# Patient Record
Sex: Female | Born: 1960 | Race: White | Hispanic: No | Smoking: Never smoker
Health system: Southern US, Community
[De-identification: ages and names within clinical notes are randomized; demographics above are authoritative.]

## PROBLEM LIST (undated history)

## (undated) DIAGNOSIS — F319 Bipolar disorder, unspecified: Secondary | ICD-10-CM

## (undated) DIAGNOSIS — K219 Gastro-esophageal reflux disease without esophagitis: Secondary | ICD-10-CM

## (undated) DIAGNOSIS — I10 Essential (primary) hypertension: Secondary | ICD-10-CM

## (undated) HISTORY — DX: Bipolar disorder, unspecified: F31.9

## (undated) HISTORY — DX: Essential (primary) hypertension: I10

## (undated) HISTORY — PX: NO PAST SURGERIES: SHX2092

## (undated) HISTORY — DX: Gastro-esophageal reflux disease without esophagitis: K21.9

---

## 1998-10-26 ENCOUNTER — Encounter: Payer: Self-pay | Admitting: Emergency Medicine

## 1998-10-26 ENCOUNTER — Emergency Department (HOSPITAL_COMMUNITY): Admission: EM | Admit: 1998-10-26 | Discharge: 1998-10-26 | Payer: Self-pay | Admitting: Emergency Medicine

## 1999-02-04 ENCOUNTER — Other Ambulatory Visit: Admission: RE | Admit: 1999-02-04 | Discharge: 1999-02-04 | Payer: Self-pay | Admitting: Obstetrics and Gynecology

## 2000-04-10 ENCOUNTER — Other Ambulatory Visit: Admission: RE | Admit: 2000-04-10 | Discharge: 2000-04-10 | Payer: Self-pay | Admitting: Obstetrics and Gynecology

## 2000-11-17 ENCOUNTER — Other Ambulatory Visit: Admission: RE | Admit: 2000-11-17 | Discharge: 2000-11-17 | Payer: Self-pay | Admitting: Obstetrics and Gynecology

## 2001-08-18 ENCOUNTER — Other Ambulatory Visit: Admission: RE | Admit: 2001-08-18 | Discharge: 2001-08-18 | Payer: Self-pay | Admitting: Obstetrics and Gynecology

## 2003-11-10 ENCOUNTER — Other Ambulatory Visit: Admission: RE | Admit: 2003-11-10 | Discharge: 2003-11-10 | Payer: Self-pay | Admitting: Obstetrics and Gynecology

## 2014-12-04 ENCOUNTER — Other Ambulatory Visit: Payer: Self-pay | Admitting: Family Medicine

## 2014-12-04 DIAGNOSIS — Z1231 Encounter for screening mammogram for malignant neoplasm of breast: Secondary | ICD-10-CM

## 2014-12-04 DIAGNOSIS — Z780 Asymptomatic menopausal state: Secondary | ICD-10-CM

## 2014-12-18 ENCOUNTER — Inpatient Hospital Stay
Admission: RE | Admit: 2014-12-18 | Discharge: 2014-12-18 | Disposition: A | Discharge status: Home / Self Care | Payer: Self-pay | Source: Ambulatory Visit | Attending: Family Medicine | Admitting: Family Medicine

## 2014-12-18 ENCOUNTER — Ambulatory Visit: Payer: Self-pay

## 2015-03-09 DIAGNOSIS — K5790 Diverticulosis of intestine, part unspecified, without perforation or abscess without bleeding: Secondary | ICD-10-CM | POA: Insufficient documentation

## 2016-07-21 DIAGNOSIS — I159 Secondary hypertension, unspecified: Secondary | ICD-10-CM | POA: Insufficient documentation

## 2016-07-21 DIAGNOSIS — J4530 Mild persistent asthma, uncomplicated: Secondary | ICD-10-CM | POA: Insufficient documentation

## 2016-07-21 DIAGNOSIS — I10 Essential (primary) hypertension: Secondary | ICD-10-CM | POA: Insufficient documentation

## 2017-02-05 DIAGNOSIS — I10 Essential (primary) hypertension: Secondary | ICD-10-CM | POA: Diagnosis not present

## 2017-02-05 DIAGNOSIS — M25561 Pain in right knee: Secondary | ICD-10-CM | POA: Diagnosis not present

## 2017-03-03 DIAGNOSIS — K219 Gastro-esophageal reflux disease without esophagitis: Secondary | ICD-10-CM | POA: Diagnosis not present

## 2017-03-03 DIAGNOSIS — I10 Essential (primary) hypertension: Secondary | ICD-10-CM | POA: Diagnosis not present

## 2017-03-03 DIAGNOSIS — D529 Folate deficiency anemia, unspecified: Secondary | ICD-10-CM | POA: Diagnosis not present

## 2017-03-03 DIAGNOSIS — Z6841 Body Mass Index (BMI) 40.0 and over, adult: Secondary | ICD-10-CM | POA: Diagnosis not present

## 2017-03-03 DIAGNOSIS — D649 Anemia, unspecified: Secondary | ICD-10-CM | POA: Diagnosis not present

## 2017-03-03 DIAGNOSIS — M25561 Pain in right knee: Secondary | ICD-10-CM | POA: Diagnosis not present

## 2017-03-03 DIAGNOSIS — D509 Iron deficiency anemia, unspecified: Secondary | ICD-10-CM | POA: Diagnosis not present

## 2017-03-03 DIAGNOSIS — R69 Illness, unspecified: Secondary | ICD-10-CM | POA: Diagnosis not present

## 2017-03-09 DIAGNOSIS — M25561 Pain in right knee: Secondary | ICD-10-CM | POA: Diagnosis not present

## 2017-03-12 DIAGNOSIS — M25561 Pain in right knee: Secondary | ICD-10-CM | POA: Diagnosis not present

## 2017-03-19 DIAGNOSIS — M25561 Pain in right knee: Secondary | ICD-10-CM | POA: Diagnosis not present

## 2017-03-25 DIAGNOSIS — M25561 Pain in right knee: Secondary | ICD-10-CM | POA: Diagnosis not present

## 2017-03-30 DIAGNOSIS — R69 Illness, unspecified: Secondary | ICD-10-CM | POA: Diagnosis not present

## 2017-03-30 DIAGNOSIS — K219 Gastro-esophageal reflux disease without esophagitis: Secondary | ICD-10-CM | POA: Diagnosis not present

## 2017-03-30 DIAGNOSIS — M25561 Pain in right knee: Secondary | ICD-10-CM | POA: Diagnosis not present

## 2017-04-01 DIAGNOSIS — Z1231 Encounter for screening mammogram for malignant neoplasm of breast: Secondary | ICD-10-CM | POA: Diagnosis not present

## 2017-04-01 DIAGNOSIS — Z124 Encounter for screening for malignant neoplasm of cervix: Secondary | ICD-10-CM | POA: Diagnosis not present

## 2017-04-01 DIAGNOSIS — Z0000 Encounter for general adult medical examination without abnormal findings: Secondary | ICD-10-CM | POA: Diagnosis not present

## 2017-04-01 DIAGNOSIS — R69 Illness, unspecified: Secondary | ICD-10-CM | POA: Diagnosis not present

## 2017-04-01 DIAGNOSIS — Z202 Contact with and (suspected) exposure to infections with a predominantly sexual mode of transmission: Secondary | ICD-10-CM | POA: Diagnosis not present

## 2017-04-02 DIAGNOSIS — M25561 Pain in right knee: Secondary | ICD-10-CM | POA: Diagnosis not present

## 2017-04-14 DIAGNOSIS — Z1231 Encounter for screening mammogram for malignant neoplasm of breast: Secondary | ICD-10-CM | POA: Diagnosis not present

## 2017-04-15 DIAGNOSIS — J0301 Acute recurrent streptococcal tonsillitis: Secondary | ICD-10-CM | POA: Diagnosis not present

## 2017-05-15 DIAGNOSIS — J00 Acute nasopharyngitis [common cold]: Secondary | ICD-10-CM | POA: Diagnosis not present

## 2017-05-18 DIAGNOSIS — G4709 Other insomnia: Secondary | ICD-10-CM | POA: Diagnosis not present

## 2017-05-18 DIAGNOSIS — J040 Acute laryngitis: Secondary | ICD-10-CM | POA: Diagnosis not present

## 2017-05-18 DIAGNOSIS — J4521 Mild intermittent asthma with (acute) exacerbation: Secondary | ICD-10-CM | POA: Diagnosis not present

## 2017-06-05 DIAGNOSIS — R69 Illness, unspecified: Secondary | ICD-10-CM | POA: Diagnosis not present

## 2017-06-05 DIAGNOSIS — G4709 Other insomnia: Secondary | ICD-10-CM | POA: Diagnosis not present

## 2017-06-08 DIAGNOSIS — R69 Illness, unspecified: Secondary | ICD-10-CM | POA: Diagnosis not present

## 2017-06-08 DIAGNOSIS — I10 Essential (primary) hypertension: Secondary | ICD-10-CM | POA: Diagnosis not present

## 2017-06-23 DIAGNOSIS — R69 Illness, unspecified: Secondary | ICD-10-CM | POA: Diagnosis not present

## 2017-07-09 DIAGNOSIS — R5383 Other fatigue: Secondary | ICD-10-CM | POA: Diagnosis not present

## 2017-07-09 DIAGNOSIS — S0003XA Contusion of scalp, initial encounter: Secondary | ICD-10-CM | POA: Diagnosis not present

## 2017-07-09 DIAGNOSIS — R0602 Shortness of breath: Secondary | ICD-10-CM | POA: Diagnosis not present

## 2017-07-09 DIAGNOSIS — R51 Headache: Secondary | ICD-10-CM | POA: Diagnosis not present

## 2017-07-09 DIAGNOSIS — S0990XA Unspecified injury of head, initial encounter: Secondary | ICD-10-CM | POA: Diagnosis not present

## 2017-07-09 DIAGNOSIS — G4751 Confusional arousals: Secondary | ICD-10-CM | POA: Diagnosis not present

## 2017-07-09 DIAGNOSIS — W01190A Fall on same level from slipping, tripping and stumbling with subsequent striking against furniture, initial encounter: Secondary | ICD-10-CM | POA: Diagnosis not present

## 2017-07-09 DIAGNOSIS — R69 Illness, unspecified: Secondary | ICD-10-CM | POA: Diagnosis not present

## 2017-07-09 DIAGNOSIS — S0101XA Laceration without foreign body of scalp, initial encounter: Secondary | ICD-10-CM | POA: Diagnosis not present

## 2017-07-09 DIAGNOSIS — R55 Syncope and collapse: Secondary | ICD-10-CM | POA: Diagnosis not present

## 2017-07-09 DIAGNOSIS — R531 Weakness: Secondary | ICD-10-CM | POA: Diagnosis not present

## 2017-07-09 DIAGNOSIS — W01198A Fall on same level from slipping, tripping and stumbling with subsequent striking against other object, initial encounter: Secondary | ICD-10-CM | POA: Diagnosis not present

## 2017-07-09 DIAGNOSIS — I10 Essential (primary) hypertension: Secondary | ICD-10-CM | POA: Diagnosis not present

## 2017-07-09 DIAGNOSIS — S0181XA Laceration without foreign body of other part of head, initial encounter: Secondary | ICD-10-CM | POA: Diagnosis not present

## 2017-07-10 DIAGNOSIS — D528 Other folate deficiency anemias: Secondary | ICD-10-CM | POA: Diagnosis not present

## 2017-07-10 DIAGNOSIS — R42 Dizziness and giddiness: Secondary | ICD-10-CM | POA: Diagnosis not present

## 2017-07-10 DIAGNOSIS — R69 Illness, unspecified: Secondary | ICD-10-CM | POA: Diagnosis not present

## 2017-07-10 DIAGNOSIS — G4739 Other sleep apnea: Secondary | ICD-10-CM | POA: Diagnosis not present

## 2017-07-10 DIAGNOSIS — S0003XD Contusion of scalp, subsequent encounter: Secondary | ICD-10-CM | POA: Diagnosis not present

## 2017-07-10 DIAGNOSIS — D508 Other iron deficiency anemias: Secondary | ICD-10-CM | POA: Diagnosis not present

## 2017-07-21 DIAGNOSIS — X58XXXD Exposure to other specified factors, subsequent encounter: Secondary | ICD-10-CM | POA: Diagnosis not present

## 2017-07-21 DIAGNOSIS — S0003XD Contusion of scalp, subsequent encounter: Secondary | ICD-10-CM | POA: Diagnosis not present

## 2017-07-21 DIAGNOSIS — R42 Dizziness and giddiness: Secondary | ICD-10-CM | POA: Diagnosis not present

## 2017-07-31 DIAGNOSIS — R69 Illness, unspecified: Secondary | ICD-10-CM | POA: Diagnosis not present

## 2017-08-06 DIAGNOSIS — G4733 Obstructive sleep apnea (adult) (pediatric): Secondary | ICD-10-CM | POA: Diagnosis not present

## 2017-08-06 DIAGNOSIS — G4739 Other sleep apnea: Secondary | ICD-10-CM | POA: Diagnosis not present

## 2017-08-31 DIAGNOSIS — R630 Anorexia: Secondary | ICD-10-CM | POA: Diagnosis not present

## 2017-08-31 DIAGNOSIS — Z23 Encounter for immunization: Secondary | ICD-10-CM | POA: Diagnosis not present

## 2017-08-31 DIAGNOSIS — R69 Illness, unspecified: Secondary | ICD-10-CM | POA: Diagnosis not present

## 2017-09-21 DIAGNOSIS — Z79899 Other long term (current) drug therapy: Secondary | ICD-10-CM | POA: Diagnosis not present

## 2017-09-21 DIAGNOSIS — N921 Excessive and frequent menstruation with irregular cycle: Secondary | ICD-10-CM | POA: Diagnosis not present

## 2017-09-21 DIAGNOSIS — R69 Illness, unspecified: Secondary | ICD-10-CM | POA: Diagnosis not present

## 2017-09-21 DIAGNOSIS — R5383 Other fatigue: Secondary | ICD-10-CM | POA: Diagnosis not present

## 2017-09-29 DIAGNOSIS — N920 Excessive and frequent menstruation with regular cycle: Secondary | ICD-10-CM | POA: Diagnosis not present

## 2017-09-29 DIAGNOSIS — N888 Other specified noninflammatory disorders of cervix uteri: Secondary | ICD-10-CM | POA: Diagnosis not present

## 2017-09-29 DIAGNOSIS — N921 Excessive and frequent menstruation with irregular cycle: Secondary | ICD-10-CM | POA: Diagnosis not present

## 2017-10-12 DIAGNOSIS — R69 Illness, unspecified: Secondary | ICD-10-CM | POA: Diagnosis not present

## 2017-10-19 DIAGNOSIS — G4733 Obstructive sleep apnea (adult) (pediatric): Secondary | ICD-10-CM | POA: Diagnosis not present

## 2017-11-11 DIAGNOSIS — I10 Essential (primary) hypertension: Secondary | ICD-10-CM | POA: Diagnosis not present

## 2017-11-11 DIAGNOSIS — R69 Illness, unspecified: Secondary | ICD-10-CM | POA: Diagnosis not present

## 2017-11-11 DIAGNOSIS — Z79899 Other long term (current) drug therapy: Secondary | ICD-10-CM | POA: Diagnosis not present

## 2017-11-18 DIAGNOSIS — G4733 Obstructive sleep apnea (adult) (pediatric): Secondary | ICD-10-CM | POA: Diagnosis not present

## 2017-11-23 DIAGNOSIS — G4733 Obstructive sleep apnea (adult) (pediatric): Secondary | ICD-10-CM | POA: Diagnosis not present

## 2017-12-17 DIAGNOSIS — R69 Illness, unspecified: Secondary | ICD-10-CM | POA: Diagnosis not present

## 2017-12-17 DIAGNOSIS — G4739 Other sleep apnea: Secondary | ICD-10-CM | POA: Diagnosis not present

## 2017-12-17 DIAGNOSIS — I10 Essential (primary) hypertension: Secondary | ICD-10-CM | POA: Diagnosis not present

## 2017-12-19 DIAGNOSIS — G4733 Obstructive sleep apnea (adult) (pediatric): Secondary | ICD-10-CM | POA: Diagnosis not present

## 2018-01-14 DIAGNOSIS — R69 Illness, unspecified: Secondary | ICD-10-CM | POA: Diagnosis not present

## 2018-01-19 DIAGNOSIS — G4733 Obstructive sleep apnea (adult) (pediatric): Secondary | ICD-10-CM | POA: Diagnosis not present

## 2018-01-27 DIAGNOSIS — R69 Illness, unspecified: Secondary | ICD-10-CM | POA: Diagnosis not present

## 2018-02-11 DIAGNOSIS — R69 Illness, unspecified: Secondary | ICD-10-CM | POA: Diagnosis not present

## 2018-02-16 DIAGNOSIS — G4733 Obstructive sleep apnea (adult) (pediatric): Secondary | ICD-10-CM | POA: Diagnosis not present

## 2018-02-19 DIAGNOSIS — Z602 Problems related to living alone: Secondary | ICD-10-CM | POA: Diagnosis not present

## 2018-02-19 DIAGNOSIS — R69 Illness, unspecified: Secondary | ICD-10-CM | POA: Diagnosis not present

## 2018-02-19 DIAGNOSIS — R7303 Prediabetes: Secondary | ICD-10-CM | POA: Diagnosis not present

## 2018-02-19 DIAGNOSIS — I10 Essential (primary) hypertension: Secondary | ICD-10-CM | POA: Diagnosis not present

## 2018-02-20 DIAGNOSIS — I10 Essential (primary) hypertension: Secondary | ICD-10-CM | POA: Diagnosis not present

## 2018-02-20 DIAGNOSIS — R7303 Prediabetes: Secondary | ICD-10-CM | POA: Diagnosis not present

## 2018-02-20 DIAGNOSIS — Z602 Problems related to living alone: Secondary | ICD-10-CM | POA: Diagnosis not present

## 2018-02-20 DIAGNOSIS — R69 Illness, unspecified: Secondary | ICD-10-CM | POA: Diagnosis not present

## 2018-02-22 DIAGNOSIS — R7303 Prediabetes: Secondary | ICD-10-CM | POA: Diagnosis not present

## 2018-02-22 DIAGNOSIS — I10 Essential (primary) hypertension: Secondary | ICD-10-CM | POA: Diagnosis not present

## 2018-02-22 DIAGNOSIS — Z602 Problems related to living alone: Secondary | ICD-10-CM | POA: Diagnosis not present

## 2018-02-22 DIAGNOSIS — R69 Illness, unspecified: Secondary | ICD-10-CM | POA: Diagnosis not present

## 2018-02-23 DIAGNOSIS — Z602 Problems related to living alone: Secondary | ICD-10-CM | POA: Diagnosis not present

## 2018-02-23 DIAGNOSIS — R69 Illness, unspecified: Secondary | ICD-10-CM | POA: Diagnosis not present

## 2018-02-23 DIAGNOSIS — I10 Essential (primary) hypertension: Secondary | ICD-10-CM | POA: Diagnosis not present

## 2018-02-23 DIAGNOSIS — R7303 Prediabetes: Secondary | ICD-10-CM | POA: Diagnosis not present

## 2018-02-24 DIAGNOSIS — R7303 Prediabetes: Secondary | ICD-10-CM | POA: Diagnosis not present

## 2018-02-24 DIAGNOSIS — I10 Essential (primary) hypertension: Secondary | ICD-10-CM | POA: Diagnosis not present

## 2018-02-24 DIAGNOSIS — R69 Illness, unspecified: Secondary | ICD-10-CM | POA: Diagnosis not present

## 2018-02-24 DIAGNOSIS — Z602 Problems related to living alone: Secondary | ICD-10-CM | POA: Diagnosis not present

## 2018-02-25 DIAGNOSIS — Z602 Problems related to living alone: Secondary | ICD-10-CM | POA: Diagnosis not present

## 2018-02-25 DIAGNOSIS — R69 Illness, unspecified: Secondary | ICD-10-CM | POA: Diagnosis not present

## 2018-02-25 DIAGNOSIS — R7303 Prediabetes: Secondary | ICD-10-CM | POA: Diagnosis not present

## 2018-02-25 DIAGNOSIS — I10 Essential (primary) hypertension: Secondary | ICD-10-CM | POA: Diagnosis not present

## 2018-02-26 DIAGNOSIS — I10 Essential (primary) hypertension: Secondary | ICD-10-CM | POA: Diagnosis not present

## 2018-02-26 DIAGNOSIS — R69 Illness, unspecified: Secondary | ICD-10-CM | POA: Diagnosis not present

## 2018-02-26 DIAGNOSIS — R7303 Prediabetes: Secondary | ICD-10-CM | POA: Diagnosis not present

## 2018-02-26 DIAGNOSIS — Z602 Problems related to living alone: Secondary | ICD-10-CM | POA: Diagnosis not present

## 2018-03-01 DIAGNOSIS — R69 Illness, unspecified: Secondary | ICD-10-CM | POA: Diagnosis not present

## 2018-03-01 DIAGNOSIS — I10 Essential (primary) hypertension: Secondary | ICD-10-CM | POA: Diagnosis not present

## 2018-03-01 DIAGNOSIS — R7303 Prediabetes: Secondary | ICD-10-CM | POA: Diagnosis not present

## 2018-03-01 DIAGNOSIS — Z602 Problems related to living alone: Secondary | ICD-10-CM | POA: Diagnosis not present

## 2018-03-02 DIAGNOSIS — Z602 Problems related to living alone: Secondary | ICD-10-CM | POA: Diagnosis not present

## 2018-03-02 DIAGNOSIS — I10 Essential (primary) hypertension: Secondary | ICD-10-CM | POA: Diagnosis not present

## 2018-03-02 DIAGNOSIS — R69 Illness, unspecified: Secondary | ICD-10-CM | POA: Diagnosis not present

## 2018-03-02 DIAGNOSIS — R7303 Prediabetes: Secondary | ICD-10-CM | POA: Diagnosis not present

## 2018-03-03 DIAGNOSIS — I10 Essential (primary) hypertension: Secondary | ICD-10-CM | POA: Diagnosis not present

## 2018-03-03 DIAGNOSIS — R69 Illness, unspecified: Secondary | ICD-10-CM | POA: Diagnosis not present

## 2018-03-03 DIAGNOSIS — R7303 Prediabetes: Secondary | ICD-10-CM | POA: Diagnosis not present

## 2018-03-03 DIAGNOSIS — Z602 Problems related to living alone: Secondary | ICD-10-CM | POA: Diagnosis not present

## 2018-03-10 DIAGNOSIS — R69 Illness, unspecified: Secondary | ICD-10-CM | POA: Diagnosis not present

## 2018-03-19 DIAGNOSIS — G4733 Obstructive sleep apnea (adult) (pediatric): Secondary | ICD-10-CM | POA: Diagnosis not present

## 2018-03-24 DIAGNOSIS — R69 Illness, unspecified: Secondary | ICD-10-CM | POA: Diagnosis not present

## 2018-03-25 DIAGNOSIS — R69 Illness, unspecified: Secondary | ICD-10-CM | POA: Diagnosis not present

## 2018-04-18 DIAGNOSIS — G4733 Obstructive sleep apnea (adult) (pediatric): Secondary | ICD-10-CM | POA: Diagnosis not present

## 2018-04-21 DIAGNOSIS — G4733 Obstructive sleep apnea (adult) (pediatric): Secondary | ICD-10-CM | POA: Diagnosis not present

## 2018-05-12 ENCOUNTER — Ambulatory Visit (INDEPENDENT_AMBULATORY_CARE_PROVIDER_SITE_OTHER): Payer: Medicare HMO | Admitting: Psychology

## 2018-05-12 DIAGNOSIS — F314 Bipolar disorder, current episode depressed, severe, without psychotic features: Secondary | ICD-10-CM | POA: Diagnosis not present

## 2018-05-12 DIAGNOSIS — R69 Illness, unspecified: Secondary | ICD-10-CM | POA: Diagnosis not present

## 2018-05-19 DIAGNOSIS — G4733 Obstructive sleep apnea (adult) (pediatric): Secondary | ICD-10-CM | POA: Diagnosis not present

## 2018-05-19 DIAGNOSIS — R69 Illness, unspecified: Secondary | ICD-10-CM | POA: Diagnosis not present

## 2018-05-20 ENCOUNTER — Ambulatory Visit: Payer: Medicare HMO | Admitting: Psychology

## 2018-05-26 DIAGNOSIS — G4733 Obstructive sleep apnea (adult) (pediatric): Secondary | ICD-10-CM | POA: Diagnosis not present

## 2018-05-27 ENCOUNTER — Ambulatory Visit (INDEPENDENT_AMBULATORY_CARE_PROVIDER_SITE_OTHER): Payer: Medicare HMO | Admitting: Psychology

## 2018-05-27 DIAGNOSIS — R69 Illness, unspecified: Secondary | ICD-10-CM | POA: Diagnosis not present

## 2018-05-27 DIAGNOSIS — F314 Bipolar disorder, current episode depressed, severe, without psychotic features: Secondary | ICD-10-CM | POA: Diagnosis not present

## 2018-06-03 ENCOUNTER — Ambulatory Visit (INDEPENDENT_AMBULATORY_CARE_PROVIDER_SITE_OTHER): Payer: Medicare HMO | Admitting: Psychology

## 2018-06-03 DIAGNOSIS — F314 Bipolar disorder, current episode depressed, severe, without psychotic features: Secondary | ICD-10-CM

## 2018-06-03 DIAGNOSIS — R69 Illness, unspecified: Secondary | ICD-10-CM | POA: Diagnosis not present

## 2018-06-11 ENCOUNTER — Ambulatory Visit: Payer: Medicare HMO | Admitting: Psychology

## 2018-06-18 ENCOUNTER — Ambulatory Visit: Payer: Self-pay | Admitting: Psychology

## 2018-06-18 DIAGNOSIS — G4733 Obstructive sleep apnea (adult) (pediatric): Secondary | ICD-10-CM | POA: Diagnosis not present

## 2018-06-24 DIAGNOSIS — R69 Illness, unspecified: Secondary | ICD-10-CM | POA: Diagnosis not present

## 2018-07-19 DIAGNOSIS — G4733 Obstructive sleep apnea (adult) (pediatric): Secondary | ICD-10-CM | POA: Diagnosis not present

## 2018-08-19 DIAGNOSIS — G4733 Obstructive sleep apnea (adult) (pediatric): Secondary | ICD-10-CM | POA: Diagnosis not present

## 2018-10-07 DIAGNOSIS — G4733 Obstructive sleep apnea (adult) (pediatric): Secondary | ICD-10-CM | POA: Diagnosis not present

## 2018-11-25 DIAGNOSIS — Z23 Encounter for immunization: Secondary | ICD-10-CM | POA: Diagnosis not present

## 2018-12-15 DIAGNOSIS — Z6841 Body Mass Index (BMI) 40.0 and over, adult: Secondary | ICD-10-CM | POA: Diagnosis not present

## 2018-12-15 DIAGNOSIS — I10 Essential (primary) hypertension: Secondary | ICD-10-CM | POA: Diagnosis not present

## 2018-12-15 DIAGNOSIS — R42 Dizziness and giddiness: Secondary | ICD-10-CM | POA: Diagnosis not present

## 2018-12-15 DIAGNOSIS — R69 Illness, unspecified: Secondary | ICD-10-CM | POA: Diagnosis not present

## 2018-12-30 DIAGNOSIS — Z1231 Encounter for screening mammogram for malignant neoplasm of breast: Secondary | ICD-10-CM | POA: Diagnosis not present

## 2018-12-30 DIAGNOSIS — Z0000 Encounter for general adult medical examination without abnormal findings: Secondary | ICD-10-CM | POA: Diagnosis not present

## 2018-12-30 DIAGNOSIS — Z6841 Body Mass Index (BMI) 40.0 and over, adult: Secondary | ICD-10-CM | POA: Diagnosis not present

## 2019-01-25 DIAGNOSIS — Z1231 Encounter for screening mammogram for malignant neoplasm of breast: Secondary | ICD-10-CM | POA: Diagnosis not present

## 2019-01-25 LAB — HM MAMMOGRAPHY: HM Mammogram: NORMAL (ref 0–4)

## 2019-03-01 DIAGNOSIS — L247 Irritant contact dermatitis due to plants, except food: Secondary | ICD-10-CM | POA: Diagnosis not present

## 2019-03-01 DIAGNOSIS — R601 Generalized edema: Secondary | ICD-10-CM | POA: Diagnosis not present

## 2019-04-28 DIAGNOSIS — G4733 Obstructive sleep apnea (adult) (pediatric): Secondary | ICD-10-CM | POA: Diagnosis not present

## 2019-07-03 ENCOUNTER — Emergency Department (HOSPITAL_COMMUNITY): Payer: Medicare HMO

## 2019-07-03 ENCOUNTER — Emergency Department (HOSPITAL_COMMUNITY)
Admission: EM | Admit: 2019-07-03 | Discharge: 2019-07-05 | Disposition: A | Discharge status: Other Acute Inpatient Hospital | Payer: Medicare HMO | Attending: Emergency Medicine | Admitting: Emergency Medicine

## 2019-07-03 DIAGNOSIS — H9190 Unspecified hearing loss, unspecified ear: Secondary | ICD-10-CM | POA: Insufficient documentation

## 2019-07-03 DIAGNOSIS — R4182 Altered mental status, unspecified: Secondary | ICD-10-CM | POA: Diagnosis not present

## 2019-07-03 DIAGNOSIS — R404 Transient alteration of awareness: Secondary | ICD-10-CM | POA: Diagnosis not present

## 2019-07-03 DIAGNOSIS — I639 Cerebral infarction, unspecified: Secondary | ICD-10-CM | POA: Diagnosis not present

## 2019-07-03 DIAGNOSIS — F309 Manic episode, unspecified: Secondary | ICD-10-CM | POA: Insufficient documentation

## 2019-07-03 DIAGNOSIS — R479 Unspecified speech disturbances: Secondary | ICD-10-CM

## 2019-07-03 DIAGNOSIS — G9340 Encephalopathy, unspecified: Secondary | ICD-10-CM | POA: Diagnosis not present

## 2019-07-03 DIAGNOSIS — J45909 Unspecified asthma, uncomplicated: Secondary | ICD-10-CM | POA: Insufficient documentation

## 2019-07-03 DIAGNOSIS — I10 Essential (primary) hypertension: Secondary | ICD-10-CM | POA: Insufficient documentation

## 2019-07-03 DIAGNOSIS — R4701 Aphasia: Secondary | ICD-10-CM | POA: Diagnosis not present

## 2019-07-03 DIAGNOSIS — R000 Tachycardia, unspecified: Secondary | ICD-10-CM | POA: Diagnosis not present

## 2019-07-03 DIAGNOSIS — R69 Illness, unspecified: Secondary | ICD-10-CM | POA: Diagnosis not present

## 2019-07-03 DIAGNOSIS — R4781 Slurred speech: Secondary | ICD-10-CM | POA: Diagnosis not present

## 2019-07-03 LAB — DIFFERENTIAL
Abs Immature Granulocytes: 0.03 10*3/uL (ref 0.00–0.07)
Basophils Absolute: 0.1 10*3/uL (ref 0.0–0.1)
Basophils Relative: 1 %
Eosinophils Absolute: 0 10*3/uL (ref 0.0–0.5)
Eosinophils Relative: 0 %
Immature Granulocytes: 0 %
Lymphocytes Relative: 19 %
Lymphs Abs: 1.7 10*3/uL (ref 0.7–4.0)
Monocytes Absolute: 0.6 10*3/uL (ref 0.1–1.0)
Monocytes Relative: 7 %
Neutro Abs: 6.3 10*3/uL (ref 1.7–7.7)
Neutrophils Relative %: 73 %

## 2019-07-03 LAB — CBC
HCT: 40.8 % (ref 36.0–46.0)
Hemoglobin: 13.4 g/dL (ref 12.0–15.0)
MCH: 29 pg (ref 26.0–34.0)
MCHC: 32.8 g/dL (ref 30.0–36.0)
MCV: 88.3 fL (ref 80.0–100.0)
Platelets: 381 10*3/uL (ref 150–400)
RBC: 4.62 MIL/uL (ref 3.87–5.11)
RDW: 13.5 % (ref 11.5–15.5)
WBC: 8.7 10*3/uL (ref 4.0–10.5)
nRBC: 0 % (ref 0.0–0.2)

## 2019-07-03 LAB — COMPREHENSIVE METABOLIC PANEL
ALT: 22 U/L (ref 0–44)
AST: 26 U/L (ref 15–41)
Albumin: 3.9 g/dL (ref 3.5–5.0)
Alkaline Phosphatase: 59 U/L (ref 38–126)
Anion gap: 14 (ref 5–15)
BUN: 21 mg/dL — ABNORMAL HIGH (ref 6–20)
CO2: 24 mmol/L (ref 22–32)
Calcium: 9.8 mg/dL (ref 8.9–10.3)
Chloride: 105 mmol/L (ref 98–111)
Creatinine, Ser: 1.19 mg/dL — ABNORMAL HIGH (ref 0.44–1.00)
GFR calc Af Amer: 60 mL/min — ABNORMAL LOW (ref >60)
GFR calc non Af Amer: 51 mL/min — ABNORMAL LOW (ref >60)
Glucose, Bld: 132 mg/dL — ABNORMAL HIGH (ref 70–99)
Potassium: 3 mmol/L — ABNORMAL LOW (ref 3.5–5.1)
Sodium: 143 mmol/L (ref 135–145)
Total Bilirubin: 1.3 mg/dL — ABNORMAL HIGH (ref 0.3–1.2)
Total Protein: 7.3 g/dL (ref 6.5–8.1)

## 2019-07-03 LAB — URINALYSIS, ROUTINE W REFLEX MICROSCOPIC
Bacteria, UA: NONE SEEN
Bilirubin Urine: NEGATIVE
Glucose, UA: NEGATIVE mg/dL
Hgb urine dipstick: NEGATIVE
Ketones, ur: 20 mg/dL — AB
Leukocytes,Ua: NEGATIVE
Nitrite: NEGATIVE
Protein, ur: 30 mg/dL — AB
Specific Gravity, Urine: 1.028 (ref 1.005–1.030)
pH: 5 (ref 5.0–8.0)

## 2019-07-03 LAB — ETHANOL: Alcohol, Ethyl (B): <10 mg/dL (ref <10)

## 2019-07-03 LAB — PROTIME-INR
INR: 1 (ref 0.8–1.2)
Prothrombin Time: 13.1 seconds (ref 11.4–15.2)

## 2019-07-03 LAB — RAPID URINE DRUG SCREEN, HOSP PERFORMED
Amphetamines: NOT DETECTED
Barbiturates: NOT DETECTED
Benzodiazepines: NOT DETECTED
Cocaine: NOT DETECTED
Opiates: NOT DETECTED
Tetrahydrocannabinol: NOT DETECTED

## 2019-07-03 LAB — AMMONIA: Ammonia: 30 umol/L (ref 9–35)

## 2019-07-03 LAB — I-STAT CHEM 8, ED
BUN: 22 mg/dL — ABNORMAL HIGH (ref 6–20)
Calcium, Ion: 1.1 mmol/L — ABNORMAL LOW (ref 1.15–1.40)
Chloride: 104 mmol/L (ref 98–111)
Creatinine, Ser: 1.1 mg/dL — ABNORMAL HIGH (ref 0.44–1.00)
Glucose, Bld: 130 mg/dL — ABNORMAL HIGH (ref 70–99)
HCT: 40 % (ref 36.0–46.0)
Hemoglobin: 13.6 g/dL (ref 12.0–15.0)
Potassium: 3 mmol/L — ABNORMAL LOW (ref 3.5–5.1)
Sodium: 141 mmol/L (ref 135–145)
TCO2: 26 mmol/L (ref 22–32)

## 2019-07-03 LAB — CBG MONITORING, ED: Glucose-Capillary: 129 mg/dL — ABNORMAL HIGH (ref 70–99)

## 2019-07-03 LAB — I-STAT BETA HCG BLOOD, ED (MC, WL, AP ONLY): I-stat hCG, quantitative: <5 m[IU]/mL (ref <5)

## 2019-07-03 LAB — MAGNESIUM: Magnesium: 2.1 mg/dL (ref 1.7–2.4)

## 2019-07-03 LAB — TSH: TSH: 1.043 u[IU]/mL (ref 0.350–4.500)

## 2019-07-03 LAB — APTT: aPTT: 28 seconds (ref 24–36)

## 2019-07-03 MED ORDER — POTASSIUM CHLORIDE CRYS ER 20 MEQ PO TBCR
40.0000 meq | EXTENDED_RELEASE_TABLET | Freq: Once | ORAL | Status: AC
  Administered 2019-07-03: 40 meq via ORAL
  Filled 2019-07-03: qty 2

## 2019-07-03 MED ORDER — POTASSIUM CHLORIDE CRYS ER 20 MEQ PO TBCR
20.0000 meq | EXTENDED_RELEASE_TABLET | Freq: Every day | ORAL | Status: AC
  Administered 2019-07-04 – 2019-07-05 (×2): 20 meq via ORAL
  Filled 2019-07-03 (×3): qty 1

## 2019-07-03 MED ORDER — LORAZEPAM 2 MG/ML IJ SOLN
2.0000 mg | Freq: Once | INTRAMUSCULAR | Status: AC
  Administered 2019-07-03: 2 mg via INTRAVENOUS
  Filled 2019-07-03: qty 1

## 2019-07-03 MED ORDER — HYDROMORPHONE HCL 1 MG/ML IJ SOLN
1.0000 mg | Freq: Once | INTRAMUSCULAR | Status: DC
  Filled 2019-07-03: qty 1

## 2019-07-03 MED ORDER — SODIUM CHLORIDE 0.9 % IV BOLUS
1000.0000 mL | Freq: Once | INTRAVENOUS | Status: AC
  Administered 2019-07-03: 1000 mL via INTRAVENOUS

## 2019-07-03 MED ORDER — SODIUM CHLORIDE 0.9% FLUSH
3.0000 mL | Freq: Once | INTRAVENOUS | Status: DC
Start: 2019-07-03 — End: 2019-07-05

## 2019-07-03 MED ORDER — HYDROMORPHONE HCL 1 MG/ML IJ SOLN: 0.5000 mg | Freq: Once | INTRAMUSCULAR | Status: DC

## 2019-07-03 MED ORDER — POTASSIUM CHLORIDE 10 MEQ/100ML IV SOLN
10.0000 meq | INTRAVENOUS | Status: AC
  Administered 2019-07-03: 10 meq via INTRAVENOUS
  Filled 2019-07-03: qty 100

## 2019-07-03 MED ORDER — LACTATED RINGERS IV BOLUS
1000.0000 mL | Freq: Once | INTRAVENOUS | Status: AC
  Administered 2019-07-03: 1000 mL via INTRAVENOUS

## 2019-07-03 MED ORDER — POTASSIUM CHLORIDE 10 MEQ/100ML IV SOLN
10.0000 meq | Freq: Once | INTRAVENOUS | Status: AC
  Administered 2019-07-04: 10 meq via INTRAVENOUS
  Filled 2019-07-03: qty 100

## 2019-07-03 NOTE — ED Notes (Signed)
[phone] pts daughter Rip Harbour, had a missed call wanting a pt update

## 2019-07-03 NOTE — Progress Notes (Signed)
Brought patient to MRI department for scan, patient at this time says she cannot do MRI. She is having bad cramps in right leg and kept saying she doesn't want to do this right. Informed RN, patient sent back to room

## 2019-07-03 NOTE — ED Notes (Signed)
Pt is not in room at this moment. Will return for blood draw.

## 2019-07-03 NOTE — ED Notes (Signed)
Called main lab to add RPR, and Mag
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
## 2019-07-03 NOTE — ED Notes (Signed)
Patient continuously removing self from monitor.

## 2019-07-04 ENCOUNTER — Other Ambulatory Visit: Payer: Self-pay

## 2019-07-04 LAB — RPR: RPR Ser Ql: NONREACTIVE

## 2019-07-04 NOTE — ED Notes (Signed)
Daughter updated on phone. States pts PCP Metta Clines [phone]

## 2019-07-04 NOTE — ED Notes (Signed)
Pt daughter Bradly Chris  [phone]

## 2019-07-04 NOTE — ED Notes (Signed)
Called staffing about a Air cabin crew and they said that they wouldn't have one for the whole day.

## 2019-07-04 NOTE — ED Notes (Signed)
Pt daughter Monica Martinez [phone]

## 2019-07-04 NOTE — BH Assessment (Signed)
Cynthia [name] Assessment Progress Note This Probation officer spoke with patient this date to evlaute current mental health status. Patient presents with a disorganized affect and is not oriented to time or place. Patient is speaking incoherently and is difficult to redirect. Case was staffed with Marcello Moores NP recommended a continued inpatient admission.
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
## 2019-07-04 NOTE — Progress Notes (Addendum)
MRI brain completed: 1. No acute intracranial abnormality identified. 2. Empty sella. While this finding is often incidental finding of no clinical significance, this can also be seen in the setting of elevated intracranial pressures (pseudotumor cerebri). 3. Otherwise unremarkable and normal brain MRI.  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 MRI brain is negative for stroke 2. Recommend psychiatry/psychology evaluations.  3. Neurology will sign off. Please call if there are additional questions.  Electronically signed: Dr. Kerney Elbe

## 2019-07-04 NOTE — Progress Notes (Signed)
Pt meets inpatient criteria per Mordecai Maes, NP. Referral information has been sent to the following hospitals for review:  [facility]  Disposition will continue to follow for inpatient placement needs.   Audree Camel, LCSW, [place] Disposition [facility]/TTS [phone]

## 2019-07-04 NOTE — ED Notes (Signed)
Pt is restless, moving in and out of bed. Redirected back to bed.

## 2019-07-04 NOTE — ED Notes (Signed)
Dinner tray ordered.

## 2019-07-04 NOTE — ED Notes (Signed)
Breakfast ordered 

## 2019-07-05 DIAGNOSIS — Z9114 Patient's other noncompliance with medication regimen: Secondary | ICD-10-CM | POA: Diagnosis not present

## 2019-07-05 DIAGNOSIS — I10 Essential (primary) hypertension: Secondary | ICD-10-CM | POA: Diagnosis not present

## 2019-07-05 DIAGNOSIS — F309 Manic episode, unspecified: Secondary | ICD-10-CM | POA: Diagnosis not present

## 2019-07-05 DIAGNOSIS — R69 Illness, unspecified: Secondary | ICD-10-CM | POA: Diagnosis not present

## 2019-07-05 MED ORDER — ZIPRASIDONE MESYLATE 20 MG IM SOLR
20.0000 mg | Freq: Once | INTRAMUSCULAR | Status: AC
  Administered 2019-07-05: 20 mg via INTRAMUSCULAR
  Filled 2019-07-05: qty 20

## 2019-07-05 NOTE — ED Notes (Signed)
Pt wandering in hall; stating she is looking for the exit. Pt provided with a recliner chair and reminded that she has to stay to speak with dr in a.m. Pt redirected. Will continue to monitor.

## 2019-07-05 NOTE — ED Provider Notes (Addendum)
IVC has been placed.  Patient recommended for inpatient psychiatric treatment.  Patient with new manic behavior.  Likely has underlying bipolar.  To be admitted at [facility].  Patient stable throughout my care.  Excepted to [facility] by Dr. Versie Starks. Transferred in good condition.   Lennice Sites, DO 07/05/19 Jane Lew, [place], DO 07/05/19 1343

## 2019-07-05 NOTE — ED Notes (Signed)
Patient has attempted to walk to ambulance bay exit a couple of times, wanders in the hall, etc. Pt advised to return to seat, again.

## 2019-07-05 NOTE — ED Notes (Signed)
Called [facility] transport to be [facility]

## 2019-07-05 NOTE — ED Notes (Signed)
Pt. Will continue to repeat whatever she hears and sees. Have provided crayons and 3 pieces of paper to stay occupied.

## 2019-07-05 NOTE — Progress Notes (Signed)
[facility] Romie Jumper, MD is the accepting/attending provider   Call report to [phone] [facility] ED notified.   Pt is to be IVC'd prior to transport as she has been attempting to elope. PLEASE FAX IVC TO [facility] @[phone]  Pt may be transported by [employer] Pt scheduled  to arrive at [facility] as soon as IVC is served and transport arranged.  Areatha Keas. Judi Cong, MSW, [place] Disposition Clinical Social Work [phone] (cell) [phone] (office)

## 2019-07-05 NOTE — ED Notes (Signed)
Patient provided with paper and crayons to keep busy/distracted from wandering with no success.

## 2019-07-05 NOTE — ED Notes (Signed)
Pt resting with eyes closed; placed on ED stretcher for comfort.

## 2019-07-05 NOTE — ED Notes (Signed)
Pts breakfast has arrived. Will continue to monitor pt 

## 2019-07-05 NOTE — ED Notes (Signed)
Patient awake; wandering up the hall. Redirected back to bed

## 2019-07-05 NOTE — ED Notes (Signed)
Pt. Began crying. When asked what was wrong, she raised her hands to cover her ears and said "amber alert". Will continue to monitor pt.

## 2019-07-14 DIAGNOSIS — R69 Illness, unspecified: Secondary | ICD-10-CM | POA: Diagnosis not present

## 2019-07-20 DIAGNOSIS — R69 Illness, unspecified: Secondary | ICD-10-CM | POA: Diagnosis not present

## 2019-07-27 DIAGNOSIS — R600 Localized edema: Secondary | ICD-10-CM | POA: Diagnosis not present

## 2019-08-22 DIAGNOSIS — R69 Illness, unspecified: Secondary | ICD-10-CM | POA: Diagnosis not present

## 2019-08-22 DIAGNOSIS — R600 Localized edema: Secondary | ICD-10-CM | POA: Diagnosis not present

## 2019-08-22 DIAGNOSIS — Z23 Encounter for immunization: Secondary | ICD-10-CM | POA: Diagnosis not present

## 2019-08-25 DIAGNOSIS — R7301 Impaired fasting glucose: Secondary | ICD-10-CM | POA: Diagnosis not present

## 2019-09-15 DIAGNOSIS — R69 Illness, unspecified: Secondary | ICD-10-CM | POA: Diagnosis not present

## 2019-10-05 DIAGNOSIS — R69 Illness, unspecified: Secondary | ICD-10-CM | POA: Diagnosis not present

## 2019-10-25 DIAGNOSIS — R69 Illness, unspecified: Secondary | ICD-10-CM | POA: Diagnosis not present

## 2019-12-13 DIAGNOSIS — R69 Illness, unspecified: Secondary | ICD-10-CM | POA: Diagnosis not present

## 2020-01-03 DIAGNOSIS — Z6841 Body Mass Index (BMI) 40.0 and over, adult: Secondary | ICD-10-CM | POA: Diagnosis not present

## 2020-01-03 DIAGNOSIS — Z0000 Encounter for general adult medical examination without abnormal findings: Secondary | ICD-10-CM | POA: Diagnosis not present

## 2020-01-04 IMAGING — CT CT HEAD CODE STROKE W/O CM
3 of 4 series · 13 of 47 positions shown, 15 images · non-contrast
Comparison: CT head without contrast 07/09/2017. MRI brain
07/21/2017

CLINICAL DATA: Code stroke. Aphasia. Last seen normal is un known.

EXAM:
CT HEAD WITHOUT CONTRAST
TECHNIQUE: Contiguous axial images were obtained from the base of the skull
through the vertex without intravenous contrast.

[Series 3: head wo · axial · 0.52mm/px · z∈[-130,-10]mm · 7 of 32 slices shown, 9 images]
[im 4/32  brain]
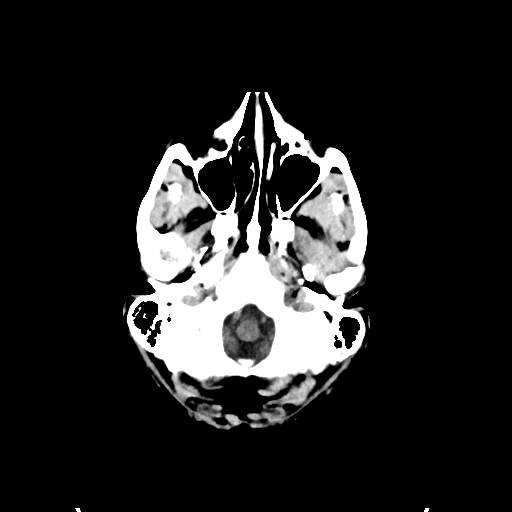
[im 4/32  bone]
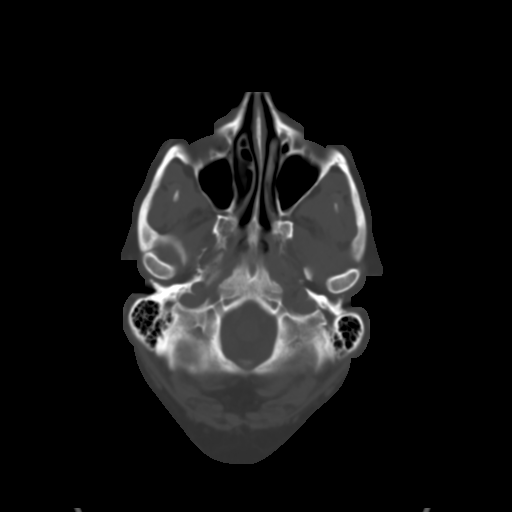
[im 8/32  brain]
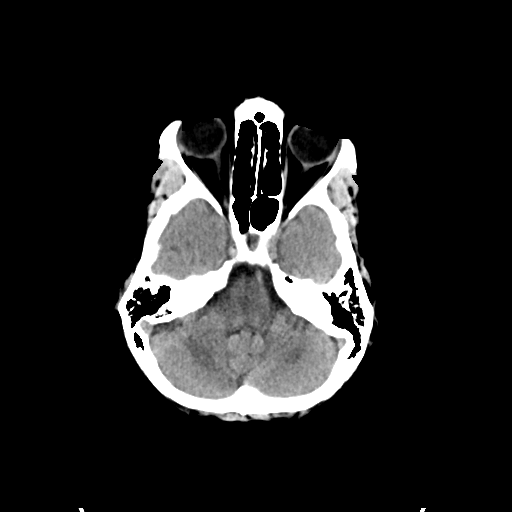
[im 12/32  brain]
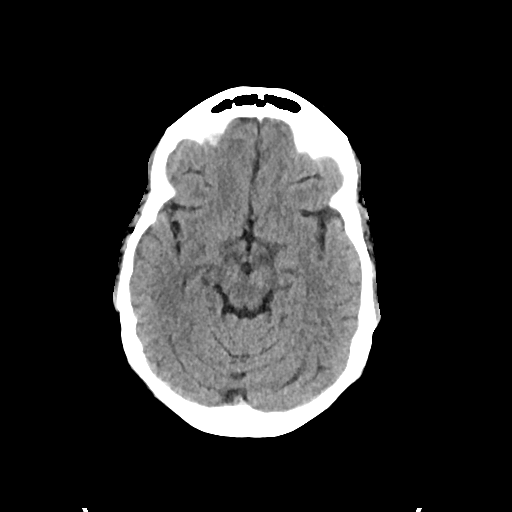
[im 16/32  brain]
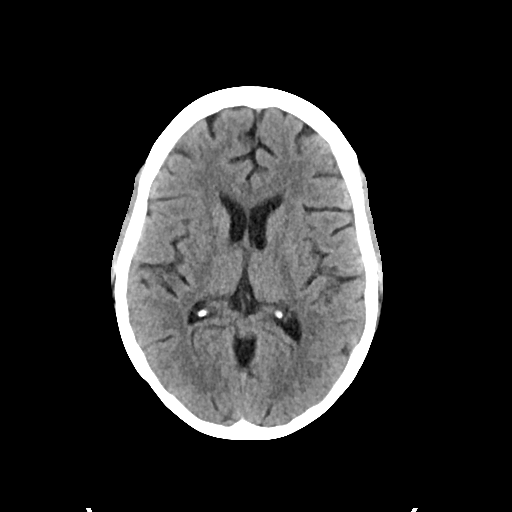
[im 20/32  brain]
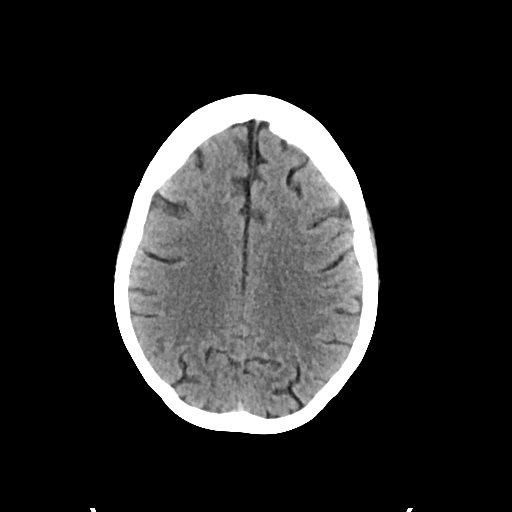
[im 20/32  bone]
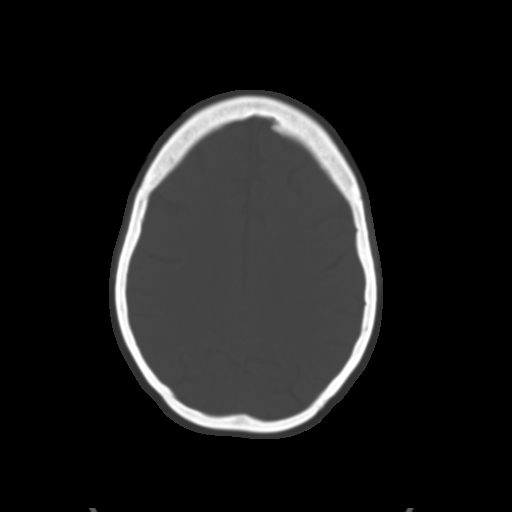
[im 24/32  brain]
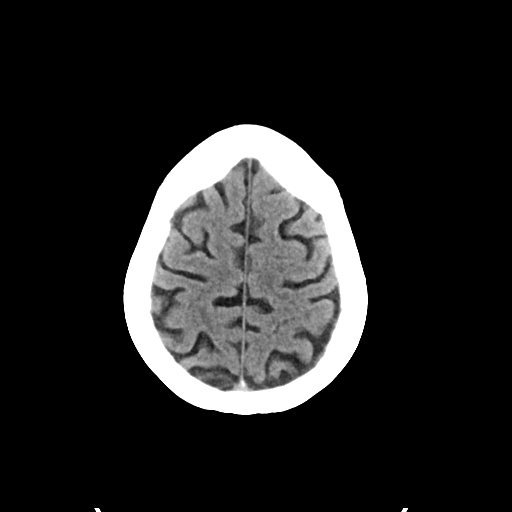
[im 28/32  brain]
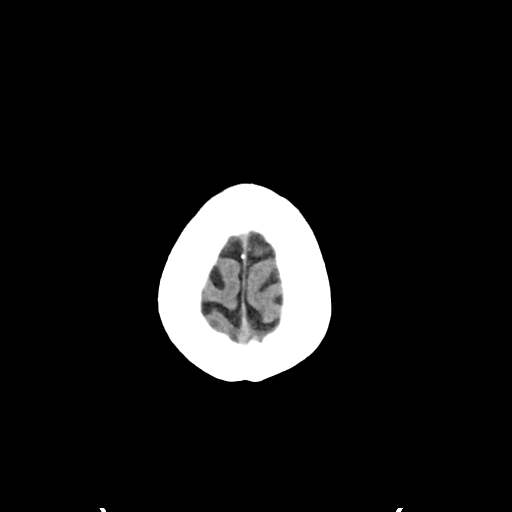

[Series 5: cor soft · coronal · 0.31mm/px · 3 of 67 slices shown]
[im 23/67  brain]
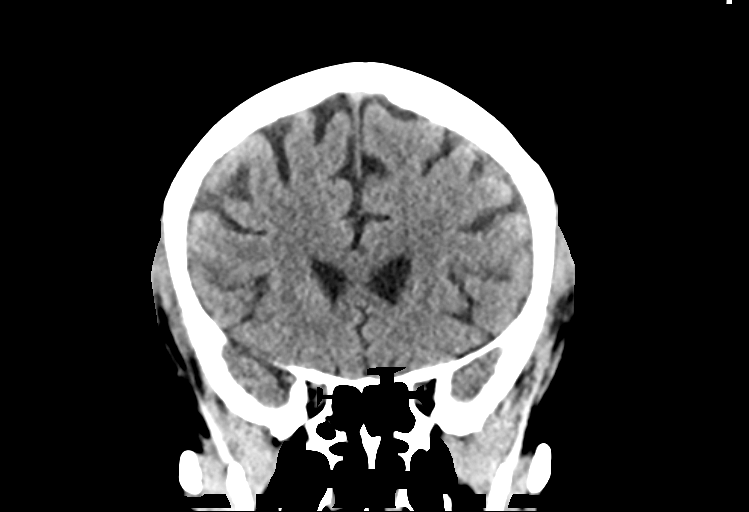
[im 30/67  brain]
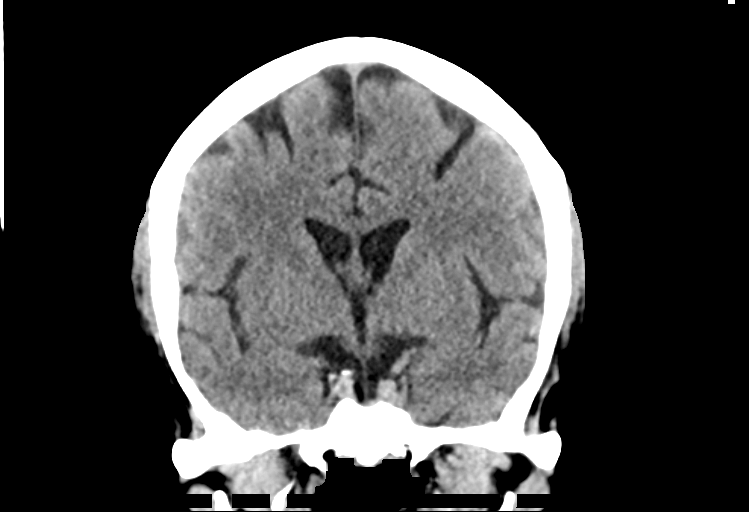
[im 37/67  brain]
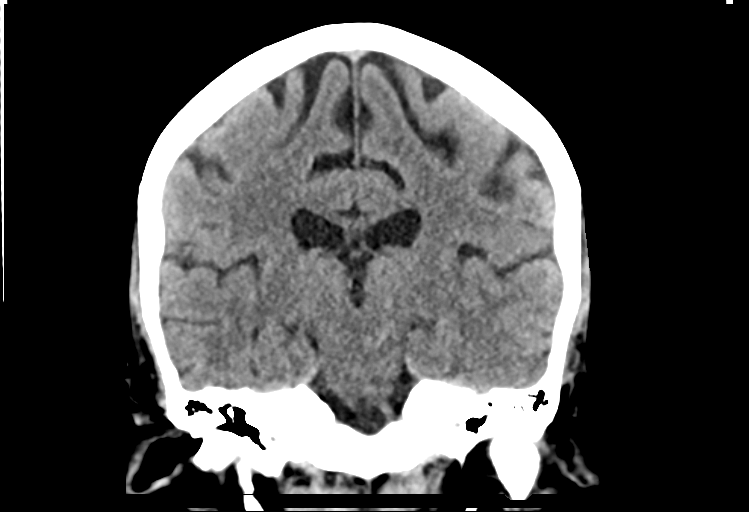

[Series 6: sag soft · sagittal · 0.31mm/px · 3 of 66 slices shown]
[im 22/66  brain]
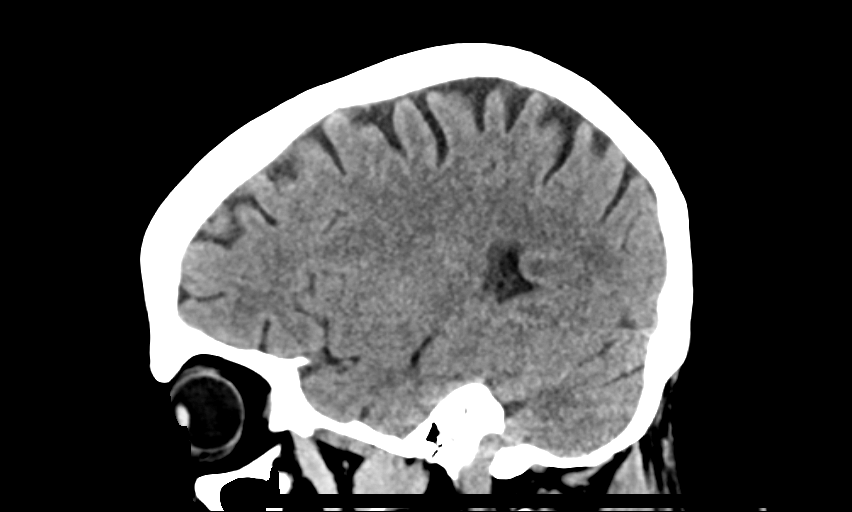
[im 33/66  brain]
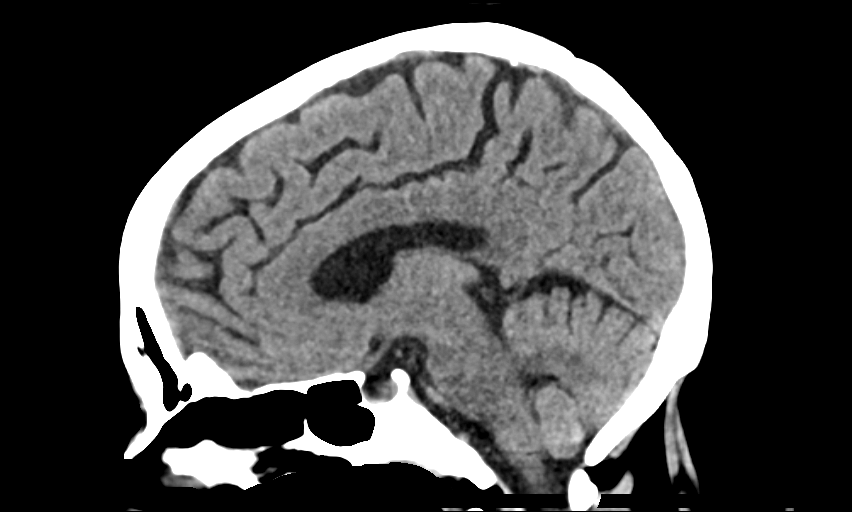
[im 44/66  brain]
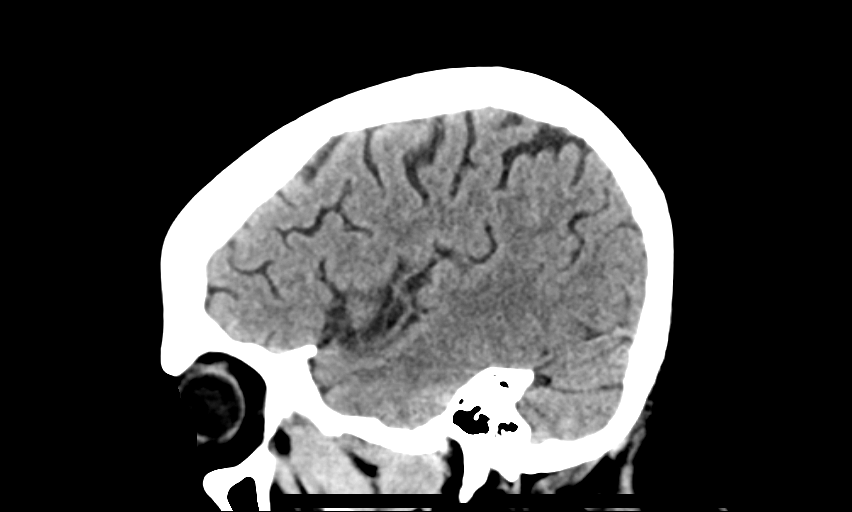

[13 of 47 positions shown; findings below may reference images not displayed]

FINDINGS: Brain: No acute infarct, hemorrhage, or mass lesion is present. The
ventricles are of normal size. No significant white matter lesions
are present. No significant extraaxial fluid collection is present.
The brainstem and cerebellum are within normal limits.

Vascular: No hyperdense vessel or unexpected calcification.

Skull: Calvarium is intact. No focal lytic or blastic lesions are
present.

Sinuses/Orbits: The paranasal sinuses and mastoid air cells are
clear. The globes and orbits are within normal limits.

ASPECTS (Alberta Stroke Program Early CT Score)

- Ganglionic level infarction (caudate, lentiform nuclei, internal
capsule, insula, M1-M3 cortex): [DATE]

- Supraganglionic infarction (M4-M6 cortex): [DATE]

Total score (0-10 with 10 being normal): [DATE]
IMPRESSION: 1. Normal CT appearance of the brain. No acute intracranial
abnormality
2. ASPECTS is [DATE]

The above was relayed via text pager to Dr. DIKA on 07/03/2019 at

## 2020-02-17 ENCOUNTER — Encounter: Payer: Self-pay | Admitting: Family Medicine

## 2020-02-17 NOTE — Progress Notes (Signed)
Established Patient Office Visit  Subjective:  Patient ID: Cynthia [name], female    DOB: [birth year]  Age: 59 y.o. MRN: [id number]  CC:  Chief Complaint  Patient presents with  . Hypertension  . Gastroesophageal Reflux    HPI Cynthia [name] presents for follow up hypertension and swelling in legs, but has resolved with hctz and furosemide.   Compression socks, nor had low salt diet alone helped.   Lab work was last done in September 2020.  At that time she did have elevated cholesterol mildly and a slightly elevated sugar.  Her A1c at that time was 5.6.  She was mildly anemic and was supposed to follow-up for recheck on her hemoglobin but never did. She suffers from morbid (severe) obesity due to excess calories.  The course has been stable and nonprogressive.  It is of severe intensity.  Poor diet and very little exercise.  With regard to the bipolar disorder, current episode depressed, mild, this is follow-up of bipolar disorder.  The diagnosis of bipolar disorder was made 10 plus years ago.  Presently, she feels a mild degree of depression.  She receives her care from [facility].  Current medications include depakote, abilify, and risperdone. Psychiatric history is significant for prior depressive episodes (multiple times previously), prior suicide attempt, prior psychiatric hospitalizations (twice), bipolar disorder, and manic attacks.   Patient has managed her GERD with diet.   Past Medical History:  Diagnosis Date  . Bipolar 1 disorder ([place])   . Essential hypertension   . GERD (gastroesophageal reflux disease)     Family History  Problem Relation Age of Onset  . Cancer Mother        lung  . Cancer Father        prostate  . Diabetes Paternal Grandfather     Social History   Socioeconomic History  . Marital status: Divorced    Spouse name: Not on file  . Number of children: 3  . Years of education: Not on file  . Highest education level: Not on file    Occupational History  . Not on file  Tobacco Use  . Smoking status: Never Smoker  . Smokeless tobacco: Never Used  Substance and Sexual Activity  . Alcohol use: Yes    Comment: rarely  . Drug use: Never  . Sexual activity: Not on file  Other Topics Concern  . Not on file  Social History Narrative  . Not on file   Social Determinants of Health   Financial Resource Strain:   . Difficulty of Paying Living Expenses:   Food Insecurity:   . Worried About Charity fundraiser in the Last Year:   . Arboriculturist in the Last Year:   Transportation Needs:   . Film/video editor (Medical):   Marland Kitchen Lack of Transportation (Non-Medical):   Physical Activity:   . Days of Exercise per Week:   . Minutes of Exercise per Session:   Stress:   . Feeling of Stress :   Social Connections:   . Frequency of Communication with Friends and Family:   . Frequency of Social Gatherings with Friends and Family:   . Attends Religious Services:   . Active Member of Clubs or Organizations:   . Attends Archivist Meetings:   Marland Kitchen Marital Status:   Intimate Partner Violence:   . Fear of Current or Ex-Partner:   . Emotionally Abused:   Marland Kitchen Physically Abused:   . Sexually  Abused:     Outpatient Medications Prior to Visit  Medication Sig Dispense Refill  . albuterol (VENTOLIN HFA) 108 (90 Base) MCG/ACT inhaler Inhale 1-2 puffs into the lungs every 4 (four) hours as needed for wheezing or shortness of breath.    . ARIPiprazole (ABILIFY) 5 MG tablet Take 5 mg by mouth every morning.    . divalproex (DEPAKOTE) 500 MG DR tablet Take 500 mg by mouth 2 (two) times daily.    . furosemide (LASIX) 20 MG tablet Take 20 mg by mouth daily.    . hydrochlorothiazide (HYDRODIURIL) 25 MG tablet Take 25 mg by mouth daily.    Marland Kitchen imipramine (TOFRANIL) 50 MG tablet Take 150 mg by mouth at bedtime.    . risperiDONE (RISPERDAL) 3 MG tablet Take 6 mg by mouth at bedtime.    . meclizine (ANTIVERT) 25 MG tablet Take 25  mg by mouth.     No facility-administered medications prior to visit.    No Known Allergies  ROS Review of Systems  Constitutional: Negative for chills, fatigue and fever.  HENT: Negative for congestion, ear pain, rhinorrhea and sore throat.   Respiratory: Negative for cough and shortness of breath.   Cardiovascular: Negative for chest pain.  Gastrointestinal: Negative for abdominal pain, constipation, diarrhea, nausea and vomiting.  Genitourinary: Negative for dysuria and urgency.  Musculoskeletal: Negative for back pain and myalgias.  Neurological: Negative for dizziness, weakness, light-headedness and headaches.  Psychiatric/Behavioral: Negative for dysphoric mood. The patient is not nervous/anxious.       Objective:    Physical Exam  Constitutional: Vital signs are normal. She appears well-developed and well-nourished.  Obese.   Cardiovascular: Normal rate, regular rhythm and normal heart sounds.  Pulmonary/Chest: Effort normal and breath sounds normal.  Musculoskeletal:        General: No edema.  Neurological: She is alert.  Psychiatric: She has a normal mood and affect. Her behavior is normal.    BP 124/72   Pulse 100   Temp (!) 96.2 F (35.7 C)   Resp 18   Ht 5\' 3"  (1.6 m)   Wt 243 lb (110.2 kg)   BMI 43.05 kg/m  Wt Readings from Last 3 Encounters:  02/20/20 243 lb (110.2 kg)  07/03/19 237 lb 7 oz (107.7 kg)     Health Maintenance Due  Topic Date Due  . Hepatitis C Screening  Never done  . HIV Screening  Never done  . TETANUS/TDAP  Never done  . PAP SMEAR-Modifier  Never done    There are no preventive care reminders to display for this patient.  Lab Results  Component Value Date   TSH 1.043 07/03/2019   Lab Results  Component Value Date   WBC 8.7 07/03/2019   HGB 13.6 07/03/2019   HCT 40.0 07/03/2019   MCV 88.3 07/03/2019   PLT 381 07/03/2019   Lab Results  Component Value Date   NA 141 07/03/2019   K 3.0 (L) 07/03/2019   CO2 24  07/03/2019   GLUCOSE 130 (H) 07/03/2019   BUN 22 (H) 07/03/2019   CREATININE 1.10 (H) 07/03/2019   BILITOT 1.3 (H) 07/03/2019   ALKPHOS 59 07/03/2019   AST 26 07/03/2019   ALT 22 07/03/2019   PROT 7.3 07/03/2019   ALBUMIN 3.9 07/03/2019   CALCIUM 9.8 07/03/2019   ANIONGAP 14 07/03/2019   No results found for: CHOL No results found for: HDL No results found for: LDLCALC No results found for: TRIG No results found  for: CHOLHDL No results found for: YPPJ0D    Assessment & Plan:   Essential hypertension, benign Well controlled.  No changes to medicines.  Continue to work on eating a healthy diet and exercise.  Labs drawn today.   Gastroesophageal reflux disease without esophagitis Well-controlled on diet.  Mixed hyperlipidemia Check labs today.  Recommend low-fat diet and exercise.  Consider treatment with statin medication if LDL is still elevated.  Morbid obesity with BMI of 40.0-44.9, adult (HCC) Strongly recommend low-fat, low sugar, low salt diet.  Recommend exercise.  Recommend weight loss of 30 pounds by next 68-month visit.  Bipolar 1 disorder, depressed, moderate (HCC) Management per psychiatry.  Pedal edema Continue current medications.  Improved.   Orders Placed This Encounter  Procedures  . CBC with Differential/Platelet  . Comprehensive metabolic panel  . Lipid panel  . TSH    Follow-up: Return in about 6 months (around 08/22/2020).    Blane Ohara, MD

## 2020-02-20 ENCOUNTER — Encounter: Payer: Self-pay | Admitting: Family Medicine

## 2020-02-20 ENCOUNTER — Other Ambulatory Visit: Payer: Self-pay

## 2020-02-20 ENCOUNTER — Ambulatory Visit (INDEPENDENT_AMBULATORY_CARE_PROVIDER_SITE_OTHER): Payer: Medicare HMO | Admitting: Family Medicine

## 2020-02-20 VITALS — BP 124/72 | HR 100 | Temp 96.2°F | Resp 18 | Ht 63.0 in | Wt 243.0 lb

## 2020-02-20 DIAGNOSIS — E782 Mixed hyperlipidemia: Secondary | ICD-10-CM | POA: Diagnosis not present

## 2020-02-20 DIAGNOSIS — R600 Localized edema: Secondary | ICD-10-CM | POA: Insufficient documentation

## 2020-02-20 DIAGNOSIS — K219 Gastro-esophageal reflux disease without esophagitis: Secondary | ICD-10-CM | POA: Insufficient documentation

## 2020-02-20 DIAGNOSIS — Z6841 Body Mass Index (BMI) 40.0 and over, adult: Secondary | ICD-10-CM | POA: Diagnosis not present

## 2020-02-20 DIAGNOSIS — I10 Essential (primary) hypertension: Secondary | ICD-10-CM

## 2020-02-20 DIAGNOSIS — F3132 Bipolar disorder, current episode depressed, moderate: Secondary | ICD-10-CM

## 2020-02-20 DIAGNOSIS — R69 Illness, unspecified: Secondary | ICD-10-CM | POA: Diagnosis not present

## 2020-02-20 NOTE — Assessment & Plan Note (Signed)
Well controlled.  ?No changes to medicines.  ?Continue to work on eating a healthy diet and exercise.  ?Labs drawn today.  ?

## 2020-02-20 NOTE — Assessment & Plan Note (Signed)
Management per psychiatry 

## 2020-02-20 NOTE — Patient Instructions (Signed)
Essential hypertension, benign Well controlled.  No changes to medicines.  Continue to work on eating a healthy diet and exercise.  Labs drawn today.   Gastroesophageal reflux disease without esophagitis Well-controlled on diet.  Mixed hyperlipidemia Check labs today.  Recommend low-fat diet and exercise.  Consider treatment with statin medication if LDL is still elevated.  Morbid obesity with BMI of 40.0-44.9, adult ([place]) Strongly recommend low-fat, low sugar, low salt diet.  Recommend exercise.  Recommend weight loss of 30 pounds by next 84-month visit.  Bipolar 1 disorder, depressed, moderate ([place]) Management per psychiatry.  Pedal edema Continue current medications.  Improved.   DASH Eating Plan DASH stands for "Dietary Approaches to Stop Hypertension." The DASH eating plan is a healthy eating plan that has been shown to reduce high blood pressure (hypertension). It may also reduce your risk for type 2 diabetes, heart disease, and stroke. The DASH eating plan may also help with weight loss. What are tips for following this plan?  General guidelines  Avoid eating more than 2,300 mg (milligrams) of salt (sodium) a day. If you have hypertension, you may need to reduce your sodium intake to 1,500 mg a day.  Limit alcohol intake to no more than 1 drink a day for nonpregnant women and 2 drinks a day for men. One drink equals 12 oz of beer, 5 oz of wine, or 1 oz of hard liquor.  Work with your health care provider to maintain a healthy body weight or to lose weight. Ask what an ideal weight is for you.  Get at least 30 minutes of exercise that causes your heart to beat faster (aerobic exercise) most days of the week. Activities may include walking, swimming, or biking.  Work with your health care provider or diet and nutrition specialist (dietitian) to adjust your eating plan to your individual calorie needs. Reading food labels   Check food labels for the amount of sodium per  serving. Choose foods with less than 5 percent of the Daily Value of sodium. Generally, foods with less than 300 mg of sodium per serving fit into this eating plan.  To find whole grains, look for the word "whole" as the first word in the ingredient list. Shopping  Buy products labeled as "low-sodium" or "no salt added."  Buy fresh foods. Avoid canned foods and premade or frozen meals. Cooking  Avoid adding salt when cooking. Use salt-free seasonings or herbs instead of table salt or sea salt. Check with your health care provider or pharmacist before using salt substitutes.  Do not fry foods. Cook foods using healthy methods such as baking, boiling, grilling, and broiling instead.  Cook with heart-healthy oils, such as olive, canola, soybean, or sunflower oil. Meal planning  Eat a balanced diet that includes: ? 5 or more servings of fruits and vegetables each day. At each meal, try to fill half of your plate with fruits and vegetables. ? Up to 6-8 servings of whole grains each day. ? Less than 6 oz of lean meat, poultry, or fish each day. A 3-oz serving of meat is about the same size as a deck of cards. One egg equals 1 oz. ? 2 servings of low-fat dairy each day. ? A serving of nuts, seeds, or beans 5 times each week. ? Heart-healthy fats. Healthy fats called Omega-3 fatty acids are found in foods such as flaxseeds and coldwater fish, like sardines, salmon, and mackerel.  Limit how much you eat of the following: ? Canned or  prepackaged foods. ? Food that is high in trans fat, such as fried foods. ? Food that is high in saturated fat, such as fatty meat. ? Sweets, desserts, sugary drinks, and other foods with added sugar. ? Full-fat dairy products.  Do not salt foods before eating.  Try to eat at least 2 vegetarian meals each week.  Eat more home-cooked food and less restaurant, buffet, and fast food.  When eating at a restaurant, ask that your food be prepared with less salt or  no salt, if possible. What foods are recommended? The items listed may not be a complete list. Talk with your dietitian about what dietary choices are best for you. Grains Whole-grain or whole-wheat bread. Whole-grain or whole-wheat pasta. Brown rice. Orpah Cobb. Bulgur. Whole-grain and low-sodium cereals. Pita bread. Low-fat, low-sodium crackers. Whole-wheat flour tortillas. Vegetables Fresh or frozen vegetables (raw, steamed, roasted, or grilled). Low-sodium or reduced-sodium tomato and vegetable juice. Low-sodium or reduced-sodium tomato sauce and tomato paste. Low-sodium or reduced-sodium canned vegetables. Fruits All fresh, dried, or frozen fruit. Canned fruit in natural juice (without added sugar). Meat and other protein foods Skinless chicken or [place]. Ground chicken or [place]. Pork with fat trimmed off. Fish and seafood. Egg whites. Dried beans, peas, or lentils. Unsalted nuts, nut butters, and seeds. Unsalted canned beans. Lean cuts of beef with fat trimmed off. Low-sodium, lean deli meat. Dairy Low-fat (1%) or fat-free (skim) milk. Fat-free, low-fat, or reduced-fat cheeses. Nonfat, low-sodium ricotta or cottage cheese. Low-fat or nonfat yogurt. Low-fat, low-sodium cheese. Fats and oils Soft margarine without trans fats. Vegetable oil. Low-fat, reduced-fat, or light mayonnaise and salad dressings (reduced-sodium). Canola, safflower, olive, soybean, and sunflower oils. Avocado. Seasoning and other foods Herbs. Spices. Seasoning mixes without salt. Unsalted popcorn and pretzels. Fat-free sweets. What foods are not recommended? The items listed may not be a complete list. Talk with your dietitian about what dietary choices are best for you. Grains Baked goods made with fat, such as croissants, muffins, or some breads. Dry pasta or rice meal packs. Vegetables Creamed or fried vegetables. Vegetables in a cheese sauce. Regular canned vegetables (not low-sodium or reduced-sodium).  Regular canned tomato sauce and paste (not low-sodium or reduced-sodium). Regular tomato and vegetable juice (not low-sodium or reduced-sodium). Rosita Fire. Olives. Fruits Canned fruit in a light or heavy syrup. Fried fruit. Fruit in cream or butter sauce. Meat and other protein foods Fatty cuts of meat. Ribs. Fried meat. Tomasa Blase. Sausage. Bologna and other processed lunch meats. Salami. Fatback. Hotdogs. Bratwurst. Salted nuts and seeds. Canned beans with added salt. Canned or smoked fish. Whole eggs or egg yolks. Chicken or [place] with skin. Dairy Whole or 2% milk, cream, and half-and-half. Whole or full-fat cream cheese. Whole-fat or sweetened yogurt. Full-fat cheese. Nondairy creamers. Whipped toppings. Processed cheese and cheese spreads. Fats and oils Butter. Stick margarine. Lard. Shortening. Ghee. Bacon fat. Tropical oils, such as coconut, palm kernel, or palm oil. Seasoning and other foods Salted popcorn and pretzels. Onion salt, garlic salt, seasoned salt, table salt, and sea salt. Worcestershire sauce. Tartar sauce. Barbecue sauce. Teriyaki sauce. Soy sauce, including reduced-sodium. Steak sauce. Canned and packaged gravies. Fish sauce. Oyster sauce. Cocktail sauce. Horseradish that you find on the shelf. Ketchup. Mustard. Meat flavorings and tenderizers. Bouillon cubes. Hot sauce and Tabasco sauce. Premade or packaged marinades. Premade or packaged taco seasonings. Relishes. Regular salad dressings. Where to find more information:  National Heart, Lung, and Blood Institute: PopSteam.is  American Heart Association: www.heart.org Summary  The DASH  eating plan is a healthy eating plan that has been shown to reduce high blood pressure (hypertension). It may also reduce your risk for type 2 diabetes, heart disease, and stroke.  With the DASH eating plan, you should limit salt (sodium) intake to 2,300 mg a day. If you have hypertension, you may need to reduce your sodium intake to 1,500 mg  a day.  When on the DASH eating plan, aim to eat more fresh fruits and vegetables, whole grains, lean proteins, low-fat dairy, and heart-healthy fats.  Work with your health care provider or diet and nutrition specialist (dietitian) to adjust your eating plan to your individual calorie needs. This information is not intended to replace advice given to you by your health care provider. Make sure you discuss any questions you have with your health care provider. Document Revised: 11/06/2017 Document Reviewed: 11/17/2016 Elsevier Patient Education  2020 [employer].

## 2020-02-20 NOTE — Assessment & Plan Note (Signed)
Continue current medications.  Improved.

## 2020-02-20 NOTE — Assessment & Plan Note (Signed)
Well controlled on diet 

## 2020-02-20 NOTE — Assessment & Plan Note (Signed)
Check labs today.  Recommend low-fat diet and exercise.  Consider treatment with statin medication if LDL is still elevated.

## 2020-02-20 NOTE — Assessment & Plan Note (Signed)
Strongly recommend low-fat, low sugar, low salt diet.  Recommend exercise.  Recommend weight loss of 30 pounds by next 53-month visit.

## 2020-02-21 LAB — COMPREHENSIVE METABOLIC PANEL
ALT: 8 IU/L (ref 0–32)
AST: 12 IU/L (ref 0–40)
Albumin/Globulin Ratio: 1.4 (ref 1.2–2.2)
Albumin: 4.3 g/dL (ref 3.8–4.9)
Alkaline Phosphatase: 73 IU/L (ref 39–117)
BUN/Creatinine Ratio: 16 (ref 9–23)
BUN: 15 mg/dL (ref 6–24)
Bilirubin Total: 0.4 mg/dL (ref 0.0–1.2)
CO2: 28 mmol/L (ref 20–29)
Calcium: 10 mg/dL (ref 8.7–10.2)
Chloride: 93 mmol/L — ABNORMAL LOW (ref 96–106)
Creatinine, Ser: 0.96 mg/dL (ref 0.57–1.00)
GFR calc Af Amer: 76 mL/min/{1.73_m2} (ref >59)
GFR calc non Af Amer: 66 mL/min/{1.73_m2} (ref >59)
Globulin, Total: 3 g/dL (ref 1.5–4.5)
Glucose: 107 mg/dL — ABNORMAL HIGH (ref 65–99)
Potassium: 4.4 mmol/L (ref 3.5–5.2)
Sodium: 143 mmol/L (ref 134–144)
Total Protein: 7.3 g/dL (ref 6.0–8.5)

## 2020-02-21 LAB — CBC WITH DIFFERENTIAL/PLATELET
Basophils Absolute: 0.1 10*3/uL (ref 0.0–0.2)
Basos: 1 %
EOS (ABSOLUTE): 0.1 10*3/uL (ref 0.0–0.4)
Eos: 1 %
Hematocrit: 38.7 % (ref 34.0–46.6)
Hemoglobin: 12.5 g/dL (ref 11.1–15.9)
Immature Grans (Abs): 0 10*3/uL (ref 0.0–0.1)
Immature Granulocytes: 1 %
Lymphocytes Absolute: 1.7 10*3/uL (ref 0.7–3.1)
Lymphs: 25 %
MCH: 28 pg (ref 26.6–33.0)
MCHC: 32.3 g/dL (ref 31.5–35.7)
MCV: 87 fL (ref 79–97)
Monocytes Absolute: 0.5 10*3/uL (ref 0.1–0.9)
Monocytes: 8 %
Neutrophils Absolute: 4.3 10*3/uL (ref 1.4–7.0)
Neutrophils: 64 %
Platelets: 373 10*3/uL (ref 150–450)
RBC: 4.47 x10E6/uL (ref 3.77–5.28)
RDW: 13.2 % (ref 11.7–15.4)
WBC: 6.6 10*3/uL (ref 3.4–10.8)

## 2020-02-21 LAB — LIPID PANEL
Chol/HDL Ratio: 4.4 ratio (ref 0.0–4.4)
Cholesterol, Total: 260 mg/dL — ABNORMAL HIGH (ref 100–199)
HDL: 59 mg/dL (ref >39)
LDL Chol Calc (NIH): 158 mg/dL — ABNORMAL HIGH (ref 0–99)
Triglycerides: 236 mg/dL — ABNORMAL HIGH (ref 0–149)
VLDL Cholesterol Cal: 43 mg/dL — ABNORMAL HIGH (ref 5–40)

## 2020-02-21 LAB — CARDIOVASCULAR RISK ASSESSMENT

## 2020-02-21 LAB — TSH: TSH: 1.1 u[IU]/mL (ref 0.450–4.500)

## 2020-03-21 ENCOUNTER — Other Ambulatory Visit: Payer: Self-pay | Admitting: Family Medicine

## 2020-04-08 ENCOUNTER — Other Ambulatory Visit: Payer: Self-pay | Admitting: Family Medicine

## 2020-05-09 DIAGNOSIS — R69 Illness, unspecified: Secondary | ICD-10-CM | POA: Diagnosis not present

## 2020-05-10 DIAGNOSIS — R69 Illness, unspecified: Secondary | ICD-10-CM | POA: Diagnosis not present

## 2020-05-10 DIAGNOSIS — F419 Anxiety disorder, unspecified: Secondary | ICD-10-CM | POA: Diagnosis not present

## 2020-05-10 DIAGNOSIS — E876 Hypokalemia: Secondary | ICD-10-CM | POA: Diagnosis not present

## 2020-05-10 DIAGNOSIS — I10 Essential (primary) hypertension: Secondary | ICD-10-CM | POA: Diagnosis not present

## 2020-05-10 DIAGNOSIS — R609 Edema, unspecified: Secondary | ICD-10-CM | POA: Diagnosis not present

## 2020-05-10 DIAGNOSIS — G4700 Insomnia, unspecified: Secondary | ICD-10-CM | POA: Diagnosis not present

## 2020-05-17 DIAGNOSIS — R69 Illness, unspecified: Secondary | ICD-10-CM | POA: Diagnosis not present

## 2020-05-22 DIAGNOSIS — R69 Illness, unspecified: Secondary | ICD-10-CM | POA: Diagnosis not present

## 2020-05-31 DIAGNOSIS — R69 Illness, unspecified: Secondary | ICD-10-CM | POA: Diagnosis not present

## 2020-07-03 DIAGNOSIS — R69 Illness, unspecified: Secondary | ICD-10-CM | POA: Diagnosis not present

## 2020-07-24 DIAGNOSIS — R69 Illness, unspecified: Secondary | ICD-10-CM | POA: Diagnosis not present

## 2020-08-02 DIAGNOSIS — R69 Illness, unspecified: Secondary | ICD-10-CM | POA: Diagnosis not present

## 2020-08-09 DIAGNOSIS — H269 Unspecified cataract: Secondary | ICD-10-CM | POA: Diagnosis not present

## 2020-08-09 DIAGNOSIS — R69 Illness, unspecified: Secondary | ICD-10-CM | POA: Diagnosis not present

## 2020-08-09 DIAGNOSIS — F419 Anxiety disorder, unspecified: Secondary | ICD-10-CM | POA: Diagnosis not present

## 2020-08-09 DIAGNOSIS — R32 Unspecified urinary incontinence: Secondary | ICD-10-CM | POA: Diagnosis not present

## 2020-08-09 DIAGNOSIS — I951 Orthostatic hypotension: Secondary | ICD-10-CM | POA: Diagnosis not present

## 2020-08-09 DIAGNOSIS — Z008 Encounter for other general examination: Secondary | ICD-10-CM | POA: Diagnosis not present

## 2020-08-09 DIAGNOSIS — Z604 Social exclusion and rejection: Secondary | ICD-10-CM | POA: Diagnosis not present

## 2020-08-09 DIAGNOSIS — R609 Edema, unspecified: Secondary | ICD-10-CM | POA: Diagnosis not present

## 2020-08-09 DIAGNOSIS — Z6839 Body mass index (BMI) 39.0-39.9, adult: Secondary | ICD-10-CM | POA: Diagnosis not present

## 2020-08-09 DIAGNOSIS — I10 Essential (primary) hypertension: Secondary | ICD-10-CM | POA: Diagnosis not present

## 2020-08-16 DIAGNOSIS — R69 Illness, unspecified: Secondary | ICD-10-CM | POA: Diagnosis not present

## 2020-08-22 ENCOUNTER — Encounter: Payer: Self-pay | Admitting: Family Medicine

## 2020-08-22 ENCOUNTER — Ambulatory Visit (INDEPENDENT_AMBULATORY_CARE_PROVIDER_SITE_OTHER): Payer: Medicare HMO | Admitting: Family Medicine

## 2020-08-22 ENCOUNTER — Other Ambulatory Visit: Payer: Self-pay

## 2020-08-22 VITALS — BP 114/78 | HR 100 | Temp 95.8°F | Resp 18 | Ht 63.0 in | Wt 209.0 lb

## 2020-08-22 DIAGNOSIS — I10 Essential (primary) hypertension: Secondary | ICD-10-CM

## 2020-08-22 DIAGNOSIS — E782 Mixed hyperlipidemia: Secondary | ICD-10-CM | POA: Diagnosis not present

## 2020-08-22 DIAGNOSIS — Z23 Encounter for immunization: Secondary | ICD-10-CM | POA: Diagnosis not present

## 2020-08-22 DIAGNOSIS — F314 Bipolar disorder, current episode depressed, severe, without psychotic features: Secondary | ICD-10-CM

## 2020-08-22 DIAGNOSIS — R69 Illness, unspecified: Secondary | ICD-10-CM | POA: Diagnosis not present

## 2020-08-22 DIAGNOSIS — R7301 Impaired fasting glucose: Secondary | ICD-10-CM

## 2020-08-22 DIAGNOSIS — Z6841 Body Mass Index (BMI) 40.0 and over, adult: Secondary | ICD-10-CM | POA: Diagnosis not present

## 2020-08-22 NOTE — Progress Notes (Signed)
Established Patient Office Visit  Subjective:  Patient ID: Cynthia [name], female    DOB: [birth year]  Age: 59 y.o. MRN: [id number]  CC:  Chief Complaint  Patient presents with  . Hypertension  . Hyperlipidemia  . Gastroesophageal Reflux    HPI [name] presents for follow up hypertension prediabetes and hyperlipidemia.  In March/2021 her LDL was 151, triglycerides 245, A1c was not done at that time previously in the fall 2020 it was 5.6.  She has had swelling in her legs intermittently which has responded well to diuretics.  She does not eat healthy nor does she exercise.  She suffers from morbid (severe) obesity due to excess calories.  Again she eats a poor diet and does not exercise.  With regard to the bipolar disorder, current episode depressed, severe, this is follow-up of bipolar disorder.  The diagnosis of bipolar disorder was made 10 plus years ago.  Presently, she feels a severe degree of depression.  She receives her care from [facility].  Current medications include Trileptal 300 mg twice a day which was started 1 week ago.  She is failed on depakote, abilify, and risperdone. Psychiatric history is significant for prior depressive episodes (multiple times previously), prior suicide attempt, prior psychiatric hospitalizations (at least 3 times), bipolar disorder, and manic attacks.    Past Medical History:  Diagnosis Date  . Bipolar 1 disorder (HCC)   . Essential hypertension   . GERD (gastroesophageal reflux disease)     Family History  Problem Relation Age of Onset  . Cancer Mother        lung  . Cancer Father        prostate  . Diabetes Paternal Grandfather     Social History   Socioeconomic History  . Marital status: Divorced    Spouse name: Not on file  . Number of children: 3  . Years of education: Not on file  . Highest education level: Not on file  Occupational History  . Not on file  Tobacco Use  . Smoking status: Never Smoker    . Smokeless tobacco: Never Used  Substance and Sexual Activity  . Alcohol use: Not Currently    Comment: rarely  . Drug use: Never  . Sexual activity: Not on file  Other Topics Concern  . Not on file  Social History Narrative  . Not on file   Social Determinants of Health   Financial Resource Strain:   . Difficulty of Paying Living Expenses: Not on file  Food Insecurity:   . Worried About Programme researcher, broadcasting/film/video in the Last Year: Not on file  . Ran Out of Food in the Last Year: Not on file  Transportation Needs:   . Lack of Transportation (Medical): Not on file  . Lack of Transportation (Non-Medical): Not on file  Physical Activity:   . Days of Exercise per Week: Not on file  . Minutes of Exercise per Session: Not on file  Stress:   . Feeling of Stress : Not on file  Social Connections:   . Frequency of Communication with Friends and Family: Not on file  . Frequency of Social Gatherings with Friends and Family: Not on file  . Attends Religious Services: Not on file  . Active Member of Clubs or Organizations: Not on file  . Attends Banker Meetings: Not on file  . Marital Status: Not on file  Intimate Partner Violence:   . Fear of Current or Ex-Partner:  Not on file  . Emotionally Abused: Not on file  . Physically Abused: Not on file  . Sexually Abused: Not on file    Outpatient Medications Prior to Visit  Medication Sig Dispense Refill  . albuterol (VENTOLIN HFA) 108 (90 Base) MCG/ACT inhaler Inhale 1-2 puffs into the lungs every 4 (four) hours as needed for wheezing or shortness of breath.    . furosemide (LASIX) 20 MG tablet Take 1 tablet by mouth once daily 90 tablet 0  . hydrochlorothiazide (HYDRODIURIL) 25 MG tablet Take 1 tablet by mouth once daily 90 tablet 0  . Oxcarbazepine (TRILEPTAL) 300 MG tablet Take 300 mg by mouth 2 (two) times daily.    . ARIPiprazole (ABILIFY) 5 MG tablet Take 5 mg by mouth every morning.    . divalproex (DEPAKOTE) 500 MG DR  tablet Take 500 mg by mouth 2 (two) times daily.    Marland Kitchen imipramine (TOFRANIL) 50 MG tablet Take 150 mg by mouth at bedtime.    . meclizine (ANTIVERT) 25 MG tablet Take 25 mg by mouth.    . risperiDONE (RISPERDAL) 3 MG tablet Take 6 mg by mouth at bedtime.     No facility-administered medications prior to visit.    No Known Allergies  ROS Review of Systems  Constitutional: Negative for chills, fatigue and fever.  HENT: Negative for congestion, ear pain, rhinorrhea and sore throat.   Respiratory: Negative for cough and shortness of breath.   Cardiovascular: Negative for chest pain.  Gastrointestinal: Negative for abdominal pain, constipation, diarrhea, nausea and vomiting.  Endocrine: Positive for polydipsia. Negative for polyphagia.  Genitourinary: Negative for dysuria and urgency.  Musculoskeletal: Negative for back pain and myalgias.  Neurological: Negative for dizziness, weakness, light-headedness and headaches.  Psychiatric/Behavioral: Positive for agitation, decreased concentration, dysphoric mood and sleep disturbance. Negative for suicidal ideas. The patient is nervous/anxious and is hyperactive.        Denies hallucinations.  Denies paranoia.      Objective:    Physical Exam Constitutional:      General: She is in acute distress.     Appearance: She is well-developed. She is obese.     Comments: Obese.   Cardiovascular:     Rate and Rhythm: Normal rate and regular rhythm.     Heart sounds: Normal heart sounds.  Pulmonary:     Effort: Pulmonary effort is normal.     Breath sounds: Normal breath sounds.  Abdominal:     General: Bowel sounds are normal.     Tenderness: There is no abdominal tenderness.  Neurological:     Mental Status: She is alert.  Psychiatric:        Attention and Perception: She is inattentive.        Mood and Affect: Mood is anxious. Affect is labile and tearful.        Speech: Speech is rapid and pressured.        Behavior: Behavior is agitated  and hyperactive.        Thought Content: Thought content is not paranoid or delusional. Thought content does not include suicidal ideation.     Comments: Patient has been constantly moving throughout her visit including in the lobby. She is either "pacing" although not in a straight line. Just constantly moving around. Hands shaky. Tearing throughout the visit.      BP 114/78   Pulse 100   Temp (!) 95.8 F (35.4 C)   Resp 18   Ht 5\' 3"  (1.6 m)  Wt 209 lb (94.8 kg)   BMI 37.02 kg/m  Wt Readings from Last 3 Encounters:  08/22/20 209 lb (94.8 kg)  02/20/20 243 lb (110.2 kg)  07/03/19 237 lb 7 oz (107.7 kg)     Health Maintenance Due  Topic Date Due  . Hepatitis C Screening  Never done  . COVID-19 Vaccine (1) Never done  . HIV Screening  Never done  . TETANUS/TDAP  Never done  . PAP SMEAR-Modifier  Never done    There are no preventive care reminders to display for this patient.  Lab Results  Component Value Date   TSH 0.565 08/22/2020   Lab Results  Component Value Date   WBC 11.9 (H) 08/22/2020   HGB 15.4 08/22/2020   HCT 46.0 08/22/2020   MCV 88 08/22/2020   PLT 523 (H) 08/22/2020   Lab Results  Component Value Date   NA 136 08/22/2020   K 3.1 (L) 08/22/2020   CO2 28 08/22/2020   GLUCOSE 138 (H) 08/22/2020   BUN 12 08/22/2020   CREATININE 0.98 08/22/2020   BILITOT 0.9 08/22/2020   ALKPHOS 105 08/22/2020   AST 18 08/22/2020   ALT 14 08/22/2020   PROT 7.9 08/22/2020   ALBUMIN 4.5 08/22/2020   CALCIUM 10.7 (H) 08/22/2020   ANIONGAP 14 07/03/2019   Lab Results  Component Value Date   CHOL 258 (H) 08/22/2020   Lab Results  Component Value Date   HDL 63 08/22/2020   Lab Results  Component Value Date   LDLCALC 151 (H) 08/22/2020   Lab Results  Component Value Date   TRIG 245 (H) 08/22/2020   Lab Results  Component Value Date   CHOLHDL 4.1 08/22/2020   No results found for: HGBA1C    Assessment & Plan:  1. Essential hypertension,  benign Well controlled.  No changes to medicines.  Continue to work on eating a healthy diet and exercise.  Labs drawn today.  - CBC with Differential/Platelet - Comprehensive metabolic panel - TSH  2. Hyperlipidemia Assess after labs come back. Continue to work on eating a healthy diet and exercise.  Labs drawn today.  - Lipid panel  3. Morbid obesity with BMI of 40.0-44.9, adult (HCC) Recommend continue to work on eating healthy diet and exercise.  4. Bipolar 1 disorder, depression, severe (HCC) I have put a call into psychiatry. Continue current medications. Contracted for safety. Spoke with her daughter, Suzette Battiest, who says she has been very sick the last 6 months with cycling. The agitation she is having in the office per her daughter seems to be more than usual.  5. Encounter for immunization - Flu Vaccine MDCK QUAD PF  7. Impaired fasting glucose Add on a1c.  Orders Placed This Encounter  Procedures  . Flu Vaccine MDCK QUAD PF  . CBC with Differential/Platelet  . Comprehensive metabolic panel  . Lipid panel  . TSH  . Cardiovascular Risk Assessment    Follow-up: No follow-ups on file.    Blane Ohara, MD   Addendum: Blood count is elevated 11.9.  Please asked the patient if she is feeling sick. Platelets are also elevated.  I would recommend a repeat CBC in 1 month at follow up.. Sugar is elevated at 138.  Please add on an A1c. Potassium is low at 3.1.  Add on potassium chloride 20 mEq daily. Cholesterol is very high.  LDL 131, triglycerides 245.  I would recommend Crestor 20 mg once daily at night. Thyroid normal.

## 2020-08-23 LAB — LIPID PANEL
Chol/HDL Ratio: 4.1 ratio (ref 0.0–4.4)
Cholesterol, Total: 258 mg/dL — ABNORMAL HIGH (ref 100–199)
HDL: 63 mg/dL (ref >39)
LDL Chol Calc (NIH): 151 mg/dL — ABNORMAL HIGH (ref 0–99)
Triglycerides: 245 mg/dL — ABNORMAL HIGH (ref 0–149)
VLDL Cholesterol Cal: 44 mg/dL — ABNORMAL HIGH (ref 5–40)

## 2020-08-23 LAB — COMPREHENSIVE METABOLIC PANEL
ALT: 14 IU/L (ref 0–32)
AST: 18 IU/L (ref 0–40)
Albumin/Globulin Ratio: 1.3 (ref 1.2–2.2)
Albumin: 4.5 g/dL (ref 3.8–4.9)
Alkaline Phosphatase: 105 IU/L (ref 44–121)
BUN/Creatinine Ratio: 12 (ref 9–23)
BUN: 12 mg/dL (ref 6–24)
Bilirubin Total: 0.9 mg/dL (ref 0.0–1.2)
CO2: 28 mmol/L (ref 20–29)
Calcium: 10.7 mg/dL — ABNORMAL HIGH (ref 8.7–10.2)
Chloride: 90 mmol/L — ABNORMAL LOW (ref 96–106)
Creatinine, Ser: 0.98 mg/dL (ref 0.57–1.00)
GFR calc Af Amer: 75 mL/min/{1.73_m2} (ref >59)
GFR calc non Af Amer: 65 mL/min/{1.73_m2} (ref >59)
Globulin, Total: 3.4 g/dL (ref 1.5–4.5)
Glucose: 138 mg/dL — ABNORMAL HIGH (ref 65–99)
Potassium: 3.1 mmol/L — ABNORMAL LOW (ref 3.5–5.2)
Sodium: 136 mmol/L (ref 134–144)
Total Protein: 7.9 g/dL (ref 6.0–8.5)

## 2020-08-23 LAB — CBC WITH DIFFERENTIAL/PLATELET
Basophils Absolute: 0.1 10*3/uL (ref 0.0–0.2)
Basos: 1 %
EOS (ABSOLUTE): 0.1 10*3/uL (ref 0.0–0.4)
Eos: 1 %
Hematocrit: 46 % (ref 34.0–46.6)
Hemoglobin: 15.4 g/dL (ref 11.1–15.9)
Immature Grans (Abs): 0 10*3/uL (ref 0.0–0.1)
Immature Granulocytes: 0 %
Lymphocytes Absolute: 2.7 10*3/uL (ref 0.7–3.1)
Lymphs: 22 %
MCH: 29.3 pg (ref 26.6–33.0)
MCHC: 33.5 g/dL (ref 31.5–35.7)
MCV: 88 fL (ref 79–97)
Monocytes Absolute: 0.8 10*3/uL (ref 0.1–0.9)
Monocytes: 7 %
Neutrophils Absolute: 8.3 10*3/uL — ABNORMAL HIGH (ref 1.4–7.0)
Neutrophils: 69 %
Platelets: 523 10*3/uL — ABNORMAL HIGH (ref 150–450)
RBC: 5.26 x10E6/uL (ref 3.77–5.28)
RDW: 14 % (ref 11.7–15.4)
WBC: 11.9 10*3/uL — ABNORMAL HIGH (ref 3.4–10.8)

## 2020-08-23 LAB — TSH: TSH: 0.565 u[IU]/mL (ref 0.450–4.500)

## 2020-08-23 LAB — CARDIOVASCULAR RISK ASSESSMENT

## 2020-08-25 ENCOUNTER — Encounter: Payer: Self-pay | Admitting: Family Medicine

## 2020-08-26 ENCOUNTER — Other Ambulatory Visit: Payer: Self-pay

## 2020-08-26 ENCOUNTER — Ambulatory Visit (INDEPENDENT_AMBULATORY_CARE_PROVIDER_SITE_OTHER)
Admission: EM | Admit: 2020-08-26 | Discharge: 2020-08-26 | Disposition: A | Discharge status: Other Acute Inpatient Hospital | Payer: Medicare HMO | Source: Home / Self Care

## 2020-08-26 ENCOUNTER — Emergency Department (HOSPITAL_COMMUNITY)
Admission: EM | Admit: 2020-08-26 | Discharge: 2020-08-28 | Disposition: A | Discharge status: Other Acute Inpatient Hospital | Payer: Medicare HMO | Attending: Emergency Medicine | Admitting: Emergency Medicine

## 2020-08-26 ENCOUNTER — Encounter (HOSPITAL_COMMUNITY): Payer: Self-pay | Admitting: Emergency Medicine

## 2020-08-26 DIAGNOSIS — I491 Atrial premature depolarization: Secondary | ICD-10-CM | POA: Diagnosis not present

## 2020-08-26 DIAGNOSIS — E876 Hypokalemia: Secondary | ICD-10-CM | POA: Diagnosis not present

## 2020-08-26 DIAGNOSIS — Z20822 Contact with and (suspected) exposure to covid-19: Secondary | ICD-10-CM | POA: Diagnosis not present

## 2020-08-26 DIAGNOSIS — F329 Major depressive disorder, single episode, unspecified: Secondary | ICD-10-CM | POA: Insufficient documentation

## 2020-08-26 DIAGNOSIS — Z801 Family history of malignant neoplasm of trachea, bronchus and lung: Secondary | ICD-10-CM | POA: Diagnosis not present

## 2020-08-26 DIAGNOSIS — K219 Gastro-esophageal reflux disease without esophagitis: Secondary | ICD-10-CM | POA: Diagnosis not present

## 2020-08-26 DIAGNOSIS — F061 Catatonic disorder due to known physiological condition: Secondary | ICD-10-CM

## 2020-08-26 DIAGNOSIS — R531 Weakness: Secondary | ICD-10-CM | POA: Diagnosis not present

## 2020-08-26 DIAGNOSIS — Z915 Personal history of self-harm: Secondary | ICD-10-CM | POA: Insufficient documentation

## 2020-08-26 DIAGNOSIS — Z79899 Other long term (current) drug therapy: Secondary | ICD-10-CM | POA: Insufficient documentation

## 2020-08-26 DIAGNOSIS — F3130 Bipolar disorder, current episode depressed, mild or moderate severity, unspecified: Secondary | ICD-10-CM | POA: Diagnosis present

## 2020-08-26 DIAGNOSIS — Z0279 Encounter for issue of other medical certificate: Secondary | ICD-10-CM | POA: Insufficient documentation

## 2020-08-26 DIAGNOSIS — E039 Hypothyroidism, unspecified: Secondary | ICD-10-CM | POA: Diagnosis not present

## 2020-08-26 DIAGNOSIS — R69 Illness, unspecified: Secondary | ICD-10-CM | POA: Diagnosis not present

## 2020-08-26 DIAGNOSIS — J449 Chronic obstructive pulmonary disease, unspecified: Secondary | ICD-10-CM | POA: Diagnosis not present

## 2020-08-26 DIAGNOSIS — F339 Major depressive disorder, recurrent, unspecified: Secondary | ICD-10-CM

## 2020-08-26 DIAGNOSIS — R0902 Hypoxemia: Secondary | ICD-10-CM | POA: Diagnosis not present

## 2020-08-26 DIAGNOSIS — I10 Essential (primary) hypertension: Secondary | ICD-10-CM | POA: Insufficient documentation

## 2020-08-26 DIAGNOSIS — G4700 Insomnia, unspecified: Secondary | ICD-10-CM | POA: Diagnosis not present

## 2020-08-26 DIAGNOSIS — Z9151 Personal history of suicidal behavior: Secondary | ICD-10-CM | POA: Diagnosis not present

## 2020-08-26 DIAGNOSIS — E785 Hyperlipidemia, unspecified: Secondary | ICD-10-CM | POA: Diagnosis not present

## 2020-08-26 DIAGNOSIS — R000 Tachycardia, unspecified: Secondary | ICD-10-CM | POA: Diagnosis not present

## 2020-08-26 DIAGNOSIS — F32A Depression, unspecified: Secondary | ICD-10-CM

## 2020-08-26 LAB — COMPREHENSIVE METABOLIC PANEL
ALT: 17 U/L (ref 0–44)
AST: 19 U/L (ref 15–41)
Albumin: 4.2 g/dL (ref 3.5–5.0)
Alkaline Phosphatase: 84 U/L (ref 38–126)
Anion gap: 14 (ref 5–15)
BUN: 23 mg/dL — ABNORMAL HIGH (ref 6–20)
CO2: 31 mmol/L (ref 22–32)
Calcium: 9.3 mg/dL (ref 8.9–10.3)
Chloride: 90 mmol/L — ABNORMAL LOW (ref 98–111)
Creatinine, Ser: 1.15 mg/dL — ABNORMAL HIGH (ref 0.44–1.00)
GFR calc Af Amer: >60 mL/min (ref >60)
GFR calc non Af Amer: 53 mL/min — ABNORMAL LOW (ref >60)
Glucose, Bld: 127 mg/dL — ABNORMAL HIGH (ref 70–99)
Potassium: 2.2 mmol/L — CL (ref 3.5–5.1)
Sodium: 135 mmol/L (ref 135–145)
Total Bilirubin: 0.8 mg/dL (ref 0.3–1.2)
Total Protein: 7.8 g/dL (ref 6.5–8.1)

## 2020-08-26 LAB — CBC WITH DIFFERENTIAL/PLATELET
Abs Immature Granulocytes: 0.04 10*3/uL (ref 0.00–0.07)
Basophils Absolute: 0.1 10*3/uL (ref 0.0–0.1)
Basophils Relative: 1 %
Eosinophils Absolute: 0.1 10*3/uL (ref 0.0–0.5)
Eosinophils Relative: 1 %
HCT: 42 % (ref 36.0–46.0)
Hemoglobin: 14.5 g/dL (ref 12.0–15.0)
Immature Granulocytes: 0 %
Lymphocytes Relative: 31 %
Lymphs Abs: 3 10*3/uL (ref 0.7–4.0)
MCH: 29.9 pg (ref 26.0–34.0)
MCHC: 34.5 g/dL (ref 30.0–36.0)
MCV: 86.6 fL (ref 80.0–100.0)
Monocytes Absolute: 0.7 10*3/uL (ref 0.1–1.0)
Monocytes Relative: 7 %
Neutro Abs: 6 10*3/uL (ref 1.7–7.7)
Neutrophils Relative %: 60 %
Platelets: 436 10*3/uL — ABNORMAL HIGH (ref 150–400)
RBC: 4.85 MIL/uL (ref 3.87–5.11)
RDW: 12.8 % (ref 11.5–15.5)
WBC: 9.8 10*3/uL (ref 4.0–10.5)
nRBC: 0 % (ref 0.0–0.2)

## 2020-08-26 LAB — ACETAMINOPHEN LEVEL: Acetaminophen (Tylenol), Serum: <10 ug/mL — ABNORMAL LOW (ref 10–30)

## 2020-08-26 LAB — SALICYLATE LEVEL: Salicylate Lvl: <7 mg/dL — ABNORMAL LOW (ref 7.0–30.0)

## 2020-08-26 LAB — SARS CORONAVIRUS 2 BY RT PCR (HOSPITAL ORDER, PERFORMED IN ~~LOC~~ HOSPITAL LAB): SARS Coronavirus 2: NEGATIVE

## 2020-08-26 LAB — ETHANOL: Alcohol, Ethyl (B): <10 mg/dL (ref <10)

## 2020-08-26 MED ORDER — POTASSIUM CHLORIDE CRYS ER 20 MEQ PO TBCR
20.0000 meq | EXTENDED_RELEASE_TABLET | Freq: Once | ORAL | Status: AC
  Administered 2020-08-26: 20 meq via ORAL
  Filled 2020-08-26: qty 1

## 2020-08-26 MED ORDER — POTASSIUM CHLORIDE CRYS ER 20 MEQ PO TBCR
40.0000 meq | EXTENDED_RELEASE_TABLET | Freq: Once | ORAL | Status: AC
  Administered 2020-08-26: 40 meq via ORAL
  Filled 2020-08-26: qty 2

## 2020-08-26 MED ORDER — POTASSIUM CHLORIDE 10 MEQ/100ML IV SOLN
10.0000 meq | INTRAVENOUS | Status: AC
  Administered 2020-08-26 – 2020-08-27 (×4): 10 meq via INTRAVENOUS
  Filled 2020-08-26 (×4): qty 100

## 2020-08-26 NOTE — ED Notes (Signed)
Belongings in locker 28 

## 2020-08-26 NOTE — BH Assessment (Signed)
Comprehensive Clinical Assessment (CCA) Note  08/26/2020 Cynthia [name] [id number]   Pt is a 59 y.o. female presenting to [facility] accompanied by her daughter for an assessment. Pt was oriented x3 and provided minimal information during assessment. Pt stated that today was Friday 16th, and she does not know the current president. Pt had a difficult time sitting down, but upon standing pt was unsteady on her feet. Pt breathing is irregular, and her eye contact is avoidant. Pt reports reason for today's visit is "all I want to do is sleep". Pt reports a history of bipolar disorder and being on disability for mental illness. Pt is followed by Venia Minks, NP and her PCP is Dr. Sedalia Muta with [facility]. Pt could not answer many of clinician questions but denied having SI/HI/AVH and SIB. Pt provided consent to obtain collateral information from her daughter Suzette Battiest.  Suzette Battiest reports pt has a history of bipolar and having to be hospitalized three months ago for similar presentations as today. Suzette Battiest states that pt lives alone and is not attending to her ADL's. Suzette Battiest reports having to assist pt today with bathing and changing her clothes before coming for assessment today. Suzette Battiest states that pt is not sleeping well and has been having mixed depressive/manic like symptoms for the past six months. Suzette Battiest states "she is not able to function; she is not washing, eating and not taking care of herself". Suzette Battiest reports that pt goes to her son house and presents with severe anxiety and asking for help. Suzette Battiest reports that pt provider has been notified of pt current issues. Suzette Battiest states that pt has been taken Depakote for a long time but was taken off it about a month ago and was being considered for injectable medications due to a history of medical non-compliance. Suzette Battiest states that she feels pt is unsafe and has a history of suicidal attempts. Suzette Battiest  reports history of suicidal attempts of pt taking OD of medications and was [place] railroad. Suzette Battiest is available for collateral information [phone]).   Disposition: Per Hillery Jacks, NP, patient appears to have an emergency medical condition provider recommends patient be transferred to the emergency department for further evaluation.    Visit Diagnosis:      ICD-10-CM   1. MDD (major depressive disorder), recurrent, with catatonic features (HCC)  F33.9    F06.1       CCA Screening, Triage and Referral (STR)  Patient Reported Information How did you hear about [place]? Family/Friend  Referral name: Morene Crocker  Referral phone number: No data recorded  Whom do you see for routine medical problems? Primary Care  Practice/Facility Name: [facility]  Practice/Facility Phone Number: No data recorded Name of Contact: No data recorded Contact Number: [phone]  Contact Fax Number: Unknown  Prescriber Name: Dr. Sedalia Muta  Prescriber Address (if known): Unknown   What Is the Reason for Your Visit/Call Today? Mental Health Treatment  How Long Has This Been Causing You Problems? > than 6 months  What Do You Feel Would Help You the Most Today? Assessment Only   Have You Recently Been in Any Inpatient Treatment (Hospital/Detox/Crisis Center/28-Day Program)? Yes  Name/Location of Program/Hospital:[facility]  How Long Were You There? 4 days  When Were You Discharged? No data recorded  Have You Ever Received Services From [facility] Before? Yes  Who Do You See at [facility]? Dr. Sedalia Muta   Have You Recently Had Any Thoughts About Hurting Yourself? No  Are You Planning to Commit Suicide/Harm Yourself At This time? No   Have you Recently Had Thoughts About Hurting Someone Karolee Ohs? No  Explanation: No data recorded  Have You Used Any Alcohol or Drugs in the Past 24 Hours? No  How Long Ago Did You Use Drugs or Alcohol? No data recorded What Did You Use  and How Much? No data recorded  Do You Currently Have a Therapist/Psychiatrist? Yes  Name of Therapist/Psychiatrist: Scarlette Shorts, NP   Have You Been Recently Discharged From Any Office Practice or Programs? No  Explanation of Discharge From Practice/Program: No data recorded    CCA Screening Triage Referral Assessment Type of Contact: Face-to-Face  Is this Initial or Reassessment? No data recorded Date Telepsych consult ordered in [facility]:  No data recorded Time Telepsych consult ordered in [facility]:  No data recorded  Patient Reported Information Reviewed? Yes  Patient Left Without Being Seen? No data recorded Reason for Not Completing Assessment: No data recorded  Collateral Involvement: Morene Crocker (daughter)   Does Patient Have a Automotive engineer Guardian? No data recorded Name and Contact of Legal Guardian: No data recorded If Minor and Not Living with Parent(s), Who has Custody? No data recorded Is CPS involved or ever been involved? No data recorded Is APS involved or ever been involved? No data recorded  Patient Determined To Be At Risk for Harm To Self or Others Based on Review of Patient Reported Information or Presenting Complaint? Yes, for Self-Harm  Method: No data recorded Availability of Means: No data recorded Intent: No data recorded Notification Required: No data recorded Additional Information for Danger to Others Potential: No data recorded Additional Comments for Danger to Others Potential: No data recorded Are There Guns or Other Weapons in Your Home? No data recorded Types of Guns/Weapons: No data recorded Are These Weapons Safely Secured?                            No data recorded Who Could Verify You Are Able To Have These Secured: No data recorded Do You Have any Outstanding Charges, Pending Court Dates, Parole/Probation? No data recorded Contacted To Inform of Risk of Harm To Self or Others: Other: Comment (Pt transfered to ED per  Hillery Jacks, NP recommendations)   Location of Assessment: [facility]   Does Patient Present under Involuntary Commitment? No  IVC Papers Initial File Date: No data recorded  [place] of Residence: [place]   Patient Currently Receiving the Following Services: Medication Management   Determination of Need: Emergent (2 hours)   Options For Referral: Medication Management;Outpatient Therapy     CCA Biopsychosocial  Intake/Chief Complaint:  CCA Intake With Chief Complaint CCA Part Two Date: 08/26/20 Chief Complaint/Presenting Problem: "All I want to do is sleep" Patient's Currently Reported Symptoms/Problems: Reports not being able to sleep Individual's Strengths: UTA Individual's Preferences: UTA Individual's Abilities: UTA Type of Services Patient Feels Are Needed: UTA  Mental Health Symptoms Depression:  Depression: Sleep (too much or little), Duration of symptoms greater than two weeks, Change in energy/activity, Difficulty Concentrating, Fatigue  Mania:  Mania: None  Anxiety:   Anxiety: Fatigue, Restlessness, Difficulty concentrating  Psychosis:  Psychosis: Affective flattening/alogia/avolition, Duration of symptoms greater than six months  Trauma:  Trauma: None  Obsessions:  Obsessions: None  Compulsions:  Compulsions: None  Inattention:  Inattention: None  Hyperactivity/Impulsivity:  Hyperactivity/Impulsivity: N/A  Oppositional/Defiant Behaviors:  Oppositional/Defiant Behaviors: N/A  Emotional Irregularity:  Emotional Irregularity: None  Other Mood/Personality Symptoms:      Mental Status Exam Appearance and self-care  Stature:  Stature: Average  Weight:  Weight: Overweight  Clothing:  Clothing: Age-appropriate, Dirty (Daughter reported that she had to help pt get dressed and changed today.)  Grooming:  Grooming: Neglected (per pt daughter report)  Cosmetic use:  Cosmetic Use: None  Posture/gait:  Posture/Gait: Tense  Motor activity:  Motor  Activity: Repetitive (rythmic rocking, unstable on feet)  Sensorium  Attention:  Attention: Confused, Unaware  Concentration:  Concentration: Scattered  Orientation:  Orientation: Place  Recall/memory:  Recall/Memory: Defective in Immediate, Defective in Short-term  Affect and Mood  Affect:  Affect: Anxious, Flat, Blunted  Mood:  Mood: Depressed, Anxious  Relating  Eye contact:  Eye Contact: Avoided  Facial expression:  Facial Expression: Constricted  Attitude toward examiner:  Attitude Toward Examiner: Cooperative  Thought and Language  Speech flow: Speech Flow: Slow, Paucity, Blocked  Thought content:     Preoccupation:  Preoccupations: None  Hallucinations:  Hallucinations: None  Organization:     Company secretaryxecutive Functions  Fund of Knowledge:  Fund of Knowledge: Impoverished by (Comment)  Intelligence:  Intelligence: Needs investigation  Abstraction:     Judgement:  Judgement: Impaired  Reality Testing:     Insight:  Insight: Unaware  Decision Making:  Decision Making: Confused  Social Functioning  Social Maturity:     Social Judgement:     Stress  Stressors:     Coping Ability:     Skill Deficits:  Skill Deficits: Activities of daily living, Communication, Decision making, Self-care  Supports:  Supports: Family     Religion: Religion/Spirituality Are You A Religious Person?:  (UTA) How Might This Affect Treatment?: UTA  Leisure/Recreation: Leisure / Recreation Do You Have Hobbies?:  (UTA)  Exercise/Diet: Exercise/Diet Do You Exercise?:  (UTA) Have You Gained or Lost A Significant Amount of Weight in the Past Six Months?:  (UTA) Do You Follow a Special Diet?:  (UTA) Do You Have Any Trouble Sleeping?: Yes Explanation of Sleeping Difficulties: Per pt daughter, pt is having a diffiuclt time sleeping. 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)  Education: Education Is Patient Currently Attending School?: No Last Grade Completed:  Industrial/product designer(UTA) Name of High School: UTA Did Garment/textile technologistYou Graduate From [employer] School?:  (UTA) Did You Attend College?:  (UTA) Did You Attend Graduate School?:  (UTA) Did You Have Any Special Interests In School?: UTA   CCA Family/Childhood History  Family and Relationship History: Family history Are you sexually active?:  (UTA) What is your sexual orientation?: UTA Has your sexual activity been affected by drugs, alcohol, medication, or emotional stress?: UTA Does patient have children?: Yes How many children?:  (UTA) How is patient's relationship with their children?: UTA  Childhood History:  Childhood History Additional childhood history information: UTA Description of patient's relationship with caregiver when they were a child: UTA Patient's description of current relationship with people who raised him/her: UTA How were you disciplined when you got in trouble as a child/adolescent?: UTA Does patient have siblings?:  (UTA)  Did patient suffer any verbal/emotional/physical/sexual abuse as a child?:  (UTA) Did patient suffer from severe childhood neglect?:  (UTA) Has patient ever been sexually abused/assaulted/raped as an adolescent or adult?:  (UTA) Was the patient ever a victim of a crime or a disaster?:  (UTA) Witnessed domestic violence?:  (UTA) Has patient been affected by domestic violence as an adult?:  Industrial/product designer)  Child/Adolescent Assessment:     CCA Substance Use  Alcohol/Drug Use: Alcohol / Drug Use Pain Medications: See MAR Prescriptions: See MAR Over the Counter: See MAR History of alcohol / drug use?: No  history of alcohol / drug abuse                         Recommendations for Services/Supports/Treatments:    DSM5 Diagnoses: Patient Active Problem List   Diagnosis Date Noted  . Mixed hyperlipidemia 02/20/2020  . Morbid obesity with BMI of 40.0-44.9, adult (HCC) 02/20/2020  . Bipolar 1 disorder, depressed, moderate (HCC) 02/20/2020  . Gastroesophageal reflux disease without esophagitis 02/20/2020  . Pedal edema 02/20/2020  . Hearing loss 07/03/2019  . Mild persistent asthma without complication 07/21/2016  . Essential hypertension, benign 07/21/2016  . Diverticulosis 03/09/2015    Disposition: Per Hillery Jacks, NP, patient appears to have an emergency medical condition provider recommends patient be transferred to the emergency department for further evaluation.    Quinlynn Cuthbert L Janee Morn

## 2020-08-26 NOTE — ED Provider Notes (Addendum)
[facility]-EMERGENCY DEPT Provider Note   CSN: [id number] Arrival date & time: 08/26/20  1707     History Chief Complaint  Patient presents with  . Medical Clearance    Cynthia [name] is a 59 y.o. female.  Patient is a 59 year old female with a history of bipolar disease, hypertension and GERD who was evaluated at behavioral health urgent care today and because of her behavior, lethargy they were concerned for a possible overdose and that she needed medical clearance.  Patient here is awake and alert but is sluggish.  She reports that she is just sleepy all the time.  She has felt depressed and states it has been going on for weeks.  She denies overdosing on any medications and reports she is taken her home medications this morning.  She does report that she has been eating but daughter reported that she is not taking care of her ADLs.  Patient denies feeling suicidal at this time.  She denies any chest pain, cough, fever, abdominal pain, nausea or vomiting.  She denies taking any over-the-counter medications.  She denies alcohol and drug use.  The history is provided by the patient, medical records and a relative.       Past Medical History:  Diagnosis Date  . Bipolar 1 disorder (HCC)   . Essential hypertension   . GERD (gastroesophageal reflux disease)     Patient Active Problem List   Diagnosis Date Noted  . Mixed hyperlipidemia 02/20/2020  . Morbid obesity with BMI of 40.0-44.9, adult (HCC) 02/20/2020  . Bipolar 1 disorder, depressed, moderate (HCC) 02/20/2020  . Gastroesophageal reflux disease without esophagitis 02/20/2020  . Pedal edema 02/20/2020  . Hearing loss 07/03/2019  . Mild persistent asthma without complication 07/21/2016  . Essential hypertension, benign 07/21/2016  . Diverticulosis 03/09/2015    History reviewed. No pertinent surgical history.   OB History   No obstetric history on file.     Family History  Problem Relation  Age of Onset  . Cancer Mother        lung  . Cancer Father        prostate  . Diabetes Paternal Grandfather     Social History   Tobacco Use  . Smoking status: Never Smoker  . Smokeless tobacco: Never Used  Substance Use Topics  . Alcohol use: Not Currently    Comment: rarely  . Drug use: Never    Home Medications Prior to Admission medications   Medication Sig Start Date End Date Taking? Authorizing Provider  albuterol (VENTOLIN HFA) 108 (90 Base) MCG/ACT inhaler Inhale 1-2 puffs into the lungs every 4 (four) hours as needed for wheezing or shortness of breath.    [provider]  furosemide (LASIX) 20 MG tablet Take 1 tablet by mouth once daily 03/21/20   Marianne Sofiaavis, Sara, PA-C  hydrochlorothiazide (HYDRODIURIL) 25 MG tablet Take 1 tablet by mouth once daily 04/09/20   Marianne Sofiaavis, Sara, PA-C  Oxcarbazepine (TRILEPTAL) 300 MG tablet Take 300 mg by mouth 2 (two) times daily. 08/16/20   [provider]    Allergies    Patient has no known allergies.  Review of Systems   Review of Systems  All other systems reviewed and are negative.   Physical Exam Updated Vital Signs BP (!) 130/109   Pulse (!) 101   Temp 98.2 F (36.8 C) (Oral)   Resp 18   Ht 5\' 3"  (1.6 m)   Wt 99.8 kg   SpO2 100%  BMI 38.97 kg/m   Physical Exam Vitals and nursing note reviewed.  Constitutional:      General: She is not in acute distress.    Appearance: She is well-developed.     Comments: Disheveled, poorly kempt  HENT:     Head: Normocephalic and atraumatic.     Right Ear: Tympanic membrane normal.     Left Ear: Tympanic membrane normal.  Eyes:     General:        Right eye: Discharge present.        Left eye: Discharge present.    Conjunctiva/sclera: Conjunctivae normal.     Pupils: Pupils are equal, round, and reactive to light.  Cardiovascular:     Rate and Rhythm: Normal rate and regular rhythm.     Pulses: Normal pulses.     Heart sounds: Normal heart sounds. No murmur  heard.  No friction rub.  Pulmonary:     Effort: Pulmonary effort is normal.     Breath sounds: Normal breath sounds. No wheezing or rales.  Abdominal:     General: Bowel sounds are normal. There is no distension.     Palpations: Abdomen is soft.     Tenderness: There is no abdominal tenderness. There is no guarding or rebound.  Musculoskeletal:        General: No tenderness. Normal range of motion.     Right lower leg: No edema.     Left lower leg: No edema.     Comments: No edema  Skin:    General: Skin is warm and dry.     Findings: No rash.  Neurological:     Mental Status: She is alert.     Cranial Nerves: No cranial nerve deficit.     Sensory: No sensory deficit.     Motor: No weakness.     Comments: Oriented to person and place  Psychiatric:        Mood and Affect: Mood is depressed. Affect is blunt and flat.        Speech: Speech is delayed.        Behavior: Behavior is slowed.        Thought Content: Thought content does not include homicidal or suicidal ideation.     ED Results / Procedures / Treatments   Labs (all labs ordered are listed, but only abnormal results are displayed) Labs Reviewed  CBC WITH DIFFERENTIAL/PLATELET - Abnormal; Notable for the following components:      Result Value   Platelets 436 (*)    All other components within normal limits  COMPREHENSIVE METABOLIC PANEL - Abnormal; Notable for the following components:   Potassium 2.2 (*)    Chloride 90 (*)    Glucose, Bld 127 (*)    BUN 23 (*)    Creatinine, Ser 1.15 (*)    GFR calc non Af Amer 53 (*)    All other components within normal limits  ACETAMINOPHEN LEVEL - Abnormal; Notable for the following components:   Acetaminophen (Tylenol), Serum <10 (*)    All other components within normal limits  SALICYLATE LEVEL - Abnormal; Notable for the following components:   Salicylate Lvl <7.0 (*)    All other components within normal limits  SARS CORONAVIRUS 2 BY RT PCR (HOSPITAL ORDER,  PERFORMED IN [facility])  ETHANOL  RAPID URINE DRUG SCREEN, HOSP PERFORMED    EKG EKG Interpretation  Date/Time:  Sunday August 26 2020 18:08:53 EDT Ventricular Rate:  98 PR Interval:  QRS Duration: 96 QT Interval:  367 QTC Calculation: 469 R Axis:   31 Text Interpretation: Sinus rhythm Multiform ventricular premature complexes Borderline prolonged PR interval Anteroseptal infarct, old Nonspecific repol abnormality, diffuse leads No significant change since last tracing Confirmed by Gwyneth Sprout ([id number]) on 08/26/2020 6:22:31 PM   Radiology No results found.  Procedures Procedures (including critical care time)  Medications Ordered in ED Medications - No data to display  ED Course  I have reviewed the triage vital signs and the nursing notes.  Pertinent labs & imaging results that were available during my care of the patient were reviewed by me and considered in my medical decision making (see chart for details).    MDM Rules/Calculators/A&P                          Patient presenting for medical clearance from behavioral health urgent care.  They were concerned for possible overdose however patient denies.  She is awake but has a very flat affect and is very depressed.  This seems to be more the cause of her symptoms.  Vital signs showed normal temperature and oxygen saturation.  Pulse and blood pressure on recheck are normal.  Patient is disheveled and unkempt.  She has no localized chest or abdominal pain.  Will check medical screening labs including an EKG and a Covid test however feel that she needs psych evaluation.  7:10 PM Labs with normal CBC, acetaminophen, salicylates and alcohol.  CMP with new hypokalemia of 2.2.  EKg without signficant changes.  Will check mag and will replace with oral and IV.  Will discuss with hospitalist for admission.  Most likely from lasix and hctz meds.  Pt does not take potassium replacement.  7:31 PM Discussed  with hospitalist but he recommended that we do the potassium replacement and recheck in a few hours and replace mag if needed and may be able to avoid admission.  Since pt is not having dizziness, nausea or vomiting and no alcohol hx this seems reasonable.  Will replace and recheck.  10:55 PM Will recheck potassium at midnight.  If normal pt is clear and consult psych.  She will need to discontinue lasix and only be on hctz and daily potassium.  MDM Number of Diagnoses or Management Options   Amount and/or Complexity of Data Reviewed Clinical lab tests: ordered and reviewed Tests in the medicine section of CPT: ordered and reviewed Decide to obtain previous medical records or to obtain history from someone other than the patient: yes Review and summarize past medical records: yes Discuss the patient with other providers: yes Independent visualization of images, tracings, or specimens: yes  Risk of Complications, Morbidity, and/or Mortality Presenting problems: moderate Diagnostic procedures: low Management options: moderate  Patient Progress Patient progress: stable  CRITICAL CARE Performed by: Emari Hreha Total critical care time: 30 minutes Critical care time was exclusive of separately billable procedures and treating other patients. Critical care was necessary to treat or prevent imminent or life-threatening deterioration. Critical care was time spent personally by me on the following activities: development of treatment plan with patient and/or surrogate as well as nursing, discussions with consultants, evaluation of patient's response to treatment, examination of patient, obtaining history from patient or surrogate, ordering and performing treatments and interventions, ordering and review of laboratory studies, ordering and review of radiographic studies, pulse oximetry and re-evaluation of patient's condition.   Final Clinical Impression(s) / ED Diagnoses Final  diagnoses:  Depression, unspecified depression type  Hypokalemia    Rx / DC Orders ED Discharge Orders    None       Gwyneth Sprout, MD 08/26/20 1610    Gwyneth Sprout, MD 08/26/20 2256

## 2020-08-26 NOTE — ED Provider Notes (Signed)
Behavioral Health Urgent Care Medical Screening Exam  Patient Name: Cynthia [name] MRN: [id number] Date of Evaluation: 08/26/20 Chief Complaint:   Diagnosis:  Final diagnoses:  None    History of Present illness: Cynthia [name] is a 59 y.o. female. Presented to [facility] accompanied by her daughter.  [name] is very minimal throughout this assessment.  Appears to be despondent. Reported history with depression and bipolar disorder.  Patient is unable to recall which medication she is taking this morning.  She reports " I just do not feel well."  [name] is awake and alert x 3. She denied suicidal or homicidal ideations.  Denies auditory or visual hallucinations.   TTS counselor obtained additional collateral from daughter reported previous suicide attempts.  Unsure of which medications patient has been prescribed in the past. Reports poor ADLs and a history with anxiety and depression  and states patient has not been sleeping. Daughter reported patient resides alone and she did a "welfare check to day."  Patient  to be transported to the local emergency department for medical clearances. Support, encouragement and reassurance was provided.   Psychiatric Specialty Exam  Presentation  General Appearance:Bizarre  Eye Contact:Minimal  Speech:Slow  Speech Volume:Decreased  Handedness:Right   Mood and Affect  Mood:Dysphoric  Affect:Flat;Constricted   Thought Process  Thought Processes:Disorganized  Descriptions of Associations:Intact  Orientation:Full (Time, Place and Person)  Thought Content:Logical  Hallucinations:None  Ideas of Reference:None  Suicidal Thoughts:No  Homicidal Thoughts:No   Sensorium  Memory:Immediate Poor;Recent Poor;Remote Poor  Judgment:Impaired  Insight:Poor   Executive Functions  Concentration:Poor  Attention Span:Poor  Recall:Poor  Fund of Knowledge:Poor  Language:Poor   Psychomotor  Activity  Psychomotor Activity:Restlessness   Assets  Assets:Social Support   Sleep  Sleep:Poor  Number of hours: 3   Physical Exam: Physical Exam ROS Blood pressure 110/86, pulse 93, temperature (!) 97.5 F (36.4 C), temperature source Tympanic, height 5\' 3"  (1.6 m), weight 220 lb (99.8 kg), SpO2 97 %. Body mass index is 38.97 kg/m.  Musculoskeletal: Strength & Muscle Tone: within normal limits Gait & Station: unsteady Patient leans: N/A   [facility] MSE Discharge Disposition for Follow up and Recommendations: Based on my evaluation the patient appears to have an emergency medical condition for which I recommend the patient be transferred to the emergency department for further evaluation.    -EMS called for medical clearances    [facility], NP 08/26/2020, 4:10 PM

## 2020-08-26 NOTE — BHH Counselor (Signed)
Pt sent to [facility] for medical evaluation.

## 2020-08-26 NOTE — Discharge Instructions (Addendum)
Take all medications as prescribed. Keep all follow-up appointments as scheduled.  Do not consume alcohol or use illegal drugs while on prescription medications. Report any adverse effects from your medications to your primary care provider promptly.  In the event of recurrent symptoms or worsening symptoms, call 911, a crisis hotline, or go to the nearest emergency department for evaluation.   

## 2020-08-26 NOTE — ED Triage Notes (Signed)
BIBA: Per EMS: Pt coming from [facility].  Daughter and facility are concerned of possible medication overdose. Daughter states pt has been more lethargic than usual lately. Pt denies SI  Vitals: 126/96 96% room air 97.2 temp HR 100 RR 16 CBG 117

## 2020-08-27 ENCOUNTER — Encounter (HOSPITAL_COMMUNITY): Payer: Self-pay | Admitting: Registered Nurse

## 2020-08-27 DIAGNOSIS — F3130 Bipolar disorder, current episode depressed, mild or moderate severity, unspecified: Secondary | ICD-10-CM | POA: Diagnosis present

## 2020-08-27 LAB — RAPID URINE DRUG SCREEN, HOSP PERFORMED
Amphetamines: NOT DETECTED
Barbiturates: NOT DETECTED
Benzodiazepines: NOT DETECTED
Cocaine: NOT DETECTED
Opiates: NOT DETECTED
Tetrahydrocannabinol: NOT DETECTED

## 2020-08-27 LAB — MAGNESIUM: Magnesium: 1.7 mg/dL (ref 1.7–2.4)

## 2020-08-27 LAB — POTASSIUM
Potassium: 2.8 mmol/L — ABNORMAL LOW (ref 3.5–5.1)
Potassium: 2.9 mmol/L — ABNORMAL LOW (ref 3.5–5.1)

## 2020-08-27 MED ORDER — POTASSIUM CHLORIDE 10 MEQ/100ML IV SOLN
10.0000 meq | INTRAVENOUS | Status: AC
  Administered 2020-08-27 (×4): 10 meq via INTRAVENOUS
  Filled 2020-08-27 (×4): qty 100

## 2020-08-27 MED ORDER — POTASSIUM CHLORIDE CRYS ER 20 MEQ PO TBCR
40.0000 meq | EXTENDED_RELEASE_TABLET | Freq: Two times a day (BID) | ORAL | Status: DC
  Administered 2020-08-27 – 2020-08-28 (×3): 40 meq via ORAL
  Filled 2020-08-27 (×3): qty 2

## 2020-08-27 MED ORDER — POTASSIUM CHLORIDE CRYS ER 20 MEQ PO TBCR
40.0000 meq | EXTENDED_RELEASE_TABLET | Freq: Once | ORAL | Status: AC
  Administered 2020-08-27: 40 meq via ORAL
  Filled 2020-08-27: qty 2

## 2020-08-27 MED ORDER — HYDROCHLOROTHIAZIDE 12.5 MG PO CAPS
25.0000 mg | ORAL_CAPSULE | Freq: Every day | ORAL | Status: DC
  Administered 2020-08-27 – 2020-08-28 (×2): 25 mg via ORAL
  Filled 2020-08-27 (×2): qty 2

## 2020-08-27 MED ORDER — POTASSIUM CHLORIDE CRYS ER 20 MEQ PO TBCR: 20.0000 meq | EXTENDED_RELEASE_TABLET | Freq: Two times a day (BID) | ORAL | 0 refills | Status: DC

## 2020-08-27 MED ORDER — CLONAZEPAM 0.5 MG PO TABS
0.5000 mg | ORAL_TABLET | Freq: Two times a day (BID) | ORAL | Status: DC
  Administered 2020-08-27 – 2020-08-28 (×3): 0.5 mg via ORAL
  Filled 2020-08-27 (×3): qty 1

## 2020-08-27 MED ORDER — OXCARBAZEPINE 300 MG PO TABS
300.0000 mg | ORAL_TABLET | Freq: Two times a day (BID) | ORAL | Status: DC
  Administered 2020-08-27 – 2020-08-28 (×3): 300 mg via ORAL
  Filled 2020-08-27 (×3): qty 1

## 2020-08-27 MED ORDER — DIVALPROEX SODIUM 500 MG PO DR TAB
500.0000 mg | DELAYED_RELEASE_TABLET | Freq: Two times a day (BID) | ORAL | Status: DC
  Administered 2020-08-27 – 2020-08-28 (×3): 500 mg via ORAL
  Filled 2020-08-27 (×3): qty 1

## 2020-08-27 MED ORDER — ARIPIPRAZOLE 5 MG PO TABS
5.0000 mg | ORAL_TABLET | Freq: Two times a day (BID) | ORAL | Status: DC
  Administered 2020-08-27 – 2020-08-28 (×2): 5 mg via ORAL
  Filled 2020-08-27 (×2): qty 1

## 2020-08-27 MED ORDER — MAGNESIUM OXIDE 400 (241.3 MG) MG PO TABS
800.0000 mg | ORAL_TABLET | Freq: Every day | ORAL | Status: DC
  Administered 2020-08-27 – 2020-08-28 (×2): 800 mg via ORAL
  Filled 2020-08-27 (×2): qty 2

## 2020-08-27 MED ORDER — ARIPIPRAZOLE 10 MG PO TABS
10.0000 mg | ORAL_TABLET | Freq: Every morning | ORAL | Status: DC
  Administered 2020-08-27: 10 mg via ORAL
  Filled 2020-08-27: qty 1

## 2020-08-27 NOTE — BH Assessment (Signed)
BHH Assessment Progress Note  Per Shuvon Rankin, NP, this voluntary pt requires psychiatric hospitalization at this time.  Percell Boston, RN has pre-assigned pt to [facility], however, she notes that [facility] may not be ready to receive pt until tomorrow.  Pt's nurse, Waldo Laine, has been notified and agrees to have pt sign Voluntary Admission and Consent for Treatment.  Original signed form is to be sent along with pt via [facility], and report to be called to [phone].  Doylene Canning, [place] Behavioral Health Coordinator [phone]

## 2020-08-27 NOTE — BHH Counselor (Signed)
Per Assunta Found, NP confirm she spoke with pt today and collateral information is needed from pt daughter before disposition completed.

## 2020-08-27 NOTE — Consult Note (Addendum)
[facility] Face-to-Face Psychiatry Consult   Reason for Consult:  Suicidal ideation Referring Physician:  Sabas Sous, MD Patient Identification: Cynthia [name] MRN:  [id number] Principal Diagnosis: Bipolar I disorder, most recent episode (or current) depressed (HCC) Diagnosis:  Principal Problem:   Bipolar I disorder, most recent episode (or current) depressed (HCC)   Total Time spent with patient: 30 minutes  Subjective:   Cynthia [name] is a 59 y.o. female patient admitted to [facility] ED being sent from [facility] for medical clearance after she presented accompanied by her daughter with complaints of suicidal ideation but patient only participating minimal   HPI:  Cynthia [name], 59 y.o., female patient seen via tele psych by this provider, consulted with Dr. Lucianne Muss; and chart reviewed on 08/27/20.  On evaluation Cynthia [name] reports she is unsure of why she was brought to the hospital other than "I was sleeping to much and my potassium was low." Patient states she lives alone.  Patient appears to be groggy and unable to give much information to why she is in the hospital.   During evaluation Cynthia [name] is alert/oriented x 3; calm/cooperative; and mood appears depressed; with flat affect.  She does not appear to be responding to internal/external stimuli or delusional thoughts; but she does have and involuntary movement of hips (up/down) motion.  She denies suicidal/self-harm/homicidal ideation, psychosis, and paranoia.  Patient gave permission to speak to her daughter for collateral information.     Collateral information:  Spoke to patients daughter Morene Crocker at [phone])  Daughter states that she lives an hour away and was just coming to visit yesterday; when she found her mother unkept, barely able to stand or walk.  States that her mother told her that she had been unable to sleep or sit.  States she is aware that there have been multiple medication changes recently  and she is unsure if her mother is taking to much or not enough of her medications.  States that she is not even sure what medications she should be taking now.  States she has tried several time to call patients doctor but they've been playing phone tag.  When asked about the involuntary movements of her mother hip; daughter responded "That is some thing new sort of like a thrusting up of up of her hips.  She usually sways on her feet but this hip movement is new.  States that her mother lives alone and doesn't have anyone that is trust worthy or reliable close by to check on her.  States that she isn't worried that her mother tried to kill herself "She has said stuff like she wish she would go to sleep and wake up but she told me I don't ever have to worry about her killing herself casue she was scared to do something like that."  States that her mother is usually able to care for herself; but the condition she is in now she would be afraid for her to be home alone.    Past Psychiatric History: See above  Risk to Self:  No Risk to Others:  No Prior Inpatient Therapy:   Yes Prior Outpatient Therapy:  Yes  Past Medical History:  Past Medical History:  Diagnosis Date  . Bipolar 1 disorder (HCC)   . Essential hypertension   . GERD (gastroesophageal reflux disease)    History reviewed. No pertinent surgical history. Family History:  Family History  Problem Relation Age of Onset  . Cancer  Mother        lung  . Cancer Father        prostate  . Diabetes Paternal Grandfather    Family Psychiatric  History: Unaware Social History:  Social History   Substance and Sexual Activity  Alcohol Use Not Currently   Comment: rarely     Social History   Substance and Sexual Activity  Drug Use Never    Social History   Socioeconomic History  . Marital status: Divorced    Spouse name: Not on file  . Number of children: 3  . Years of education: Not on file  . Highest education level: Not on  file  Occupational History  . Not on file  Tobacco Use  . Smoking status: Never Smoker  . Smokeless tobacco: Never Used  Substance and Sexual Activity  . Alcohol use: Not Currently    Comment: rarely  . Drug use: Never  . Sexual activity: Not on file  Other Topics Concern  . Not on file  Social History Narrative  . Not on file   Social Determinants of Health   Financial Resource Strain:   . Difficulty of Paying Living Expenses: Not on file  Food Insecurity:   . Worried About Programme researcher, broadcasting/film/video in the Last Year: Not on file  . Ran Out of Food in the Last Year: Not on file  Transportation Needs:   . Lack of Transportation (Medical): Not on file  . Lack of Transportation (Non-Medical): Not on file  Physical Activity:   . Days of Exercise per Week: Not on file  . Minutes of Exercise per Session: Not on file  Stress:   . Feeling of Stress : Not on file  Social Connections:   . Frequency of Communication with Friends and Family: Not on file  . Frequency of Social Gatherings with Friends and Family: Not on file  . Attends Religious Services: Not on file  . Active Member of Clubs or Organizations: Not on file  . Attends Banker Meetings: Not on file  . Marital Status: Not on file   Additional Social History:    Allergies:  No Known Allergies  Labs:  Results for orders placed or performed during the hospital encounter of 08/26/20 (from the past 48 hour(s))  CBC with Differential/Platelet     Status: Abnormal   Collection Time: 08/26/20  6:15 PM  Result Value Ref Range   WBC 9.8 4.0 - 10.5 K/uL   RBC 4.85 3.87 - 5.11 MIL/uL   Hemoglobin 14.5 12.0 - 15.0 g/dL   HCT 16.1 36 - 46 %   MCV 86.6 80.0 - 100.0 fL   MCH 29.9 26.0 - 34.0 pg   MCHC 34.5 30.0 - 36.0 g/dL   RDW 09.6 04.5 - 40.9 %   Platelets 436 (H) 150 - 400 K/uL   nRBC 0.0 0.0 - 0.2 %   Neutrophils Relative % 60 %   Neutro Abs 6.0 1.7 - 7.7 K/uL   Lymphocytes Relative 31 %   Lymphs Abs 3.0 0.7 -  4.0 K/uL   Monocytes Relative 7 %   Monocytes Absolute 0.7 0 - 1 K/uL   Eosinophils Relative 1 %   Eosinophils Absolute 0.1 0 - 0 K/uL   Basophils Relative 1 %   Basophils Absolute 0.1 0 - 0 K/uL   Immature Granulocytes 0 %   Abs Immature Granulocytes 0.04 0.00 - 0.07 K/uL    Comment: Performed at [facility], [address] [place], [place] [address]  Comprehensive metabolic panel     Status: Abnormal   Collection Time: 08/26/20  6:15 PM  Result Value Ref Range   Sodium 135 135 - 145 mmol/L   Potassium 2.2 (LL) 3.5 - 5.1 mmol/L    Comment: CRITICAL RESULT CALLED TO, READ BACK BY AND VERIFIED WITH: K.ZULETA,RN [id number] @1902  BY V.WILKINS    Chloride 90 (L) 98 - 111 mmol/L   CO2 31 22 - 32 mmol/L   Glucose, Bld 127 (H) 70 - 99 mg/dL    Comment: Glucose reference range applies only to samples taken after fasting for at least 8 hours.   BUN 23 (H) 6 - 20 mg/dL   Creatinine, Ser 7.821.15 (H) 0.44 - 1.00 mg/dL   Calcium 9.3 8.9 - 95.610.3 mg/dL   Total Protein 7.8 6.5 - 8.1 g/dL   Albumin 4.2 3.5 - 5.0 g/dL   AST 19 15 - 41 U/L   ALT 17 0 - 44 U/L   Alkaline Phosphatase 84 38 - 126 U/L   Total Bilirubin 0.8 0.3 - 1.2 mg/dL   GFR calc non Af Amer 53 (L) >60 mL/min   GFR calc Af Amer >60 >60 mL/min   Anion gap 14 5 - 15    Comment: Performed at [facility], [address] [place], [place] [address]  Ethanol     Status: None   Collection Time: 08/26/20  6:16 PM  Result Value Ref Range   Alcohol, Ethyl (B) <10 <10 mg/dL    Comment: (NOTE) Lowest detectable limit for serum alcohol is 10 mg/dL.  For medical purposes only. Performed at [facility], [address] [place], [place] [address]   Acetaminophen level     Status: Abnormal   Collection Time: 08/26/20  6:16 PM  Result Value Ref Range   Acetaminophen (Tylenol), Serum <10 (L) 10 - 30 ug/mL    Comment: (NOTE) Therapeutic concentrations vary significantly. A range of  10-30 ug/mL  may be an effective concentration for many patients. However, some  are best treated at concentrations outside of this range. Acetaminophen concentrations >150 ug/mL at 4 hours after ingestion  and >50 ug/mL at 12 hours after ingestion are often associated with  toxic reactions.  Performed at [facility], [address] [place], [place] [address]   Salicylate level     Status: Abnormal   Collection Time: 08/26/20  6:16 PM  Result Value Ref Range   Salicylate Lvl <7.0 (L) 7.0 - 30.0 mg/dL    Comment: Performed at [facility], [address] [place], [place] [address]  SARS Coronavirus 2 by RT PCR (hospital order, performed in [facility] hospital lab) Nasopharyngeal Nasopharyngeal Swab     Status: None   Collection Time: 08/26/20  6:18 PM   Specimen: Nasopharyngeal Swab  Result Value Ref Range   SARS Coronavirus 2 NEGATIVE NEGATIVE    Comment: (NOTE) SARS-CoV-2 target nucleic acids are NOT DETECTED.  The SARS-CoV-2 RNA is generally detectable in upper and lower respiratory specimens during the acute phase of infection. The lowest concentration of SARS-CoV-2 viral copies this assay can detect is 250 copies / mL. A negative result does not preclude SARS-CoV-2 infection and should not be used as the sole basis for treatment or other patient management decisions.  A negative result may occur with improper specimen collection / handling, submission of specimen other than nasopharyngeal swab, presence of viral mutation(s)  within the areas targeted by this assay, and inadequate number of viral copies (<250 copies / mL). A negative result must be combined with clinical observations, patient history, and epidemiological information.  Fact Sheet for Patients:   BoilerBrush.com.cy  Fact Sheet for Healthcare Providers: https://pope.com/  This test is not yet approved or  cleared by the  [place] FDA and has been authorized for detection and/or diagnosis of SARS-CoV-2 by FDA under an Emergency Use Authorization (EUA).  This EUA will remain in effect (meaning this test can be used) for the duration of the COVID-19 declaration under Section 564(b)(1) of the Act, 21 U.S.C. section 360bbb-3(b)(1), unless the authorization is terminated or revoked sooner.  Performed at [facility], [address], [place], [place] [address]   Potassium     Status: Abnormal   Collection Time: 08/27/20 12:30 AM  Result Value Ref Range   Potassium 2.8 (L) 3.5 - 5.1 mmol/L    Comment: DELTA CHECK NOTED Performed at [facility], [address], [place], [place] [address]   Magnesium     Status: None   Collection Time: 08/27/20 12:30 AM  Result Value Ref Range   Magnesium 1.7 1.7 - 2.4 mg/dL    Comment: Performed at [facility], [address], [place], [place] [address]  Potassium     Status: Abnormal   Collection Time: 08/27/20  7:31 AM  Result Value Ref Range   Potassium 2.9 (L) 3.5 - 5.1 mmol/L    Comment: Performed at [facility], [address], [place], [place] [address]    Current Facility-Administered Medications  Medication Dose Route Frequency Provider Last Rate Last Admin  . ARIPiprazole (ABILIFY) tablet 5 mg  5 mg Oral BID Barnabas Henriques B, NP      . clonazePAM (KLONOPIN) tablet 0.5 mg  0.5 mg Oral BID Alexandera Kuntzman B, NP      . divalproex (DEPAKOTE) DR tablet 500 mg  500 mg Oral BID Sabas Sous, MD   500 mg at 08/27/20 1151  . hydrochlorothiazide (MICROZIDE) capsule 25 mg  25 mg Oral Daily Sabas Sous, MD   25 mg at 08/27/20 1150  . magnesium oxide (MAG-OX) tablet 800 mg  800 mg Oral Daily Sabas Sous, MD   800 mg at 08/27/20 1023  . Oxcarbazepine (TRILEPTAL) tablet 300 mg  300 mg Oral BID Sabas Sous, MD   300 mg at 08/27/20 1151  . potassium chloride SA (KLOR-CON) CR tablet  40 mEq  40 mEq Oral BID Sabas Sous, MD   40 mEq at 08/27/20 1023   Current Outpatient Medications  Medication Sig Dispense Refill  . ARIPiprazole (ABILIFY) 10 MG tablet Take 10 mg by mouth every morning.    . hydrochlorothiazide (HYDRODIURIL) 25 MG tablet Take 1 tablet by mouth once daily 90 tablet 0  . Oxcarbazepine (TRILEPTAL) 300 MG tablet Take 300 mg by mouth 2 (two) times daily.    Marland Kitchen albuterol (VENTOLIN HFA) 108 (90 Base) MCG/ACT inhaler Inhale 1-2 puffs into the lungs every 4 (four) hours as needed for wheezing or shortness of breath. (Patient not taking: Reported on 08/27/2020)    . divalproex (DEPAKOTE) 500 MG DR tablet Take 500 mg by mouth 2 (two) times daily. (Patient not taking: Reported on 08/27/2020)    . furosemide (LASIX) 20 MG tablet Take 1 tablet by mouth once daily (Patient not taking: Reported on 08/27/2020) 90 tablet 0  . LATUDA  40 MG TABS tablet Take 40 mg by mouth at bedtime. (Patient not taking: Reported on 08/27/2020)    . potassium chloride SA (KLOR-CON) 20 MEQ tablet Take 1 tablet (20 mEq total) by mouth 2 (two) times daily for 7 days. 14 tablet 0  . QUEtiapine (SEROQUEL XR) 400 MG 24 hr tablet Take 400 mg by mouth at bedtime. (Patient not taking: Reported on 08/27/2020)    . risperiDONE (RISPERDAL) 3 MG tablet Take 6 mg by mouth at bedtime. (Patient not taking: Reported on 08/27/2020)      Musculoskeletal: Strength & Muscle Tone: within normal limits Gait & Station: Did not see patient ambulate Patient leans: N/A  Psychiatric Specialty Exam: Physical Exam Vitals and nursing note reviewed. Exam conducted with a chaperone present.  HENT:     Head: Normocephalic.  Musculoskeletal:        General: Normal range of motion.     Cervical back: Normal range of motion.     Comments: Patient appears to have some involuntary movement from the waist down (up/down) hip  Skin:    General: Skin is warm and dry.  Neurological:     Mental Status: She is alert.  Psychiatric:         Mood and Affect: Mood is depressed.        Speech: Speech is delayed.        Behavior: Behavior is cooperative.        Thought Content: Thought content is not paranoid or delusional. Thought content does not include homicidal or suicidal ideation.        Cognition and Memory: Memory is impaired.     Review of Systems  Psychiatric/Behavioral: Agitation: denies. Behavioral problem: denies. Hallucinations: denies. Self-injury: denies. Sleep disturbance: denies. Suicidal ideas: denies.       Patient stating that she is unsure why she was brought to the hospital other than her potassium being low  All other systems reviewed and are negative.   Blood pressure (!) 101/52, pulse 76, temperature 98.2 F (36.8 C), temperature source Oral, resp. rate 12, height 5\' 3"  (1.6 m), weight 99.8 kg, SpO2 96 %.Body mass index is 38.97 kg/m.  General Appearance: Casual  Eye Contact:  Minimal  Speech:  Clear and Coherent and Normal Rate  Volume:  Decreased  Mood:  Depressed  Affect:  Depressed and Flat  Thought Process:  Coherent and Linear  Orientation:  Full (Time, Place, and Person)  Thought Content:  Patient denies hallucinations, delusions, and paranoia  Suicidal Thoughts:  No  Homicidal Thoughts:  No  Memory:  Immediate;   Fair Recent;   Fair  Judgement:  Fair  Insight:  Fair  Psychomotor Activity:  EPS and Decreased  Concentration:  Concentration: Poor and Attention Span: Poor  Recall:  of Knowledge:  Fair  Language:  Fair  Akathisia:  No  Handed:  Right  AIMS (if indicated):     Assets:  Communication Skills Desire for Improvement Housing Social Support  ADL's:  Intact  Cognition:  WNL  Sleep:      Treatment Plan Summary: Daily contact with patient to assess and evaluate symptoms and progress in treatment, Medication management and Plan Inpatient psychiatric treatment   Recommendation:  Continue Depakote 500 mg Bid, Changed Abilify to 5 mg bid.  Added Klonopin  0.5 mg Bid for abnormal movement (EPS).  Continue to assess movement see if any improvement by tomorrow.    Disposition: Recommend psychiatric Inpatient admission when medically cleared.  Ryson Bacha, NP 08/27/2020 3:27 PM

## 2020-08-27 NOTE — ED Notes (Signed)
Patient ambulatory to bathroom.

## 2020-08-27 NOTE — ED Provider Notes (Signed)
  Provider Note MRN:  [id number]  Arrival date & time: 08/27/20    ED Course and Medical Decision Making  Assumed care from Dr. Read Drivers at shift change.  Drug overdose, IVC'ed, had some profound hypokalemia which we are correcting.  Plan is to check potassium at 7 AM and if medically cleared can consult TTS.  Repeat potassium this morning is 2.9.  Patient has exhibited no hemodynamic instability, no cardiac ectopy, and with this improving and stable potassium level I feel that she is medically cleared, will need continued daily potassium supplementation but this would be an outpatient management strategy.  She is now appropriate for TTS evaluation.  Signed out to default provider.  Procedures  Final Clinical Impressions(s) / ED Diagnoses     ICD-10-CM   1. Depression, unspecified depression type  F32.9   2. Hypokalemia  E87.6     ED Discharge Orders         Ordered    potassium chloride SA (KLOR-CON) 20 MEQ tablet  2 times daily        08/27/20 1113          Discharge Instructions   None     Elmer Sow. Pilar Plate, MD [facility] Emergency Medicine [facility] [email or web address]    Sabas Sous, MD 08/27/20 1114

## 2020-08-28 ENCOUNTER — Inpatient Hospital Stay (HOSPITAL_COMMUNITY): Payer: Medicare HMO

## 2020-08-28 ENCOUNTER — Inpatient Hospital Stay (HOSPITAL_COMMUNITY)
Admission: AD | Admit: 2020-08-28 | Discharge: 2020-09-07 | DRG: 885 | Disposition: A | Discharge status: Home / Self Care | Payer: Medicare HMO | Source: Intra-hospital | Attending: Psychiatry | Admitting: Psychiatry

## 2020-08-28 ENCOUNTER — Other Ambulatory Visit: Payer: Self-pay | Admitting: Behavioral Health

## 2020-08-28 ENCOUNTER — Other Ambulatory Visit: Payer: Self-pay

## 2020-08-28 DIAGNOSIS — G319 Degenerative disease of nervous system, unspecified: Secondary | ICD-10-CM | POA: Diagnosis not present

## 2020-08-28 DIAGNOSIS — W19XXXA Unspecified fall, initial encounter: Secondary | ICD-10-CM | POA: Diagnosis not present

## 2020-08-28 DIAGNOSIS — Z8042 Family history of malignant neoplasm of prostate: Secondary | ICD-10-CM

## 2020-08-28 DIAGNOSIS — Z833 Family history of diabetes mellitus: Secondary | ICD-10-CM

## 2020-08-28 DIAGNOSIS — G4700 Insomnia, unspecified: Secondary | ICD-10-CM | POA: Diagnosis not present

## 2020-08-28 DIAGNOSIS — E039 Hypothyroidism, unspecified: Secondary | ICD-10-CM | POA: Diagnosis not present

## 2020-08-28 DIAGNOSIS — T887XXA Unspecified adverse effect of drug or medicament, initial encounter: Secondary | ICD-10-CM

## 2020-08-28 DIAGNOSIS — I10 Essential (primary) hypertension: Secondary | ICD-10-CM | POA: Diagnosis not present

## 2020-08-28 DIAGNOSIS — R262 Difficulty in walking, not elsewhere classified: Secondary | ICD-10-CM | POA: Diagnosis not present

## 2020-08-28 DIAGNOSIS — Z9151 Personal history of suicidal behavior: Secondary | ICD-10-CM

## 2020-08-28 DIAGNOSIS — T50995A Adverse effect of other drugs, medicaments and biological substances, initial encounter: Secondary | ICD-10-CM | POA: Diagnosis not present

## 2020-08-28 DIAGNOSIS — J449 Chronic obstructive pulmonary disease, unspecified: Secondary | ICD-10-CM | POA: Diagnosis not present

## 2020-08-28 DIAGNOSIS — Z79899 Other long term (current) drug therapy: Secondary | ICD-10-CM | POA: Diagnosis not present

## 2020-08-28 DIAGNOSIS — F329 Major depressive disorder, single episode, unspecified: Secondary | ICD-10-CM | POA: Diagnosis not present

## 2020-08-28 DIAGNOSIS — Z0279 Encounter for issue of other medical certificate: Secondary | ICD-10-CM | POA: Diagnosis not present

## 2020-08-28 DIAGNOSIS — G9389 Other specified disorders of brain: Secondary | ICD-10-CM | POA: Diagnosis not present

## 2020-08-28 DIAGNOSIS — Z801 Family history of malignant neoplasm of trachea, bronchus and lung: Secondary | ICD-10-CM

## 2020-08-28 DIAGNOSIS — S0990XA Unspecified injury of head, initial encounter: Secondary | ICD-10-CM | POA: Diagnosis not present

## 2020-08-28 DIAGNOSIS — E785 Hyperlipidemia, unspecified: Secondary | ICD-10-CM | POA: Diagnosis not present

## 2020-08-28 DIAGNOSIS — F3132 Bipolar disorder, current episode depressed, moderate: Secondary | ICD-10-CM | POA: Diagnosis not present

## 2020-08-28 DIAGNOSIS — K219 Gastro-esophageal reflux disease without esophagitis: Secondary | ICD-10-CM | POA: Diagnosis not present

## 2020-08-28 DIAGNOSIS — R69 Illness, unspecified: Secondary | ICD-10-CM | POA: Diagnosis not present

## 2020-08-28 DIAGNOSIS — E876 Hypokalemia: Secondary | ICD-10-CM | POA: Diagnosis not present

## 2020-08-28 DIAGNOSIS — Z20822 Contact with and (suspected) exposure to covid-19: Secondary | ICD-10-CM | POA: Diagnosis not present

## 2020-08-28 LAB — BASIC METABOLIC PANEL
Anion gap: 12 (ref 5–15)
BUN: 11 mg/dL (ref 6–20)
CO2: 27 mmol/L (ref 22–32)
Calcium: 9.3 mg/dL (ref 8.9–10.3)
Chloride: 101 mmol/L (ref 98–111)
Creatinine, Ser: 0.75 mg/dL (ref 0.44–1.00)
GFR calc Af Amer: >60 mL/min (ref >60)
GFR calc non Af Amer: >60 mL/min (ref >60)
Glucose, Bld: 116 mg/dL — ABNORMAL HIGH (ref 70–99)
Potassium: 3.8 mmol/L (ref 3.5–5.1)
Sodium: 140 mmol/L (ref 135–145)

## 2020-08-28 LAB — CBG MONITORING, ED: Glucose-Capillary: 86 mg/dL (ref 70–99)

## 2020-08-28 NOTE — ED Notes (Signed)
Pt's bed alarm went off 3x because pt is trying to sit up and get out of the bed.

## 2020-08-28 NOTE — Progress Notes (Signed)
Patient ID: Cynthia [name], female   DOB: 1961/04/01, 59 y.o.   MRN: [id number]   Patient admitted to [facility] 08/28/2020. Shortly after arrival, patient fell while in her room (fall unwitnessed). Patient stated that she became dizzy prior to falling.On evaluation by this NP, patient stated that she fell forwards and hit her head. She presented with a red spot to her forehead which she stated was not from the fall but in-fact, a rash. There were no further brusing noted. Patient unstable on her feet during ambulation and lethargic. While writing this note nursing came and stated patient almost rolled out of bed and now staff is sitting with patient. Patient also noted to rock back and forth with slight tremors. She reported taking her medications 2 hours prior to this event. She denied history of seizures. Patient medically cleared by EDP per chart review prior to transport to [facility] however, with new syncope episode and lethargy, concerns are raised. Patient will be transported back to the ED for additional medical clearance. Evaluation by neurology may be beneficial  Once cleared, she can return back to [facility].

## 2020-08-28 NOTE — BH Assessment (Signed)
BHH Assessment Progress Note  Per Gretta Arab, RN, AC, pt's bed assignment has been changed to 305-2.  Pt's nurse, Dawn, has been notified.  Doylene Canning, [place] Behavioral Health Coordinator [phone]

## 2020-08-28 NOTE — ED Notes (Signed)
ED Provider at bedside.  Bero, MD removed cervical collar.

## 2020-08-28 NOTE — Progress Notes (Addendum)
Pt arrived at [facility] extremely lethargic, slurred speech, unable to keep her eyes open.Pt reported pain at a 6 out of 10 in her hips.  Pt is unable to speak in full sentences and answers mostly with yes or no responses with much prompting.  Pt brought to her room after initial assessment was completed.  A few minutes after being in her room, MHT found patient lying on the floor on her stomach.  Pt reported that she hit her head on the floor when she fell.  AC spoke with NP about situation described above  and decision was made to send pt to [facility] ED for evaluation post fall.  AC to call charge nurse at [facility] ED.

## 2020-08-28 NOTE — Discharge Instructions (Signed)
Medically cleared with normal CT head and EKG, somnolence suspected due to medication side effects from this morning.  Appropriate for discharge back to behavioral health hospital for continued psychiatric care.

## 2020-08-28 NOTE — Progress Notes (Addendum)
Received return call from patient's daughter concerning patient's fall.  She states EMS called her and informed her of the incident.  She also reports patient has been "unsteady on her feet for a while" and has not been able to perform her ADL's.

## 2020-08-28 NOTE — Progress Notes (Signed)
Received report from [facility] RN. States pt is medically cleared and ready to return to [facility].

## 2020-08-28 NOTE — ED Notes (Addendum)
Pt BIB EMS from [facility] due to a fall, hit head. No LOC. Concern that she is lethargic and unsteady. Pt has C Collar on by EMS. Pt is alert per normal upon arrival. No noted injury from fall. 103/60-100-20-CBG 106 98% RA

## 2020-08-28 NOTE — ED Provider Notes (Signed)
Emergency Medicine Observation Re-evaluation Note  Cynthia [name] is a 59 y.o. female, seen on rounds today.  Pt initially presented to the ED for complaints of Medical Clearance Currently, the patient is sleeping.  Physical Exam  BP 102/62   Pulse 83   Temp 98.2 F (36.8 C) (Oral)   Resp 15   Ht 5\' 3"  (1.6 m)   Wt 99.8 kg   SpO2 97%   BMI 38.97 kg/m  Physical Exam CONSTITUTIONAL: Well-appearing, NAD NEURO:  sleeping ENT/NECK:  supple, no JVD CARDIO: Regular rate, well-perfused PULM: No increased work of breathing GI/GU: Nondistended MSK/SPINE:  No gross deformities, no edema SKIN:  no rash, atraumatic PSYCH:  deferred   ED Course / MDM  EKG:EKG Interpretation  Date/Time:  Sunday August 26 2020 18:08:53 EDT Ventricular Rate:  98 PR Interval:    QRS Duration: 96 QT Interval:  367 QTC Calculation: 469 R Axis:   31 Text Interpretation: Sinus rhythm Multiform ventricular premature complexes Borderline prolonged PR interval Anteroseptal infarct, old Nonspecific repol abnormality, diffuse leads No significant change since last tracing Confirmed by [id number] (Gwyneth Sprout) on 08/26/2020 6:22:31 PM    I have reviewed the labs performed to date as well as medications administered while in observation.  Recent changes in the last 24 hours include:  None, will repeat potassium this am, patient is receiving 08/28/2020 bid.  Plan  Current plan is for voluntary inpatient admission. Patient is not under full IVC at this time.   , MD 08/28/20 [phone]

## 2020-08-28 NOTE — Progress Notes (Signed)
Psychoeducational Group Note  Date:  08/28/2020 Time:  2139  Group Topic/Focus:  Wrap-Up Group:   The focus of this group is to help patients review their daily goal of treatment and discuss progress on daily workbooks.  Participation Level: Did Not Attend  Participation Quality:  Not Applicable  Affect:  Not Applicable  Cognitive:  Not Applicable  Insight:  Not Applicable  Engagement in Group: Not Applicable  Additional Comments:  Patient did not attend group since she had just returned from the E.R. and was not feeling well.   Hazle Coca S 08/28/2020, 9:39 PM

## 2020-08-28 NOTE — ED Provider Notes (Signed)
[facility]-EMERGENCY DEPT [facility] Emergency Department Provider Note MRN:  [id number]  Arrival date & time: 08/28/20     Chief Complaint   Fall History of Present Illness   Cynthia [name] is a 59 y.o. year-old female with a history of bipolar disorder presenting to the ED with chief complaint of fall.  Patient sent from behavioral health hospital after fall.  Was recently seen and evaluated in the emergency department and sent to the behavioral health hospital for continued psychiatric care.  Patient arrived to the behavioral health hospital reportedly somnolent, had an unwitnessed fall.  Question of head trauma.  Patient denies neck pain, no chest pain, no shortness of breath, no back pain, no abdominal pain, no numbness or weakness to the arms or legs.  She does not know why she feels tired.  Review of Systems  A complete 10 system review of systems was obtained and all systems are negative except as noted in the HPI and PMH.   Patient's Health History    Past Medical History:  Diagnosis Date  . Bipolar 1 disorder (HCC)   . Essential hypertension   . GERD (gastroesophageal reflux disease)     No past surgical history on file.  Family History  Problem Relation Age of Onset  . Cancer Mother        lung  . Cancer Father        prostate  . Diabetes Paternal Grandfather     Social History   Socioeconomic History  . Marital status: Divorced    Spouse name: Not on file  . Number of children: 3  . Years of education: Not on file  . Highest education level: Not on file  Occupational History  . Not on file  Tobacco Use  . Smoking status: Never Smoker  . Smokeless tobacco: Never Used  Substance and Sexual Activity  . Alcohol use: Not Currently    Comment: rarely  . Drug use: Never  . Sexual activity: Not on file  Other Topics Concern  . Not on file  Social History Narrative  . Not on file   Social Determinants of Health   Financial Resource Strain:   .  Difficulty of Paying Living Expenses: Not on file  Food Insecurity:   . Worried About Programme researcher, broadcasting/film/video in the Last Year: Not on file  . Ran Out of Food in the Last Year: Not on file  Transportation Needs:   . Lack of Transportation (Medical): Not on file  . Lack of Transportation (Non-Medical): Not on file  Physical Activity:   . Days of Exercise per Week: Not on file  . Minutes of Exercise per Session: Not on file  Stress:   . Feeling of Stress : Not on file  Social Connections:   . Frequency of Communication with Friends and Family: Not on file  . Frequency of Social Gatherings with Friends and Family: Not on file  . Attends Religious Services: Not on file  . Active Member of Clubs or Organizations: Not on file  . Attends Banker Meetings: Not on file  . Marital Status: Not on file  Intimate Partner Violence:   . Fear of Current or Ex-Partner: Not on file  . Emotionally Abused: Not on file  . Physically Abused: Not on file  . Sexually Abused: Not on file     Physical Exam   Vitals:   08/28/20 1400 08/28/20 1430  BP: 140/88 94/77  Pulse: (!) 108 94  Resp:  17  Temp:    SpO2: 99% 95%    CONSTITUTIONAL: Well-appearing, NAD NEURO: Somnolent, wakes to voice, oriented x 3, normal and symmetric strength and sensation, normal coordination, normal speech EYES:  eyes equal and reactive ENT/NECK:  no LAD, no JVD CARDIO: Regular rate, well-perfused, normal S1 and S2 PULM:  CTAB no wheezing or rhonchi GI/GU:  normal bowel sounds, non-distended, non-tender MSK/SPINE:  No gross deformities, no edema SKIN:  no rash, atraumatic PSYCH:  Appropriate speech and behavior  *Additional and/or pertinent findings included in MDM below  Diagnostic and Interventional Summary    EKG Interpretation  Date/Time:  Tuesday August 28 2020 14:15:24 EDT Ventricular Rate:  101 PR Interval:    QRS Duration: 91 QT Interval:  348 QTC Calculation: 452 R Axis:   49 Text  Interpretation: Sinus tachycardia Prolonged PR interval Low voltage, precordial leads Anteroseptal infarct, old Nonspecific T abnormalities, lateral leads No significant change was found Confirmed by Kennis Carina [phone]) on 08/28/2020 3:07:27 PM      Labs Reviewed - No data to display  CT Head Wo Contrast    (Results Pending)    Medications - No data to display   Procedures  /  Critical Care Procedures  ED Course and Medical Decision Making  I have reviewed the triage vital signs, the nursing notes, and pertinent available records from the EMR.  Listed above are laboratory and imaging tests that I personally ordered, reviewed, and interpreted and then considered in my medical decision making (see below for details).  Suspect somnolence is related to patient receiving a number of medications this morning at the same time, around 10 AM.  These are her prescribed daily medications.  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  Will obtain screening EKG and CT head here in the emergency department.  Patient has a very reassuring exam other than being mildly somnolent, no focal neurological deficits.  Has had recent laboratory evaluation which is reassuring.  With a normal CT and EKG, she will again be medically cleared and appropriate for return to behavioral health hospital.  Signed out to oncoming provider at shift change.       Elmer Sow. Pilar Plate, MD [facility] Emergency Medicine [facility] [email or web address]  Final Clinical Impressions(s) / ED Diagnoses     ICD-10-CM   1. Fall, initial encounter  W19.XXXA   2. Medication side effect  [email or web address]     ED Discharge Orders    None       Discharge Instructions Discussed with and Provided to Patient:   Discharge Instructions   None       Sabas Sous, MD 08/28/20 [phone]

## 2020-08-28 NOTE — ED Notes (Addendum)
Pt ambulatory out to safe transport. No distress noted. All belongings gathered and verified by patient. Handed over to safe transport.

## 2020-08-28 NOTE — Progress Notes (Signed)
Report called to [place] at [facility]. Pt awaiting Safe transport. Voluntary consent signed by pt.

## 2020-08-28 NOTE — ED Notes (Signed)
Patient just arrived from [facility] after an unwitnessed fall.  Patient found sitting at the end of the bed by this writer.  Looked like she was about to try to get up.  Patient moved back up in the bed and a fall alarm placed under patient for her safety.  Patient lethargic, stating she is always sleepy.  She knows she is in [place] at a hospital but unable to tell me the name of the facility.

## 2020-08-28 NOTE — ED Provider Notes (Signed)
Patient had a head CT which showed no acute abnormality.  She appears to be back to baseline.  She is alert and oriented.  She is able to tell me her name, the month and year and where she is.  She says she is feeling better.  She is able to ambulate without ataxia and without assistance.  She will be transferred back to [facility].  She is medically cleared   Rolan Bucco, MD 08/28/20 [phone]

## 2020-08-28 NOTE — Progress Notes (Addendum)
Pt returned from [facility]. Pt moving slow but without ataxia. Pt states that her hips hurt rating pain 8/10. Pain is chronic. Pt oriented to time, situation, person and place. Pt slow to answer questions. Pt VS assessed. Pt has reddened rash area in center of forehead. Pt states that she is here because her daughter feels she needs to be here. Pt states she feels tired and has for a long time. Pt c/o dizziness. Pt assisted to her room. Gait slow but steady. Pt assisted to the bathroom and gait again observed. No issues noted. Pt offered food and drink and both accepted. Pt cautioned to sit up and assess for dizziness before standing up. Pt also encouraged to call out if help is needed. Pt will be reassessed as needed throughout shift.  Due to fall on previous shift, AC said to begin post fall VS algorithm. VS form initiated. Will continue to monitor.

## 2020-08-29 ENCOUNTER — Encounter (HOSPITAL_COMMUNITY): Payer: Self-pay | Admitting: Psychiatry

## 2020-08-29 DIAGNOSIS — F3132 Bipolar disorder, current episode depressed, moderate: Principal | ICD-10-CM

## 2020-08-29 LAB — LIPID PANEL
Cholesterol: 208 mg/dL — ABNORMAL HIGH (ref 0–200)
HDL: 48 mg/dL (ref >40)
LDL Cholesterol: 131 mg/dL — ABNORMAL HIGH (ref 0–99)
Total CHOL/HDL Ratio: 4.3 RATIO
Triglycerides: 144 mg/dL (ref <150)
VLDL: 29 mg/dL (ref 0–40)

## 2020-08-29 LAB — HEMOGLOBIN A1C
Hgb A1c MFr Bld: 5.6 % (ref 4.8–5.6)
Mean Plasma Glucose: 114.02 mg/dL

## 2020-08-29 LAB — TSH: TSH: 0.258 u[IU]/mL — ABNORMAL LOW (ref 0.350–4.500)

## 2020-08-29 LAB — VALPROIC ACID LEVEL: Valproic Acid Lvl: 41 ug/mL — ABNORMAL LOW (ref 50.0–100.0)

## 2020-08-29 MED ORDER — POTASSIUM CHLORIDE CRYS ER 20 MEQ PO TBCR
20.0000 meq | EXTENDED_RELEASE_TABLET | Freq: Once | ORAL | Status: AC
  Administered 2020-08-29: 20 meq via ORAL
  Filled 2020-08-29: qty 1

## 2020-08-29 MED ORDER — BACITRACIN-POLYMYXIN B OP OINT
TOPICAL_OINTMENT | Freq: Three times a day (TID) | OPHTHALMIC | Status: AC
  Administered 2020-08-30 (×2): 1 via OPHTHALMIC
  Filled 2020-08-29: qty 3.5

## 2020-08-29 MED ORDER — ENSURE ENLIVE PO LIQD
237.0000 mL | Freq: Two times a day (BID) | ORAL | Status: DC
  Administered 2020-08-30 – 2020-09-07 (×14): 237 mL via ORAL

## 2020-08-29 MED ORDER — DIVALPROEX SODIUM 250 MG PO DR TAB
250.0000 mg | DELAYED_RELEASE_TABLET | ORAL | Status: AC
  Administered 2020-08-29: 250 mg via ORAL
  Filled 2020-08-29: qty 1

## 2020-08-29 MED ORDER — BUPROPION HCL 75 MG PO TABS
75.0000 mg | ORAL_TABLET | Freq: Two times a day (BID) | ORAL | Status: DC
  Administered 2020-08-29 – 2020-09-02 (×8): 75 mg via ORAL
  Filled 2020-08-29 (×12): qty 1

## 2020-08-29 MED ORDER — ALBUTEROL SULFATE HFA 108 (90 BASE) MCG/ACT IN AERS: 1.0000 | INHALATION_SPRAY | Freq: Four times a day (QID) | RESPIRATORY_TRACT | Status: DC | PRN

## 2020-08-29 MED ORDER — DIVALPROEX SODIUM 500 MG PO DR TAB
500.0000 mg | DELAYED_RELEASE_TABLET | Freq: Every evening | ORAL | Status: DC
  Administered 2020-08-29 – 2020-09-03 (×6): 500 mg via ORAL
  Filled 2020-08-29 (×7): qty 1

## 2020-08-29 NOTE — Progress Notes (Signed)
Nursing 1:1 note D:Pt placed on 1:1 observation for safety after unwitnessed fall on unit 08/28/20. Pt lethargic and c/o fatigue. Pt observed walking with care to get her lab work.  A: 1:1 observation began for safety.  R: Pt remains safe on unit.

## 2020-08-29 NOTE — H&P (Signed)
Psychiatric Admission Assessment Adult  Patient Identification: Cynthia [name]  MRN:  [id number]  Date of Evaluation:  08/29/2020  Chief Complaint:  Medication side effect [T88.7XXA] Fall, initial encounter [id number][W19.XXXA] MDD (major depressive disorder) [F32.9]  Principal Diagnosis: <principal problem not specified>  Diagnosis:  Active Problems:   MDD (major depressive disorder)  History of Present Illness: (Per Md's SRA notes): Patient is a 59 year old female who originally presented to the [facility] on 08/26/2020 accompanied by her daughter.  The patient is a poor historian and the majority of information that is dictated today comes from old records.  She was reported at that time to be desponded.  The patient has a history of bipolar disorder as well as bipolar disorder; depression.  The chief complaint at that time was "I just do not feel well".  He was awake and alert x3 at that time.  During that evaluation collateral information was obtained from the daughter which reported previous suicide attempts.  They did not have a great deal of information at that time.  The daughter reported the patient resides alone, and that she had done a "welfare check" on the date of admission.  The patient was then transferred to the [facility] for medical clearance.  The patient is followed by a nurse practitioner but again there is very little history from the patient.  They were able to find out that the patient stated that she was not sleeping well, and was having depressive and manic symptoms for the last 6 months.  The daughter reported that she was not able to function was not washing, not eating and not taking care of herself.  During the medical work-up she was found to be hypokalemic, but did not show any evidence of anything that would require an acute medical hospitalization.  This was supplemented and repeated, and her level was at 2.9.  She was  transferred to the psychiatric hospital for evaluation and stabilization.  Unfortunately shortly after she arrived on the hospital grounds she had a fall and was sent back to the emergency department.  At that time her EKG revealed multiple PVCs.  She had a CT scan of the head done that revealed mild cerebral atrophy but no intracranial abnormality.  She was sent back to our hospital for evaluation and stabilization.  We do not have access to the nurse practitioner's notes.  The last note that we have in the chart from the family medicine office was on 08/22/2020.  He has a past medical history significant for hypertension, hyperlipidemia and GERD.  According to the family medicine note she receives Trileptal 300 mg p.o. twice daily which was started 1 week prior to this evaluation.  She had apparently failed Depakote, Abilify as well as Risperdal.  Her other medications at that time included an albuterol inhaler, Lasix, hydrochlorothiazide, Abilify, Depakote, imipramine, meclizine and Risperdal.  It is unclear if she was taking 3 antipsychotics at one time.  At least by the note itself it appears as though all the antipsychotics have been stopped, and her only medication was the Trileptal 300 mg p.o. twice daily.  The patient was seen in [facility] emergency department on 07/03/2019.  She appeared to be manic at that time.  It looks as though she had been transferred to old [facility] for psychiatric care.  We do not have access to those records.  The only notation in the chart with regard to previous medications was an  audiology note from 04/28/2016.  At that time the patient was taking citalopram, Wellbutrin SR and hydrochlorothiazide.  She was admitted to the hospital for evaluation and stabilization.  Review of the PMP database given her multiple complaints of pain revealed no evidence of any benzodiazepines or opiates have not been prescribed to her in that record.  Associated Signs/Symptoms:  Depression  Symptoms:  depressed mood, weight gain,  Duration of Depression Symptoms: Patient is unable to provide this information at this time. She presents as a poor historian.  (Hypo) Manic Symptoms:  Patient at this time presents dysphoric & not manic.  Anxiety Symptoms:  Excessive Worry,  Psychotic Symptoms:  None observed, however, patient currently presents as a poor historian.  Duration of Psychotic Symptoms: NA  PTSD Symptoms: Patient is unable to provide this information at this time.   Total Time spent with patient: 1 hour  Past Psychiatric History: Major depressive disorder, Bipolar disorder: Per chart review.  Is the patient at risk to self? No.  Has the patient been a risk to self in the past 6 months? Unsure, patient is unable to provide this information at this time.  Has the patient been a risk to self within the distant past? Unsure, patient is unable to provide this information at this time.   Is the patient a risk to others?Unsure, patient is unable to provide this information at this time.    Has the patient been a risk to others in the past 6 months?  Unsure, patient is unable to provide this information at this time.  Has the patient been a risk to others within the distant past?  Unsure, patient is unable to provide this information at this time.   Prior Inpatient Therapy: Yes Prior Outpatient Therapy: Yes with Venia Minks.  Alcohol Screening: Patient refused Alcohol Screening Tool: Yes 1. How often do you have a drink containing alcohol?: Never 2. How many drinks containing alcohol do you have on a typical day when you are drinking?: 1 or 2 3. How often do you have six or more drinks on one occasion?: Never AUDIT-C Score: 0 9. Have you or someone else been injured as a result of your drinking?: No 10. Has a relative or friend or a doctor or another health worker been concerned about your drinking or suggested you cut down?: No Alcohol Use Disorder  Identification Test Final Score (AUDIT): 0 Alcohol Brief Interventions/Follow-up: AUDIT Score <7 follow-up not indicated  Substance Abuse History in the last 12 months:  No.  Consequences of Substance Abuse: NA  Previous Psychotropic Medications: Yes  Psychological Evaluations: No   Past Medical History:  Past Medical History:  Diagnosis Date  . Bipolar 1 disorder (HCC)   . Essential hypertension   . GERD (gastroesophageal reflux disease)    History reviewed. No pertinent surgical history.  Family History:  Family History  Problem Relation Age of Onset  . Cancer Mother        lung  . Cancer Father        prostate  . Diabetes Paternal Grandfather    Family Psychiatric  History: Unsure, patient is unable to provide this information at this time.   Tobacco Screening: Have you used any form of tobacco in the last 30 days? (Cigarettes, Smokeless Tobacco, Cigars, and/or Pipes): No  Social History:  Social History   Substance and Sexual Activity  Alcohol Use Not Currently   Comment: rarely     Social History   Substance and  Sexual Activity  Drug Use Never    Additional Social History: Patient has a daughter.  Allergies:  No Known Allergies  Lab Results:  Results for orders placed or performed during the hospital encounter of 08/28/20 (from the past 48 hour(s))  CBG monitoring, ED     Status: None   Collection Time: 08/28/20  3:17 PM  Result Value Ref Range   Glucose-Capillary 86 70 - 99 mg/dL    Comment: Glucose reference range applies only to samples taken after fasting for at least 8 hours.  Hemoglobin A1c     Status: None   Collection Time: 08/29/20  6:58 AM  Result Value Ref Range   Hgb A1c MFr Bld 5.6 4.8 - 5.6 %    Comment: (NOTE) Pre diabetes:          5.7%-6.4%  Diabetes:              >6.4%  Glycemic control for   <7.0% adults with diabetes    Mean Plasma Glucose 114.02 mg/dL    Comment: Performed at [facility], [address]  [place], [place] [address]  Lipid panel     Status: Abnormal   Collection Time: 08/29/20  6:58 AM  Result Value Ref Range   Cholesterol 208 (H) 0 - 200 mg/dL   Triglycerides 169 <678 mg/dL   HDL 48 >93 mg/dL   Total CHOL/HDL Ratio 4.3 RATIO   VLDL 29 0 - 40 mg/dL   LDL Cholesterol 810 (H) 0 - 99 mg/dL    Comment:        Total Cholesterol/HDL:CHD Risk Coronary Heart Disease Risk Table                     Men   Women  1/2 Average Risk   3.4   3.3  Average Risk       5.0   4.4  2 X Average Risk   9.6   7.1  3 X Average Risk  23.4   11.0        Use the calculated Patient Ratio above and the CHD Risk Table to determine the patient's CHD Risk.        ATP III CLASSIFICATION (LDL):  <100     mg/dL   Optimal  175-102  mg/dL   Near or Above                    Optimal  130-159  mg/dL   Borderline  585-277  mg/dL   High  >824     mg/dL   Very High Performed at [facility], [address] [place], [place] [address]   TSH     Status: Abnormal   Collection Time: 08/29/20  6:58 AM  Result Value Ref Range   TSH 0.258 (L) 0.350 - 4.500 uIU/mL    Comment: Performed by a 3rd Generation assay with a functional sensitivity of <=0.01 uIU/mL. Performed at [facility], [address] [place], [place] [address]    Blood Alcohol level:  Lab Results  Component Value Date   ETH <10 08/26/2020   ETH <10 07/03/2019   Metabolic Disorder Labs:  Lab Results  Component Value Date   HGBA1C 5.6 08/29/2020   MPG 114.02 08/29/2020   No results found for: PROLACTIN Lab Results  Component Value Date   CHOL 208 (H) 08/29/2020   TRIG 144 08/29/2020   HDL 48 08/29/2020   CHOLHDL  4.3 08/29/2020   VLDL 29 08/29/2020   LDLCALC 131 (H) 08/29/2020   LDLCALC 151 (H) 08/22/2020   Current Medications: Current Facility-Administered Medications  Medication Dose Route Frequency Provider Last Rate Last Admin  . feeding supplement (ENSURE ENLIVE) (ENSURE ENLIVE) liquid  237 mL  237 mL Oral BID BM Jola Babinski Marlane Mingle, MD       PTA Medications: Medications Prior to Admission  Medication Sig Dispense Refill Last Dose  . albuterol (VENTOLIN HFA) 108 (90 Base) MCG/ACT inhaler Inhale 1-2 puffs into the lungs every 4 (four) hours as needed for wheezing or shortness of breath. (Patient not taking: Reported on 08/27/2020)     . ARIPiprazole (ABILIFY) 10 MG tablet Take 10 mg by mouth every morning.     . divalproex (DEPAKOTE) 500 MG DR tablet Take 500 mg by mouth 2 (two) times daily. (Patient not taking: Reported on 08/27/2020)     . furosemide (LASIX) 20 MG tablet Take 1 tablet by mouth once daily (Patient not taking: Reported on 08/27/2020) 90 tablet 0   . hydrochlorothiazide (HYDRODIURIL) 25 MG tablet Take 1 tablet by mouth once daily 90 tablet 0   . LATUDA 40 MG TABS tablet Take 40 mg by mouth at bedtime. (Patient not taking: Reported on 08/27/2020)     . Oxcarbazepine (TRILEPTAL) 300 MG tablet Take 300 mg by mouth 2 (two) times daily.     . potassium chloride SA (KLOR-CON) 20 MEQ tablet Take 1 tablet (20 mEq total) by mouth 2 (two) times daily for 7 days. 14 tablet 0   . QUEtiapine (SEROQUEL XR) 400 MG 24 hr tablet Take 400 mg by mouth at bedtime. (Patient not taking: Reported on 08/27/2020)     . risperiDONE (RISPERDAL) 3 MG tablet Take 6 mg by mouth at bedtime. (Patient not taking: Reported on 08/27/2020)      Musculoskeletal: Strength & Muscle Tone: within normal limits Gait & Station: normal Patient leans: N/A  Psychiatric Specialty Exam: Physical Exam HENT:     Head: Normocephalic.     Nose: Nose normal.     Mouth/Throat:     Pharynx: Oropharynx is clear.  Eyes:     Pupils: Pupils are equal, round, and reactive to light.  Cardiovascular:     Rate and Rhythm: Normal rate.     Pulses: Normal pulses.     Comments: Bilateral pedal Edema. Pulmonary:     Effort: Pulmonary effort is normal.  Genitourinary:    Comments: Deferred Musculoskeletal:         General: Normal range of motion.     Cervical back: Normal range of motion.  Skin:    General: Skin is warm and dry.     Comments: Bilateral pedal edema  Neurological:     General: No focal deficit present.     Mental Status: She is alert and oriented to person, place, and time.     Review of Systems  Constitutional: Negative for chills, diaphoresis and fever.  HENT: Negative for congestion, rhinorrhea, sneezing and sore throat.   Eyes: Negative for discharge.  Respiratory: Negative for cough, chest tightness, shortness of breath and wheezing.   Cardiovascular: Negative for chest pain and palpitations.  Gastrointestinal: Negative for diarrhea, nausea and vomiting.  Endocrine: Negative for cold intolerance.  Genitourinary: Negative for difficulty urinating.  Musculoskeletal: Positive for back pain, gait problem and myalgias.  Skin: Negative.   Allergic/Immunologic: Negative for environmental allergies and food allergies.       Allergies: NKDA  Neurological: Negative for dizziness, tremors, seizures, syncope, facial asymmetry, speech difficulty, weakness, light-headedness, numbness and headaches.  Psychiatric/Behavioral: Positive for decreased concentration and dysphoric mood. Negative for agitation, behavioral problems, confusion, hallucinations, self-injury, sleep disturbance and suicidal ideas. The patient is nervous/anxious. The patient is not hyperactive (Resteless, fidgety).     Blood pressure 115/66, pulse 97, temperature 98.1 F (36.7 C), temperature source Oral, resp. rate 16, height 5\' 3"  (1.6 m), weight 99.8 kg, SpO2 99 %.Body mass index is 38.97 kg/m.  General Appearance: Disheveled  Eye Contact:  Poor  Speech:  Slow  Volume:  Decreased  Mood:  Dysphoric  Affect:  Constricted  Thought Process:  Coherent and Descriptions of Associations: Tangential  Orientation:  Other:  Oriented to name only  Thought Content:  Tangential  Suicidal Thoughts:  Unsure, patient is unable  to provide this information at this time.   Homicidal Thoughts:  Unsure, patient is unable to provide this information at this time.   Memory:  Immediate;   Fair Recent;   Poor Remote;   Poor  Judgement:  Impaired  Insight:  Fair  Psychomotor Activity:  Restlessness  Concentration:  Poor  Recall:  Poor  Fund of Knowledge:  Poor  Language:  Poor  Akathisia:  Yes, there is a feeling of uneasiness, unrest, restless & fidgety  Handed:  Right  AIMS (if indicated):     Assets:  Desire for Improvement Resilience  ADL's:  Impaired  Cognition:  Impaired,  Moderate  Sleep:  Number of Hours: 5.75   Treatment Plan Summary: Daily contact with patient to assess and evaluate symptoms and progress in treatment and Medication management.  Treatment Plan/Recommendations:  1. Admit for crisis management and stabilization, estimated length of stay 3-5 days.    2. Medication management to reduce current symptoms to base line and improve the patient's overall level of functioning: See MAR, Md's SRA & treatment plan.   Observation Level/Precautions:  15 minute checks  Laboratory:  Per ED  Psychotherapy: Group sessions  Medications: See [facility]  Consultations: As needed   Discharge Concerns: Safety, mood stability   Estimated LOS: 3-5 days  Other: Admit to the 300-hall.    Physician Treatment Plan for Primary Diagnosis: <principal problem not specified>  Long Term Goal(s): Improvement in symptoms so as ready for discharge  Short Term Goals: Ability to identify changes in lifestyle to reduce recurrence of condition will improve, Ability to verbalize feelings will improve and Ability to identify and develop effective coping behaviors will improve  Physician Treatment Plan for Secondary Diagnosis: Active Problems:   MDD (major depressive disorder)  Long Term Goal(s): Improvement in symptoms so as ready for discharge  Short Term Goals: Ability to maintain clinical measurements within normal limits  will improve and Compliance with prescribed medications will improve  I certify that inpatient services furnished can reasonably be expected to improve the patient's condition.    [facility], NP, PMHNP, FNP-BC 9/22/202110:48 AM

## 2020-08-29 NOTE — Tx Team (Signed)
Interdisciplinary Treatment and Diagnostic Plan Update  08/29/2020 Time of Session: 1022 WINNA GOLLA MRN: [id number]  Principal Diagnosis: <principal problem not specified>  Secondary Diagnoses: Active Problems:   MDD (major depressive disorder)   Current Medications:  Current Facility-Administered Medications  Medication Dose Route Frequency Provider Last Rate Last Admin   albuterol (VENTOLIN HFA) 108 (90 Base) MCG/ACT inhaler 1-2 puff  1-2 puff Inhalation Q6H PRN Sharma Covert, MD       buPROPion [facility]) tablet 75 mg  75 mg Oral BID Sharma Covert, MD       feeding supplement (ENSURE ENLIVE) (ENSURE ENLIVE) liquid 237 mL  237 mL Oral BID BM Sharma Covert, MD       potassium chloride SA (KLOR-CON) CR tablet 20 mEq  20 mEq Oral Once Sharma Covert, MD       PTA Medications: Medications Prior to Admission  Medication Sig Dispense Refill Last Dose   albuterol (VENTOLIN HFA) 108 (90 Base) MCG/ACT inhaler Inhale 1-2 puffs into the lungs every 4 (four) hours as needed for wheezing or shortness of breath. (Patient not taking: Reported on 08/27/2020)      ARIPiprazole (ABILIFY) 10 MG tablet Take 10 mg by mouth every morning.      divalproex (DEPAKOTE) 500 MG DR tablet Take 500 mg by mouth 2 (two) times daily. (Patient not taking: Reported on 08/27/2020)      furosemide (LASIX) 20 MG tablet Take 1 tablet by mouth once daily (Patient not taking: Reported on 08/27/2020) 90 tablet 0    hydrochlorothiazide (HYDRODIURIL) 25 MG tablet Take 1 tablet by mouth once daily 90 tablet 0    LATUDA 40 MG TABS tablet Take 40 mg by mouth at bedtime. (Patient not taking: Reported on 08/27/2020)      Oxcarbazepine (TRILEPTAL) 300 MG tablet Take 300 mg by mouth 2 (two) times daily.      potassium chloride SA (KLOR-CON) 20 MEQ tablet Take 1 tablet (20 mEq total) by mouth 2 (two) times daily for 7 days. 14 tablet 0    QUEtiapine (SEROQUEL XR) 400 MG 24 hr tablet Take 400 mg by  mouth at bedtime. (Patient not taking: Reported on 08/27/2020)      risperiDONE (RISPERDAL) 3 MG tablet Take 6 mg by mouth at bedtime. (Patient not taking: Reported on 08/27/2020)       Patient Stressors:    Patient Strengths:    Treatment Modalities: Medication Management, Group therapy, Case management,  1 to 1 session with clinician, Psychoeducation, Recreational therapy.   Physician Treatment Plan for Primary Diagnosis: <principal problem not specified> Long Term Goal(s):     Short Term Goals:    Medication Management: Evaluate patient's response, side effects, and tolerance of medication regimen.  Therapeutic Interventions: 1 to 1 sessions, Unit Group sessions and Medication administration.  Evaluation of Outcomes: Not Met  Physician Treatment Plan for Secondary Diagnosis: Active Problems:   MDD (major depressive disorder)  Long Term Goal(s):     Short Term Goals:       Medication Management: Evaluate patient's response, side effects, and tolerance of medication regimen.  Therapeutic Interventions: 1 to 1 sessions, Unit Group sessions and Medication administration.  Evaluation of Outcomes: Not Met   RN Treatment Plan for Primary Diagnosis: <principal problem not specified> Long Term Goal(s): Knowledge of disease and therapeutic regimen to maintain health will improve  Short Term Goals: Ability to demonstrate self-control, Ability to verbalize feelings will improve, Ability to identify and develop effective  coping behaviors will improve and Compliance with prescribed medications will improve  Medication Management: RN will administer medications as ordered by provider, will assess and evaluate patient's response and provide education to patient for prescribed medication. RN will report any adverse and/or side effects to prescribing provider.  Therapeutic Interventions: 1 on 1 counseling sessions, Psychoeducation, Medication administration, Evaluate responses to  treatment, Monitor vital signs and CBGs as ordered, Perform/monitor CIWA, COWS, AIMS and Fall Risk screenings as ordered, Perform wound care treatments as ordered.  Evaluation of Outcomes: Not Met   LCSW Treatment Plan for Primary Diagnosis: <principal problem not specified> Long Term Goal(s): Safe transition to appropriate next level of care at discharge, Engage patient in therapeutic group addressing interpersonal concerns.  Short Term Goals: Engage patient in aftercare planning with referrals and resources, Facilitate acceptance of mental health diagnosis and concerns and Increase skills for wellness and recovery  Therapeutic Interventions: Assess for all discharge needs, 1 to 1 time with Social worker, Explore available resources and support systems, Assess for adequacy in community support network, Educate family and significant other(s) on suicide prevention, Complete Psychosocial Assessment, Interpersonal group therapy.  Evaluation of Outcomes: Not Met   Progress in Treatment: Attending groups: No. Participating in groups: No. Taking medication as prescribed: Yes. Toleration medication: Yes. Family/Significant other contact made: No, will contact:  SW will assess with Pt Patient understands diagnosis: No. Discussing patient identified problems/goals with staff: Yes. Medical problems stabilized or resolved: No. Denies suicidal/homicidal ideation: Yes. Issues/concerns per patient self-inventory: No. Other: None  New problem(s) identified: No, Describe:  None  New Short Term/Long Term Goal(s):  Patient Goals:  "To get to feeling better"  Discharge Plan or Barriers: SW will continue to assess.   Reason for Continuation of Hospitalization: Depression Medication stabilization  Estimated Length of Stay: 3-5 Days  Attendees: Patient: Cynthia [name] 08/29/2020 11:06 AM  Physician: Dr. Myles Lipps 08/29/2020 11:06 AM  Nursing:  08/29/2020 11:06 AM  RN Care Manager: 08/29/2020  11:06 AM  Social Worker: Freddi Che, LCSW 08/29/2020 11:06 AM  Recreational Therapist:  08/29/2020 11:06 AM  Other:  08/29/2020 11:06 AM  Other:  08/29/2020 11:06 AM  Other: 08/29/2020 11:06 AM    Scribe for Treatment Team: Freddi Che, LCSW 08/29/2020 11:06 AM

## 2020-08-29 NOTE — Progress Notes (Signed)
NUTRITION ASSESSMENT  Pt identified as at risk on the Malnutrition Screen Tool  INTERVENTION: 1. Supplements: Ensure Enlive po BID, each supplement provides 350 kcal and 20 grams of protein   NUTRITION DIAGNOSIS: Unintentional weight loss related to sub-optimal intake as evidenced by pt report.   Goal: Pt to meet >/= 90% of their estimated nutrition needs.  Monitor:  PO intake  Assessment:  Pt admitted with depression and SI. Pt was evaluated in the ED after falling 9/21. Now returned to [facility]. Per staff notes, pt lethargic and fatigued. Pt reported poor appetite and 8 lbs of weight loss. Per weight records, pt has lost 22 lbs since 02/20/20 (9% wt loss x 6 months, insignificant for time frame).  Will order Ensure supplements.   Height: Ht Readings from Last 1 Encounters:  08/28/20 5\' 3"  (1.6 m)    Weight: Wt Readings from Last 1 Encounters:  08/28/20 99.8 kg    Weight Hx: Wt Readings from Last 10 Encounters:  08/28/20 99.8 kg  08/26/20 99.8 kg  08/26/20 99.8 kg  08/22/20 94.8 kg  02/20/20 110.2 kg  07/03/19 107.7 kg    BMI:  Body mass index is 38.97 kg/m. Pt meets criteria for obesity based on current BMI.  Estimated Nutritional Needs: Kcal: 25-30 kcal/kg Protein: > 1 gram protein/kg Fluid: 1 ml/kcal  Diet Order:  Diet Order            Diet regular Room service appropriate? No; Fluid consistency: Thin; Fluid restriction: 2000 mL Fluid  Diet effective now                Pt is also offered choice of unit snacks mid-morning and mid-afternoon.  Pt is eating as desired.   Lab results and medications reviewed.   07/05/19, MS, RD, LDN Inpatient Clinical Dietitian Contact information available via Amion

## 2020-08-29 NOTE — [facility] Suicide Risk Assessment (Signed)
[facility] Admission Suicide Risk Assessment   Nursing information obtained from:    Demographic factors:    Current Mental Status:    Loss Factors:    Historical Factors:    Risk Reduction Factors:     Total Time spent with patient: 45 minutes Principal Problem: <principal problem not specified> Diagnosis:  Active Problems:   MDD (major depressive disorder)  Subjective Data: Patient is seen and examined.  Patient is a 59 year old female who originally presented to the [facility] on 08/26/2020 accompanied by her daughter.  The patient is a poor historian and the majority of information that is dictated today comes from old records.  She was reported at that time to be desponded.  The patient has a history of bipolar disorder as well as bipolar disorder; depression.  The chief complaint at that time was "I just do not feel well".  He was awake and alert x3 at that time.  During that evaluation collateral information was obtained from the daughter which reported previous suicide attempts.  They did not have a great deal of information at that time.  The daughter reported the patient resides alone, and that she had done a "welfare check" on the date of admission.  The patient was then transferred to the [facility] for medical clearance.  The patient is followed by a nurse practitioner but again there is very little history from the patient.  They were able to find out that the patient stated that she was not sleeping well, and was having depressive and manic symptoms for the last 6 months.  The daughter reported that she was not able to function was not washing, not eating and not taking care of herself.  During the medical work-up she was found to be hypokalemic, but did not show any evidence of anything that would require an acute medical hospitalization.  This was supplemented and repeated, and her level was at 2.9.  She was transferred to the psychiatric  hospital for evaluation and stabilization.  Unfortunately shortly after she arrived on the hospital grounds she had a fall and was sent back to the emergency department.  At that time her EKG revealed multiple PVCs.  She had a CT scan of the head done that revealed mild cerebral atrophy but no intracranial abnormality.  She was sent back to our hospital for evaluation and stabilization.  We do not have access to the nurse practitioner's notes.  The last note that we have in the chart from the family medicine office was on 08/22/2020.  He has a past medical history significant for hypertension, hyperlipidemia and GERD.  According to the family medicine note she receives Trileptal 300 mg p.o. twice daily which was started 1 week prior to this evaluation.  She had apparently failed Depakote, Abilify as well as Risperdal.  Her other medications at that time included an albuterol inhaler, Lasix, hydrochlorothiazide, Abilify, Depakote, imipramine, meclizine and Risperdal.  It is unclear if she was taking 3 antipsychotics at one time.  At least by the note itself it appears as though all the antipsychotics have been stopped, and her only medication was the Trileptal 300 mg p.o. twice daily.  The patient was seen in the [facility] emergency department on 07/03/2019.  She appeared to be manic at that time.  It looks as though she had been transferred to old [place] for psychiatric care.  We do not have access to those records.  The only notation  in the chart with regard to previous medications was an audiology note from 04/28/2016.  At that time the patient was taking citalopram, Wellbutrin SR and hydrochlorothiazide.  She was admitted to the hospital for evaluation and stabilization.  Review of the PMP database given her multiple complaints of pain revealed no evidence of any benzodiazepines or opiates have not been prescribed to her in that record.  Continued Clinical Symptoms:  Alcohol Use Disorder Identification Test  Final Score (AUDIT): 0 The "Alcohol Use Disorders Identification Test", Guidelines for Use in Primary Care, Second Edition.  World Science writer [facility]). Score between 0-7:  no or low risk or alcohol related problems. Score between 8-15:  moderate risk of alcohol related problems. Score between 16-19:  high risk of alcohol related problems. Score 20 or above:  warrants further diagnostic evaluation for alcohol dependence and treatment.   CLINICAL FACTORS:   Bipolar Disorder:   Depressive phase   Musculoskeletal: Strength & Muscle Tone: decreased Gait & Station: unsteady Patient leans: N/A  Psychiatric Specialty Exam: Physical Exam Vitals and nursing note reviewed.  Constitutional:      Appearance: She is obese.  HENT:     Head: Normocephalic.  Pulmonary:     Effort: Pulmonary effort is normal.  Neurological:     General: No focal deficit present.     Review of Systems  Blood pressure 115/66, pulse 97, temperature 98.1 F (36.7 C), temperature source Oral, resp. rate 16, height 5\' 3"  (1.6 m), weight 99.8 kg, SpO2 99 %.Body mass index is 38.97 kg/m.  General Appearance: Disheveled  Eye Contact:  Minimal  Speech:  Slow  Volume:  Decreased  Mood:  Depressed and Dysphoric  Affect:  Flat  Thought Process:  Disorganized and Descriptions of Associations: Circumstantial  Orientation:  Negative  Thought Content:  Negative  Suicidal Thoughts:  No  Homicidal Thoughts:  No  Memory:  Immediate;   Poor Recent;   Poor Remote;   Poor  Judgement:  Impaired  Insight:  Fair  Psychomotor Activity:  Increased  Concentration:  Concentration: Poor and Attention Span: Poor  Recall:  Poor  Fund of Knowledge:  Poor  Language:  Poor  Akathisia:  Negative  Handed:  Right  AIMS (if indicated):     Assets:  Desire for Improvement Resilience  ADL's:  Impaired  Cognition:  Impaired,  Moderate  Sleep:  Number of Hours: 5.75      COGNITIVE FEATURES THAT CONTRIBUTE TO RISK:  None     SUICIDE RISK:   Moderate:  Frequent suicidal ideation with limited intensity, and duration, some specificity in terms of plans, no associated intent, good self-control, limited dysphoria/symptomatology, some risk factors present, and identifiable protective factors, including available and accessible social support.  PLAN OF CARE: Patient is seen and examined.  Patient is a 59 year old female with the above-stated past psychiatric history who is admitted secondary to worsening depression.  She will be admitted to the hospital.  She will be integrated in the milieu.  She will be encouraged to attend groups.  Given her fall history and unstable gait we will have to put her on one-to-one.  Once her mental status improves we will get a physical therapy consult.  We need more information from old records, and I will asked social work to contact old 61 as well as her nurse practitioner to get better records with regard to previous psychiatric hospitalizations and treatment.  Unfortunately on admission they did not write any of her medications including  blood pressure medications.  It did appear as though she had been previously written to diuretics, and that is what most likely led to her hypokalemia.  That may also be affecting her mentation.  Currently her blood pressure is stable.  Is 115/66.  Her pulse was 97.  Pulse oximetry on room air this morning was 99%.  She is afebrile.  She is diabetic by report, and her blood sugar this morning is 86.  Of her electrolytes on 9/21 her potassium was 3.8.  Blood sugar was 116, creatinine was 0.75.  Her alkaline phosphatase was 84, liver function enzymes were normal.  Her lipid panel from 9/22 showed an elevated cholesterol at 208, and LDL of 131, and triglycerides 144.  Her CBC was normal except for a mild elevation of her platelets at 436,000.  Her differential appear to be essentially normal.  Acetaminophen was less than 10, salicylate was less than 7.  Hemoglobin  A1c was 5.6.  TSH was 0.  258.  Blood alcohol was less than 10, salicylate less than 7, will acetaminophen less than 10.  Urine drug screen was completely negative.  Her EKG was abnormal, but appears to be sinus tachycardia.  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  QTC was approximately 450.  In comparison to an EKG from 2020 it did appear as though that was essentially unchanged.  We will hold her diuretics at this point.  We will supplement her potassium.  Since it looks like the Trileptal was started recently and with her deterioration we will stop that.  She has apparently done fairly well with Depakote in the past and her CBC and liver function enzymes are normal.  We will go on and start Depakote 500 mg p.o. every afternoon for mood stability.  It is unclear in the past why the Celexa or Wellbutrin was stopped.  I suspect she probably had a manic episode and that is what led to her being stopped.  Wellbutrin has the lowest risk of flipping bipolar patients into a manic phase.  We will cautiously start short acting Wellbutrin 75 mg p.o. twice daily and monitor for response.  Also on the table in this discussion has to be ECT given the severity of her illness at this point and how she does with p.o. intake etc.  We will discussed with her daughter the possibility of ECT, and if she is in agreement with that we will consult with Dr. Toni Amend at [facility].  I certify that inpatient services furnished can reasonably be expected to improve the patient's condition.   Antonieta Pert, MD 08/29/2020, 10:43 AM

## 2020-08-29 NOTE — Progress Notes (Signed)
   08/29/20 2050  COVID-19 Daily Checkoff  Have you had a fever (temp > 37.80C/100F)  in the past 24 hours?  No  COVID-19 EXPOSURE  Have you traveled outside the state in the past 14 days? No  Have you been in contact with someone with a confirmed diagnosis of COVID-19 or PUI in the past 14 days without wearing appropriate PPE? No  Have you been living in the same home as a person with confirmed diagnosis of COVID-19 or a PUI (household contact)? No  Have you been diagnosed with COVID-19? No

## 2020-08-29 NOTE — Progress Notes (Signed)
Psychoeducational Group Note  Date:  08/29/2020 Time:  2114  Group Topic/Focus:  Wrap-Up Group:   The focus of this group is to help patients review their daily goal of treatment and discuss progress on daily workbooks.  Participation Level: Did Not Attend  Participation Quality:  Not Applicable  Affect:  Not Applicable  Cognitive:  Not Applicable  Insight:  Not Applicable  Engagement in Group: Not Applicable  Additional Comments:  The patient did not attend group this evening since she was asleep.   Hazle Coca S 08/29/2020, 9:14 PM

## 2020-08-30 LAB — SPECIMEN STATUS REPORT

## 2020-08-30 LAB — GLUCOSE, CAPILLARY
Glucose-Capillary: 99 mg/dL (ref 70–99)
Glucose-Capillary: 113 mg/dL — ABNORMAL HIGH (ref 70–99)
Glucose-Capillary: 119 mg/dL — ABNORMAL HIGH (ref 70–99)
Glucose-Capillary: 109 mg/dL — ABNORMAL HIGH (ref 70–99)

## 2020-08-30 LAB — HGB A1C W/O EAG: Hgb A1c MFr Bld: 5.5 % (ref 4.8–5.6)

## 2020-08-30 MED ORDER — MELATONIN 3 MG PO TABS
3.0000 mg | ORAL_TABLET | Freq: Every day | ORAL | Status: DC
  Administered 2020-08-30 – 2020-09-06 (×8): 3 mg via ORAL
  Filled 2020-08-30 (×9): qty 1

## 2020-08-30 MED ORDER — IBUPROFEN 600 MG PO TABS
600.0000 mg | ORAL_TABLET | Freq: Four times a day (QID) | ORAL | Status: DC | PRN
  Administered 2020-08-31: 600 mg via ORAL
  Filled 2020-08-30 (×2): qty 1

## 2020-08-30 NOTE — Progress Notes (Signed)
Patient rated her day as a 6 out of 10. She feels as if she is improving and is feeling better. Her goal for tomorrow is to continue to improve.

## 2020-08-30 NOTE — Progress Notes (Signed)
1:1 Note 1000  Patient received call this morning.  Patient walked to phone in hallway using walker.  Patient has not felt good this morning.  [place] given patient, cool cloth for forehead.  1:1 continues for safety per MD order.

## 2020-08-30 NOTE — Progress Notes (Signed)
1:1 Note 1600  Patient continues with 1:1 for safety.  Patient did attend group this afternoon.  Continues to use walker for safety.  Stated she does feel alittle better.  Respirations even and unlabored.  No signs/symptoms of pain/distress noted on patient's face/body movements.  Safety maintained with 1:1 for safety.

## 2020-08-30 NOTE — Progress Notes (Signed)
1:1 Note 1800   Patient continues with 1:1 for safety.  Patient ate small amount of dinner tonight, approximately 10%.  Patient denied SI and HI, contracts for safety.  Denied A/V hallucinations.  Patient has walked in the hallway several times this afternoon.  Respirations even and unlabored.  No signs/symptoms of pain/distress noted on patient's face/body movements.  1:1 continues for safety per MD order.

## 2020-08-30 NOTE — Progress Notes (Signed)
1:1 Note 1500  Patient talked on phone, walked in hallway several times, laying in bed from time to time.  Patient stated she is feeling better now, comes and goes, does not understand why.  Respirations even and unlabored.  No signs/symptoms of pain/distress noted on patient's face/body movements.  1:1 continues for ptitent's safety.

## 2020-08-30 NOTE — Plan of Care (Signed)
Nurse discussed safety issues with patient.  Discussed anxiety, depression and coping skills with patient also.

## 2020-08-30 NOTE — Progress Notes (Signed)
Pt continues on a 1:1 sitter for safety due to a fall on 08/28/2020. Pt reports having an "okay" day and that her goal is to "go home." Pt said that she has been "sleeping to much" and that her appetite has been "not too good." Pt has been isolative to her room tonight, resting in bed. Pt has been able to respond to questions appropriately, but may be slow at times. Pt's gait is slow and unsteady. MHT remains at bedside for assistance as needed with ADLs. Pt did c/o of dizziness when ambulating. Reinforced education to wear yellow non-skid socks when ambulating, dangle on the side of the bed for 1-2 minutes before ambulating, and changing positions slowly. Pt demonstrated understanding. Pt denies SI/HI and AVH. Active listening, reassurance, and support provided. Encouraged to be more active in milieu and attend groups as tolerated. Q 15 min safety checks continue. Pt's safety has been maintained.   08/29/20 2050  Psych Admission Type (Psych Patients Only)  Admission Status Voluntary  Psychosocial Assessment  Patient Complaints Depression;Sadness  Eye Contact Poor  Facial Expression Flat  Affect Blunted;Flat  Speech Soft;Logical/coherent;Slow  Interaction Minimal;Forwards little;Isolative  Motor Activity Slow;Unsteady (1:1 sitter for safety)  Appearance/Hygiene Disheveled  Behavior Characteristics Cooperative;Calm  Mood Depressed;Sad  Thought Process  Coherency WDL  Content WDL  Delusions None reported or observed  Perception WDL  Hallucination None reported or observed  Judgment Poor  Confusion Mild  Danger to Self  Current suicidal ideation? Denies  Danger to Others  Danger to Others None reported or observed

## 2020-08-30 NOTE — Progress Notes (Signed)
[facility]  08/30/2020 1:24 PM Sanjuan DameKimberly H Lockner  MRN:  [id number]  Subjective: Cynthia [name] reports, "I'm not doing good. I did not sleep a wink last night because of weird dream. I can't remember the dreams now. Can I go home? I don't feel comfortable here & I don't like the food here".  Objective: Patient is a 59 year old female who [facility]/2020 accompanied by her daughter. The patient is a poor historian and the majority of information that is dictated today comes from old records. She was reported at that time to be desponded. The patient has a history of bipolar disorder as well as bipolar disorder; depression. The chief complaint at that time was "I just do not feel well". He was awake and alert x3 at that time. During that evaluation collateral information was obtained from the daughter which reported previous suicide attempts. They did not have a great deal of information at that time. The daughter reported the patient resides alone, and that she had done a "welfare check" on the date of admission. The patient [facility] then [facility]. The patient is followed by a nurse practitioner but again there is very little history from the patient. Cynthia [name] is seen, chart reviewed. The chart findings discussed with the treatment team. She presents alert, sluggish, presenting & acting sickly. Her vital signs are stable. She remains on 1:1 supervision for safety. She is using a walker to support her mobility/balance. She is verbally responsive, but not making much of an eye contacts. Cynthia [name] says she is not doing well today. She is unable to pin-point what exactly is wrong with her or why she is feeling so bad physically. She says she has been feeling depressed for a long time. Does not know the reasons for her depression. Says there are no mental illnesses in her family.  Cynthia [name] says she has been on a lot of mental health medications in her life time although she is currently unable to remember their names. Says she did not & has benefited from any psychotropic medications over time. She also did not think that we will be able to help her get better [facility]. She is complaining of insomnia. Per documentation, she slept for about 5.5 hours last night. She spends her day lying down & sleeping. That could be the reasons for the poor sleep at night. She reports fair appetite, does not like out [facility]. She currently denies any SIHI, AVH, delusional thoughts or paranoia. She does not appear to be responding to any internal stimuli. Cynthia [name] is on ensure in between meals bid. She is currently not attending group sessions. Will add melatonin 3 mg po Q hs to aid her sleep. She says she is unable to remember the weird dreams she had last night. Denies they were nightmares.  Principal Problem: Bipolar 1 disorder, depressed, moderate (HCC)  Diagnosis: Principal Problem:   Bipolar 1 disorder, depressed, moderate (HCC) Active Problems:   MDD (major depressive disorder)  Total Time spent with patient: 30 minutes  Past Psychiatric History: Bipolar 1 disorder, depressed.  Past Medical History:  Past Medical History:  Diagnosis Date  . Bipolar 1 disorder (HCC)   . Essential hypertension   . GERD (gastroesophageal reflux disease)    History reviewed. No pertinent surgical history.  Family History:  Family History  Problem Relation Age of Onset  . Cancer Mother  lung  . Cancer Father        prostate  . Diabetes Paternal Grandfather    Family Psychiatric  History: See H&P.  Social History:  Social History   Substance and Sexual Activity  Alcohol Use Not Currently   Comment: rarely     Social History   Substance and Sexual Activity  Drug Use Never    Social History   Socioeconomic History  . Marital status: Divorced    Spouse name: Not  on file  . Number of children: 3  . Years of education: Not on file  . Highest education level: Not on file  Occupational History  . Not on file  Tobacco Use  . Smoking status: Never Smoker  . Smokeless tobacco: Never Used  Substance and Sexual Activity  . Alcohol use: Not Currently    Comment: rarely  . Drug use: Never  . Sexual activity: Not on file  Other Topics Concern  . Not on file  Social History Narrative  . Not on file   Social Determinants of Health   Financial Resource Strain:   . Difficulty of Paying Living Expenses: Not on file  Food Insecurity:   . Worried About Programme researcher, broadcasting/film/video in the Last Year: Not on file  . Ran Out of Food in the Last Year: Not on file  Transportation Needs:   . Lack of Transportation (Medical): Not on file  . Lack of Transportation (Non-Medical): Not on file  Physical Activity:   . Days of Exercise per Week: Not on file  . Minutes of Exercise per Session: Not on file  Stress:   . Feeling of Stress : Not on file  Social Connections:   . Frequency of Communication with Friends and Family: Not on file  . Frequency of Social Gatherings with Friends and Family: Not on file  . Attends Religious Services: Not on file  . Active Member of Clubs or Organizations: Not on file  . Attends Banker Meetings: Not on file  . Marital Status: Not on file   Additional Social History:   Sleep: Fair  Appetite:  Fair  Current Medications: Current Facility-Administered Medications  Medication Dose Route Frequency Provider Last Rate Last Admin  . albuterol (VENTOLIN HFA) 108 (90 Base) MCG/ACT inhaler 1-2 puff  1-2 puff Inhalation Q6H PRN Antonieta Pert, MD      . bacitracin-polymyxin b (ophth) (POLYSPORIN) ophthalmic ointment   Both Eyes TID Armandina Stammer I, NP   1 application at 08/30/20 1212  . buPROPion [facility] tablet 75 mg  75 mg Oral BID Antonieta Pert, MD   75 mg at 08/30/20 0738  . divalproex (DEPAKOTE) DR tablet 500  mg  500 mg Oral QPM Antonieta Pert, MD   500 mg at 08/29/20 1737  . feeding supplement (ENSURE ENLIVE) (ENSURE ENLIVE) liquid 237 mL  237 mL Oral BID BM Antonieta Pert, MD   237 mL at 08/30/20 1100    Lab Results:  Results for orders placed or performed during the hospital encounter of 08/28/20 (from the past 48 hour(s))  CBG monitoring, ED     Status: None   Collection Time: 08/28/20  3:17 PM  Result Value Ref Range   Glucose-Capillary 86 70 - 99 mg/dL    Comment: Glucose reference range applies only to samples taken after fasting for at least 8 hours.  Hemoglobin A1c     Status: None   Collection Time: 08/29/20  6:58 AM  Result Value Ref Range   Hgb A1c MFr Bld 5.6 4.8 - 5.6 %    Comment: (NOTE) Pre diabetes:          5.7%-6.4%  Diabetes:              >6.4%  Glycemic control for   <7.0% adults with diabetes    Mean Plasma Glucose 114.02 mg/dL    Comment: Performed at [facility], [address] [place], [place] [address]  Lipid panel     Status: Abnormal   Collection Time: 08/29/20  6:58 AM  Result Value Ref Range   Cholesterol 208 (H) 0 - 200 mg/dL   Triglycerides 841 <324 mg/dL   HDL 48 >40 mg/dL   Total CHOL/HDL Ratio 4.3 RATIO   VLDL 29 0 - 40 mg/dL   LDL Cholesterol 102 (H) 0 - 99 mg/dL    Comment:        Total Cholesterol/HDL:CHD Risk Coronary Heart Disease Risk Table                     Men   Women  1/2 Average Risk   3.4   3.3  Average Risk       5.0   4.4  2 X Average Risk   9.6   7.1  3 X Average Risk  23.4   11.0        Use the calculated Patient Ratio above and the CHD Risk Table to determine the patient's CHD Risk.        ATP III CLASSIFICATION (LDL):  <100     mg/dL   Optimal  725-366  mg/dL   Near or Above                    Optimal  130-159  mg/dL   Borderline  440-347  mg/dL   High  >425     mg/dL   Very High Performed at [facility], [address] [place], [place] [address]   TSH     Status: Abnormal    Collection Time: 08/29/20  6:58 AM  Result Value Ref Range   TSH 0.258 (L) 0.350 - 4.500 uIU/mL    Comment: Performed by a 3rd Generation assay with a functional sensitivity of <=0.01 uIU/mL. Performed at [facility], [address], [place], [place] [address]   Valproic acid level     Status: Abnormal   Collection Time: 08/29/20  5:54 PM  Result Value Ref Range   Valproic Acid Lvl 41 (L) 50.0 - 100.0 ug/mL    Comment: Performed at [facility], [address] [place], [place] [address]  Glucose, capillary     Status: None   Collection Time: 08/30/20  6:00 AM  Result Value Ref Range   Glucose-Capillary 99 70 - 99 mg/dL    Comment: Glucose reference range applies only to samples taken after fasting for at least 8 hours.  Glucose, capillary     Status: Abnormal   Collection Time: 08/30/20 11:44 AM  Result Value Ref Range   Glucose-Capillary 113 (H) 70 - 99 mg/dL    Comment: Glucose reference range applies only to samples taken after fasting for at least 8 hours.   Blood Alcohol level:  Lab Results  Component Value Date   [facility] <10 08/26/2020   ETH <10 07/03/2019   Metabolic Disorder Labs: Lab Results  Component Value Date   HGBA1C 5.6 08/29/2020  MPG 114.02 08/29/2020   No results found for: PROLACTIN Lab Results  Component Value Date   CHOL 208 (H) 08/29/2020   TRIG 144 08/29/2020   HDL 48 08/29/2020   CHOLHDL 4.3 08/29/2020   VLDL 29 08/29/2020   LDLCALC 131 (H) 08/29/2020   LDLCALC 151 (H) 08/22/2020   Physical Findings: AIMS:  , ,  ,  ,    CIWA:    COWS:     Musculoskeletal: Strength & Muscle Tone: within normal limits Gait & Station: normal Patient leans: N/A  Psychiatric Specialty Exam: Physical Exam Vitals and nursing note reviewed.  HENT:     Mouth/Throat:     Pharynx: Oropharynx is clear.  Eyes:     Comments: Some yellowish drainage to left eye: On an antibiotic ophthalmic solution.  Neck:     Comments:  Patient presents with unsteady gait & balance, Currently using walker to ambulate. On 1:1 supervision. Cardiovascular:     Rate and Rhythm: Normal rate.  Pulmonary:     Effort: Pulmonary effort is normal.  Genitourinary:    Comments: Deferred Musculoskeletal:        General: Normal range of motion.  Skin:    General: Skin is warm and dry.  Neurological:     Mental Status: She is alert and oriented to person, place, and time.     Review of Systems  Constitutional: Negative for chills, diaphoresis and fever.  HENT: Negative for congestion, rhinorrhea, sneezing and sore throat.   Eyes: Negative for discharge.  Respiratory: Negative for chest tightness and shortness of breath.   Cardiovascular: Negative for chest pain and palpitations.  Gastrointestinal: Negative for diarrhea, nausea and vomiting.  Endocrine: Negative for cold intolerance.  Genitourinary: Negative for difficulty urinating.  Musculoskeletal: Positive for arthralgias, back pain and myalgias.       Patient presents with unsteady gait & balance, Currently using walker to ambulate. On 1:1 supervision.   Skin: Negative.   Allergic/Immunologic: Negative for environmental allergies and food allergies.       Allergies: NKDA  Neurological: Positive for weakness. Negative for dizziness, tremors, seizures, syncope, facial asymmetry, speech difficulty, light-headedness, numbness and headaches.  Psychiatric/Behavioral: Positive for confusion, decreased concentration, dysphoric mood and sleep disturbance. Negative for agitation, behavioral problems, hallucinations, self-injury and suicidal ideas. The patient is nervous/anxious. The patient is not hyperactive.     Blood pressure 118/89, pulse 96, temperature 98.3 F (36.8 C), temperature source Oral, resp. rate 18, height 5\' 3"  (1.6 m), weight 99.8 kg, SpO2 99 %.Body mass index is 38.97 kg/m.  General Appearance: Disheveled  Eye Contact:  Fair  Speech:  Clear and Coherent  Volume:   Decreased  Mood:  Anxious, Depressed and Dysphoric  Affect:  Flat  Thought Process:  Coherent and Descriptions of Associations: Circumstantial  Orientation:  Other:  Oriented to self & situation  Thought Content:  Rumination  Suicidal Thoughts:  Denies  Homicidal Thoughts:  Denies  Memory:  Immediate;   Fair Recent;   Poor Remote;   Poor  Judgement:  Fair  Insight:  Fair  Psychomotor Activity:  Restlessness  Concentration:  Concentration: Poor and Attention Span: Poor  Recall:  of Knowledge:  Fair  Language:  Fair  Akathisia:  Negative  Handed:  Right  AIMS (if indicated):     Assets:  Desire for Improvement Resilience  ADL's:  Impaired  Cognition:  Impaired,  Mild  Sleep:  Number of Hours: 5.5   Treatment Plan Summary: Daily  contact with patient to assess and evaluate symptoms and progress in treatment and Medication management.  Continue inpatient hospitalization. Will continue today 08/30/2020 plan as below except where it is noted.  Diagnosis. Bipolar 1 disorder, depressed.  Depression. Continue Wellbutrin 75 mg po daily. Continue Depakote DR 500 mg po Q evenings.  Insomnia. Initiated Melatonin 3 mg po Q hs to her sleep.  Continue polysporin ointment to both eyes tid for eye infection. Continue Albuterol inhaler 1-2 puffs Q 6 hrs prn for SOB. Continue Ensure liquid 237 ml bid between meals. Continue the 1:1 supervision for safety.  Encourage group participation. Discharge disposition plan in progress.  Armandina Stammer, NP, PMHNP, FNP-BC 08/30/2020, 1:24 PM

## 2020-08-30 NOTE — B[facility] Counselor (Signed)
Adult Comprehensive Assessment  Patient ID: Cynthia [name], female   DOB: [birth year], 59 y.o.   MRN: [id number]  Information Source:    Current Stressors:  Patient states their primary concerns and needs for treatment are:: "My daughter brought be because I wasn't taking care of myself" Patient states their goals for this hospitilization and ongoing recovery are:: "Not sure" Educational / Learning stressors: None Employment / Job issues: Pt is on disability Family Relationships: Pt talks to sister and daughter regularly Surveyor, quantity / Lack of resources (include bankruptcy): Pt is on disability and has [place] [employer] Insurance / Lack of housing: Pt states that she lives by herself Physical health (include injuries & life threatening diseases): None Social relationships: Only social relationships are with daughter and sister Substance abuse: Pt denies all substance use Bereavement / Loss: None  Living/Environment/Situation:  Living Arrangements: Alone Living conditions (as described by patient or guardian): "I like living alone" Who else lives in the home?: Only the Pt How long has patient lived in current situation?: "A while" What is atmosphere in current home: Comfortable  Family History:  Marital status: Divorced Divorced, when?: "It has been a few years" What types of issues is patient dealing with in the relationship?: Did not answer Additional relationship information: Was married for "over 20 years" Are you sexually active?: No What is your sexual orientation?: Did not answer Has your sexual activity been affected by drugs, alcohol, medication, or emotional stress?: Did not answer Does patient have children?: Yes How many children?: 3 How is patient's relationship with their children?: "all of my children are grown but we get along good"  Childhood History:  By whom was/is the patient raised?: Both parents Additional childhood history information: "I was a good kid" Description  of patient's relationship with caregiver when they were a child: "Good" Patient's description of current relationship with people who raised him/her: "They have both passed away now" How were you disciplined when you got in trouble as a child/adolescent?: "Spanked rarely" Does patient have siblings?: Yes Number of Siblings: 1 Description of patient's current relationship with siblings: "I have a sister and we get along good" Did patient suffer any verbal/emotional/physical/sexual abuse as a child?: No Did patient suffer from severe childhood neglect?: No Has patient ever been sexually abused/assaulted/raped as an adolescent or adult?: No Was the patient ever a victim of a crime or a disaster?: No Witnessed domestic violence?: No Has patient been affected by domestic violence as an adult?: No  Education:  Highest grade of school patient has completed: 12th grade, High School Currently a student?: No Learning disability?: No  Employment/Work Situation:   Employment situation: On disability Why is patient on disability: Did not answer How long has patient been on disability: "I am not sure. 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[employer], Actor SSDI Does patient have a Lawyer or guardian?: No  Alcohol/Substance Abuse:   What has been your use of drugs/alcohol within the last 12 months?: Pt denies all substance use If attempted suicide, did drugs/alcohol play a role in this?: No Alcohol/Substance Abuse Treatment Hx: Denies past history Has alcohol/substance abuse ever caused legal problems?: No  Social Support System:   [employer]  Support System: Poor Describe Community Support System:  Daughter and sister Type of faith/religion: Christain How does patient's faith help to cope with current illness?: Prayer  Leisure/Recreation:   Do You Have Hobbies?: No  Strengths/Needs:   What is the patient's perception of their strengths?: Did not answer Patient states they can use these personal strengths during their treatment to contribute to their recovery: Did not answer Patient states these barriers may affect/interfere with their treatment: Did not answer Patient states these barriers may affect their return to the community: Did not answer Other important information patient would like considered in planning for their treatment: Pt sees Cynthia [name] for therapy at [facility] in [place]  Discharge Plan:   Currently receiving community mental health services: Yes (From Whom) Patient states concerns and preferences for aftercare planning are: "I don't think my therapist is helping me very much" Patient states they will know when they are safe and ready for discharge when: Did not answer Does patient have access to transportation?: No Does patient have financial barriers related to discharge medications?: No Patient description of barriers related to discharge medications: None Plan for no access to transportation at discharge: The daughter will be picking the Pt up at discharge Will patient be returning to same living situation after discharge?: Yes  Summary/Recommendations:   Summary and Recommendations (to be completed by the evaluator): Cynthia [name] is a 59 year old female that was admitted to the hospital due to being disoriented and confused.  The Pt was unable to provide much infomration about herself during admission and continues to have difficulties answering and understanding questions. The Pt states that she lives alone and only has contact wither her daughter and sister.  The Pt states that she lacks motivation to get up and do anything other than  sleep.  The Pt states that she is unable to drive and receives Social Security Disability.  However, the Pt is not sure how long she has received disability or why she receives it.  The Pt gave minimal answer and required some additional time to think before answering.  While in the hospital the Pt can benefit from crisis stabilization, medication evaluation, group therapy, psycho-education, case management, and discharge planning.  Upon discharge the Pt's daughter will provide transportation and the Pt would like to return to her own home.  The Pt has stated that she does not feel that her current therapist is helping her and is interested in seeking another therapist in the community.  Aram Beecham. 08/30/2020

## 2020-08-30 NOTE — Progress Notes (Signed)
1:1 Note 0800 Patient has been in her room this morning.  1:1 present for safety.  Patient denied SI and HI, contracts for safety.  Denied A/V hallucinations.  Patient knows that she is at [facility], but does not know date/time. Safety maintained with 15 minute checks.

## 2020-08-30 NOTE — Progress Notes (Signed)
Pt continues on 1:1 sitter for safety due to an unwitnessed fall on 08/28/2020. Pt is currently resting in bed with her eyes open. Respirations are even and unlabored. No distress observed. Pt has been resting in bed since the beginning of the shift. Pt continues to be unsteady on her feet and fall risk prevention protocol is in place. Q 15 min safety checks continue. Pt's safety has been maintained.

## 2020-08-30 NOTE — Progress Notes (Signed)
1:1 Note 1200  Patient ate approximately 20% of her lunch.  Has been using walker this morning.  Patient stated she has trouble standing up after being in bed or sitting in chair.  Can walk after she gets out of bed.  Has trouble turning over in bed, difficult to lay on her back.  Has been feeling better now, no nausea.  Brushed teeth after lunch.  Respirations even and unlabored.  No signs/symptoms of pain/distress noted on patient's face/body movements.  1:1 continues for safety per MD order.

## 2020-08-30 NOTE — Progress Notes (Signed)
Pt continues on 1:1 sitter for safety due to unwitnessed fall on 08/28/2020. Sitter is readily available at the bedside. Pt is currently resting in bed. Respirations are even and unlabored. No distress observed. Q 15 min safety checks continue. Pt's safety has been maintained.

## 2020-08-30 NOTE — [facility] Suicide Risk Assessment (Signed)
[facility] INPATIENT:  Family/Significant Other Suicide Prevention Education  Suicide Prevention Education:  Education Completed; Morene Crocker [phone] (Daughter) has been identified by the patient as the family member/significant other with whom the patient will be residing, and identified as the person(s) who will aid the patient in the event of a mental health crisis (suicidal ideations/suicide attempt).  With written consent from the patient, the family member/significant other has been provided the following suicide prevention education, prior to the and/or following the discharge of the patient.  The suicide prevention education provided includes the following:  Suicide risk factors  Suicide prevention and interventions  National Suicide Hotline telephone number  [facility] assessment telephone number  [facility] Emergency Assistance 911  [facility] and/or Residential Mobile Crisis Unit telephone number  Request made of family/significant other to:  Remove weapons (e.g., guns, rifles, knives), all items previously/currently identified as safety concern.    Remove drugs/medications (over-the-counter, prescriptions, illicit drugs), all items previously/currently identified as a safety concern.  The family member/significant other verbalizes understanding of the suicide prevention education information provided.  The family member/significant other agrees to remove the items of safety concern listed above.  Mrs. Dorothey Baseman states that her mother has been in a "downward cycle" for approximately 6 months.  Mrs. Dorothey Baseman states that her mother was recently unable to function and seemed depressed.  Mrs. Dorothey Baseman states that she has seen her mother in these episodes before but never to this extent. Mrs. Dorothey Baseman states that her mother typically thrives while living on her own but is concerned about this if her mother refuses to take her medication as  prescribed.  Mrs. Dorothey Baseman states that her mother has therapy with Venia Minks at [facility] and that she also sees Dr. Sedalia Muta in [place] for medication.  Mrs. Dorothey Baseman is unsure if there was any recent medication changes but states that Mrs. Brewington reported to her that Ms. Barcia's pill count was no accurate each week and does not believe that she is taking the medication.  Mrs. Dorothey Baseman is not concerned about SI but is concerned about her mother's ability to function on her own in the future.    Met[employer] Kung Jedediah Noda 08/30/2020, 2:36 PM

## 2020-08-30 NOTE — Progress Notes (Signed)
Pt continues on 1:1 for safety due to high fall risk. Pt is currently resting in bed with her eyes closed. Pt is asleep and is snoring. Respirations are even and unlabored. No distress noted. Sitter readily available at bedside. Q 15 min safety checks continue. Pt's safety has been maintained.

## 2020-08-31 ENCOUNTER — Other Ambulatory Visit: Payer: Self-pay

## 2020-08-31 LAB — GLUCOSE, CAPILLARY
Glucose-Capillary: 106 mg/dL — ABNORMAL HIGH (ref 70–99)
Glucose-Capillary: 136 mg/dL — ABNORMAL HIGH (ref 70–99)
Glucose-Capillary: 121 mg/dL — ABNORMAL HIGH (ref 70–99)

## 2020-08-31 MED ORDER — ROSUVASTATIN CALCIUM 20 MG PO TABS: 20.0000 mg | ORAL_TABLET | Freq: Every day | ORAL | 3 refills | Status: DC

## 2020-08-31 NOTE — Progress Notes (Signed)
Recreation Therapy Notes  Date:  9.24.21 Time: 0930 Location: 300 Hall Dayroom  Group Topic: Stress Management  Goal Area(s) Addresses:  Patient will identify positive stress management techniques. Patient will identify benefits of using stress management post d/c.  Behavioral Response: Entgaged  Intervention: Stress Management  Activity: Guided Imagery.  LRT read [name] script that focused on going anywhere you envision by floating on [name] cloud.  Patients were to listen and follow along as script was read to engage in activity.    Education:  Stress Management, Discharge Planning.   Education Outcome: Acknowledges Education  Clinical Observations/Feedback:  Pt attended and participated in group.    [name], LRT/CTRS         Cynthia [name] 08/31/2020 10:19 AM 

## 2020-08-31 NOTE — Progress Notes (Signed)
Cynthia [name] was notified of results.

## 2020-08-31 NOTE — Progress Notes (Signed)
Nursing 1:1 note D:Pt observed at medication window. RR even and unlabored. No distress noted. Pt rated pain 7/10 in her hips. Provider notified. Pt wearing gait belt, using walker and accompanied by sitter. A: 1:1 observation continues for safety  R: Pt remains safe

## 2020-08-31 NOTE — Progress Notes (Signed)
   08/30/20 2040  Psych Admission Type (Psych Patients Only)  Admission Status Voluntary  Psychosocial Assessment  Patient Complaints Confusion;Anxiety;Depression  Eye Contact Poor  Facial Expression Flat  Affect Blunted;Flat  Speech Soft;Logical/coherent;Slow  Interaction Minimal;Forwards little;Isolative  Motor Activity Slow;Unsteady (1:1 sitter for safety)  Appearance/Hygiene Designer, industrial/product Cooperative;Anxious  Mood Depressed;Anxious;Sad  Thought Process  Coherency WDL  Content WDL  Delusions None reported or observed  Perception WDL  Hallucination None reported or observed  Judgment Poor  Confusion Mild  Danger to Self  Current suicidal ideation? Denies  Danger to Others  Danger to Others None reported or observed   Pt rates anxiety 7/10 and depression 7/10. Rated pain as 0/10 first but then asked for about something "for her hips" later on, stating that her pain is a 7/10. Pt answers questions slowly as if she is having trouble processing what is being said. Pt answers appropriately.

## 2020-08-31 NOTE — Progress Notes (Signed)
Pt denies SI/HI/AVH.  Pt is much more alert than she was on the day she was admitted at [facility] on 08/28/20. Pt continues to be on 1:1 close supervision.  Pt is somewhat more talkative than she has been.  Pt has been attending group meetings.  Pt taking medications without incident. RN will continue to monitor and provide support as indicated.

## 2020-08-31 NOTE — Progress Notes (Signed)
Adult Psychoeducational Group Note  Date:  08/31/2020 Time:  11:08 PM  Group Topic/Focus:  Wrap-Up Group:   The focus of this group is to help patients review their daily goal of treatment and discuss progress on daily workbooks.  Participation Level:  None  Participation Quality:  Inattentive  Affect:  Depressed  Cognitive:  Confused  Insight: None  Engagement in Group:  None  Modes of Intervention:  Education and Support  Additional Comments:  Cynthia [name] the meeting when it was almost over. She did not seem to know what was going on.  Cynthia [name]  Cynthia [name] 08/31/2020, 11:08 PM

## 2020-08-31 NOTE — Progress Notes (Signed)
Nursing 1:1 note D:Pt observed sleeping in bed with eyes closed. RR even and unlabored. No distress noted. A: 1:1 observation continues for safety  R: Pt remains safe  

## 2020-08-31 NOTE — Progress Notes (Signed)
[facility] MD Progress Note  08/31/2020 1:42 PM AARILYN DYE  MRN:  [id number]  Subjective: Saliah reports, "I'm doing a lot better today. I feel better. I went to group therapy yesterday & this morning. My mood has improved. I'm gaining my strength back. Look, I can even walk now without walker. The only problem is this jitteriness I feel inside of me, I just can't get rid of it. It has been going on x 2 weeks".  Objective: Patient is a 59 year old female who originally presented to the [facility] on 08/26/2020 accompanied by her daughter. The patient is a poor historian and the majority of information that is dictated today comes from old records. She was reported at that time to be desponded. The patient has a history of bipolar disorder as well as bipolar disorder; depression. The chief complaint at that time was "I just do not feel well". He was awake and alert x3 at that time. During that evaluation collateral information was obtained from the daughter which reported previous suicide attempts. They did not have a great deal of information at that time. The daughter reported the patient resides alone, and that she had done a "welfare check" on the date of admission. The patient was then transferred to the [facility] for medical clearance. The patient is followed by a nurse practitioner but again there is very little history from the patient. Kadi is seen, chart reviewed. The chart findings discussed with the treatment team. She presents more alert today, oriented & aware of situation. She presents with an improved, bright & reactive affect.  She is making good eye contact, verbally responsive, flashed some smiles during this follow-up care evaluation. She says she is doing a lot better. She says her mood has improved quite much & that she is gaining back her strength. She demonstrated this by getting out of bed & walked around her room  without walker or assistance. She reports has been attending group sessions since yesterday, seem to have enjoyed the group sessions so far. The only complaint she has today is what she described as some jitteriness within her that she can't seem to fight off. She is taking & tolerating her treatment regimen. She denies any SIHI, AVH, delusional thoughts or paranoia. She does not appear to be responding to any internal stimuli. She also complained that she did not think that she slept well last night, however, she adds that her sitter informed her that she was snoring loudly last night. I did increase her melatonin to 4 mg po Q hs to help improve her sleep. Will continue her current plan of care as already in progress. If patient continues to improve mentally & physically, may discontinue the 1:1 supervision & up grade her to close observation.  (Per previous assessment): Sejla is seen, chart reviewed. The chart findings discussed with the treatment team. She presents alert, sluggish, presenting & acting sickly. Her vital signs are stable. She remains on 1:1 supervision for safety. She is using a walker to support her mobility/balance. She is verbally responsive, but not making much of an eye contacts. Jasmine says she is not doing well today. She is unable to pin-point what exactly is wrong with her or why she is feeling so bad physically. She says she has been feeling depressed for a long time. Does not know the reasons for her depression. Says there are no mental illnesses in her family. Joline says she has been on  a lot of mental health medications in her life time although she is currently unable to remember their names. Says she did not & has benefited from any psychotropic medications over time. She also did not think that we will be able to help her get better here at [facility]. She is complaining of insomnia. Per documentation, she slept for about 5.5 hours last night. She spends her day lying down &  sleeping. That could be the reasons for the poor sleep at night. She reports fair appetite, does not like out food here at [facility]. She currently denies any SIHI, AVH, delusional thoughts or paranoia. She does not appear to be responding to any internal stimuli. Maizee is on ensure in between meals bid. She is currently not attending group sessions. Will add melatonin 3 mg po Q hs to aid her sleep. She says she is unable to remember the weird dreams she had last night. Denies they were nightmares.  Principal Problem: Bipolar 1 disorder, depressed, moderate (HCC)  Diagnosis: Principal Problem:   Bipolar 1 disorder, depressed, moderate (HCC) Active Problems:   MDD (major depressive disorder)  Total Time spent with patient: 20 minutes  Past Psychiatric History: Bipolar 1 disorder, depressed.  Past Medical History:  Past Medical History:  Diagnosis Date  . Bipolar 1 disorder (HCC)   . Essential hypertension   . GERD (gastroesophageal reflux disease)    History reviewed. No pertinent surgical history.  Family History:  Family History  Problem Relation Age of Onset  . Cancer Mother        lung  . Cancer Father        prostate  . Diabetes Paternal Grandfather    Family Psychiatric  History: See H&P.  Social History:  Social History   Substance and Sexual Activity  Alcohol Use Not Currently   Comment: rarely     Social History   Substance and Sexual Activity  Drug Use Never    Social History   Socioeconomic History  . Marital status: Divorced    Spouse name: Not on file  . Number of children: 3  . Years of education: Not on file  . Highest education level: Not on file  Occupational History  . Not on file  Tobacco Use  . Smoking status: Never Smoker  . Smokeless tobacco: Never Used  Substance and Sexual Activity  . Alcohol use: Not Currently    Comment: rarely  . Drug use: Never  . Sexual activity: Not on file  Other Topics Concern  . Not on file  Social History  Narrative  . Not on file   Social Determinants of Health   Financial Resource Strain:   . Difficulty of Paying Living Expenses: Not on file  Food Insecurity:   . Worried About Programme researcher, broadcasting/film/video in the Last Year: Not on file  . Ran Out of Food in the Last Year: Not on file  Transportation Needs:   . Lack of Transportation (Medical): Not on file  . Lack of Transportation (Non-Medical): Not on file  Physical Activity:   . Days of Exercise per Week: Not on file  . Minutes of Exercise per Session: Not on file  Stress:   . Feeling of Stress : Not on file  Social Connections:   . Frequency of Communication with Friends and Family: Not on file  . Frequency of Social Gatherings with Friends and Family: Not on file  . Attends Religious Services: Not on file  . Active Member  of Clubs or Organizations: Not on file  . Attends Banker Meetings: Not on file  . Marital Status: Not on file   Additional Social History:   Sleep: 6 hours per documentation, however, patient is reporting that she is not sleep well at night.  Appetite:  Good  Current Medications: Current Facility-Administered Medications  Medication Dose Route Frequency Provider Last Rate Last Admin  . albuterol (VENTOLIN HFA) 108 (90 Base) MCG/ACT inhaler 1-2 puff  1-2 puff Inhalation Q6H PRN Antonieta Pert, MD      . bacitracin-polymyxin b (ophth) (POLYSPORIN) ophthalmic ointment   Both Eyes TID Armandina Stammer I, NP   Given at 08/31/20 1226  . buPROPion [facility]) tablet 75 mg  75 mg Oral BID Antonieta Pert, MD   75 mg at 08/31/20 4098  . divalproex (DEPAKOTE) DR tablet 500 mg  500 mg Oral QPM Antonieta Pert, MD   500 mg at 08/30/20 1704  . feeding supplement (ENSURE ENLIVE) (ENSURE ENLIVE) liquid 237 mL  237 mL Oral BID BM Antonieta Pert, MD   237 mL at 08/31/20 1039  . ibuprofen (ADVIL) tablet 600 mg  600 mg Oral Q6H PRN Nira Conn A, NP   600 mg at 08/31/20 0625  . melatonin tablet 3 mg  3 mg  Oral QHS Armandina Stammer I, NP   3 mg at 08/30/20 2117    Lab Results:  Results for orders placed or performed during the hospital encounter of 08/28/20 (from the past 48 hour(s))  Valproic acid level     Status: Abnormal   Collection Time: 08/29/20  5:54 PM  Result Value Ref Range   Valproic Acid Lvl 41 (L) 50.0 - 100.0 ug/mL    Comment: Performed at [facility], [address], [place], [place] [address]  Glucose, capillary     Status: None   Collection Time: 08/30/20  6:00 AM  Result Value Ref Range   Glucose-Capillary 99 70 - 99 mg/dL    Comment: Glucose reference range applies only to samples taken after fasting for at least 8 hours.  Glucose, capillary     Status: Abnormal   Collection Time: 08/30/20 11:44 AM  Result Value Ref Range   Glucose-Capillary 113 (H) 70 - 99 mg/dL    Comment: Glucose reference range applies only to samples taken after fasting for at least 8 hours.  Glucose, capillary     Status: Abnormal   Collection Time: 08/30/20  5:00 PM  Result Value Ref Range   Glucose-Capillary 119 (H) 70 - 99 mg/dL    Comment: Glucose reference range applies only to samples taken after fasting for at least 8 hours.  Glucose, capillary     Status: Abnormal   Collection Time: 08/30/20  8:27 PM  Result Value Ref Range   Glucose-Capillary 109 (H) 70 - 99 mg/dL    Comment: Glucose reference range applies only to samples taken after fasting for at least 8 hours.  Glucose, capillary     Status: Abnormal   Collection Time: 08/31/20  5:59 AM  Result Value Ref Range   Glucose-Capillary 106 (H) 70 - 99 mg/dL    Comment: Glucose reference range applies only to samples taken after fasting for at least 8 hours.  Glucose, capillary     Status: Abnormal   Collection Time: 08/31/20  1:16 PM  Result Value Ref Range   Glucose-Capillary 136 (H) 70 - 99 mg/dL    Comment: Glucose reference range applies  only to samples taken after fasting for at least 8 hours.   Blood  Alcohol level:  Lab Results  Component Value Date   ETH <10 08/26/2020   ETH <10 07/03/2019   Metabolic Disorder Labs: Lab Results  Component Value Date   HGBA1C 5.6 08/29/2020   MPG 114.02 08/29/2020   No results found for: PROLACTIN Lab Results  Component Value Date   CHOL 208 (H) 08/29/2020   TRIG 144 08/29/2020   HDL 48 08/29/2020   CHOLHDL 4.3 08/29/2020   VLDL 29 08/29/2020   LDLCALC 131 (H) 08/29/2020   LDLCALC 151 (H) 08/22/2020   Physical Findings: AIMS:  , ,  ,  ,    CIWA:    COWS:     Musculoskeletal: Strength & Muscle Tone: within normal limits Gait & Station: normal Patient leans: N/A  Psychiatric Specialty Exam: Physical Exam Vitals and nursing note reviewed.  HENT:     Mouth/Throat:     Pharynx: Oropharynx is clear.  Eyes:     Comments: Some yellowish drainage to left eye: On an antibiotic ophthalmic solution.  Neck:     Comments: Patient presents with unsteady gait & balance, Currently using walker to ambulate. On 1:1 supervision. Cardiovascular:     Rate and Rhythm: Normal rate.  Pulmonary:     Effort: Pulmonary effort is normal.  Genitourinary:    Comments: Deferred Musculoskeletal:        General: Normal range of motion.  Skin:    General: Skin is warm and dry.  Neurological:     Mental Status: She is alert and oriented to person, place, and time.     Review of Systems  Constitutional: Negative for chills, diaphoresis and fever.  HENT: Negative for congestion, rhinorrhea, sneezing and sore throat.   Eyes: Negative for discharge.  Respiratory: Negative for chest tightness and shortness of breath.   Cardiovascular: Negative for chest pain and palpitations.  Gastrointestinal: Negative for diarrhea, nausea and vomiting.  Endocrine: Negative for cold intolerance.  Genitourinary: Negative for difficulty urinating.  Musculoskeletal: Positive for arthralgias, back pain and myalgias.       Patient presents with unsteady gait & balance,  Currently using walker to ambulate. On 1:1 supervision.   Skin: Negative.   Allergic/Immunologic: Negative for environmental allergies and food allergies.       Allergies: NKDA  Neurological: Positive for weakness. Negative for dizziness, tremors, seizures, syncope, facial asymmetry, speech difficulty, light-headedness, numbness and headaches.  Psychiatric/Behavioral: Positive for confusion, decreased concentration, dysphoric mood and sleep disturbance. Negative for agitation, behavioral problems, hallucinations, self-injury and suicidal ideas. The patient is nervous/anxious. The patient is not hyperactive.     Blood pressure 114/80, pulse 97, temperature 98 F (36.7 C), temperature source Oral, resp. rate 18, height 5\' 3"  (1.6 m), weight 99.8 kg, SpO2 100 %.Body mass index is 38.97 kg/m.  General Appearance: Disheveled  Eye Contact:  Good  Speech:  Clear and Coherent  Volume:  Normal  Mood:  "I'm feeling a lot better today".  Affect:  Appropriate  Thought Process:  Coherent and Descriptions of Associations: Intact  Orientation:  Full (Time, Place, and Person)  Thought Content:  Logical, denies any hallucinations, delusions or paranoia  Suicidal Thoughts:  Denies  Homicidal Thoughts:  Denies  Memory:  Immediate;   Good Recent;   Fair Remote;   Fair  Judgement:  Intact  Insight:  Present  Psychomotor Activity:  Still showing signs of restlessness, constant moving.  Concentration:  Concentration: Fair  and Attention Span: Fair  Recall:  FiservFair  Fund of Knowledge:  Fair  Language:  Good  Akathisia:  Negative  Handed:  Right  AIMS (if indicated):     Assets:  Desire for Improvement Resilience  ADL's:  Impaired  Cognition:  WNL  Sleep:  Number of Hours: 6   Treatment Plan Summary: Daily contact with patient to assess and evaluate symptoms and progress in treatment and Medication management.  Continue inpatient hospitalization. Will continue today 08/31/2020 plan as below except  where it is noted.  Diagnosis. Bipolar 1 disorder, depressed.  Depression. Continue Wellbutrin 75 mg po daily. Continue Depakote DR 500 mg po Q evenings.  Insomnia. Increased Melatonin to 4 mg po Q hs to her sleep.  Continue polysporin ointment to both eyes tid for eye infection. Continue Albuterol inhaler 1-2 puffs Q 6 hrs prn for SOB. Continue Ensure liquid 237 ml bid between meals. Continue the 1:1 supervision for safety.  Encourage group participation. Discharge disposition plan in progress.  Armandina StammerAgnes Breccan Galant, NP, PMHNP, FNP-BC 08/31/2020, 1:42 PMPatient ID: Sanjuan DameKimberly H Reuter, female   DOB: [birth year], 59 y.o.   MRN: [id number]

## 2020-09-01 LAB — GLUCOSE, CAPILLARY
Glucose-Capillary: 98 mg/dL (ref 70–99)
Glucose-Capillary: 91 mg/dL (ref 70–99)
Glucose-Capillary: 73 mg/dL (ref 70–99)

## 2020-09-01 MED ORDER — ONDANSETRON 4 MG PO TBDP
4.0000 mg | ORAL_TABLET | Freq: Once | ORAL | Status: AC
  Administered 2020-09-01: 4 mg via ORAL
  Filled 2020-09-01 (×2): qty 1

## 2020-09-01 MED ORDER — HYDROXYZINE HCL 10 MG PO TABS
20.0000 mg | ORAL_TABLET | Freq: Three times a day (TID) | ORAL | Status: DC | PRN
  Administered 2020-09-02 – 2020-09-07 (×5): 20 mg via ORAL
  Filled 2020-09-01 (×5): qty 2

## 2020-09-01 NOTE — Progress Notes (Addendum)
1:1 Observation note: Patient assessed ambulating this morning with walker and was noted to ambulate with a slow and steady gait. Sitter present and within arm's length for safety. The patient stated that she feels safe to ambulate with the walker without a sitter. Will continue to monitor.   Pt remains on 1:1 observation for safety until d/c'd.   Patient denies SI/HI. She reports decreased depression today. She reported that she's been eating, bathing and attending groups. She c/o feeling nauseated this morning and was administered Zofran. Orders reviewed. Vital signs reviewed. Patient encouraged to attend groups as tolerated. 15 minute checks performed for safety

## 2020-09-01 NOTE — Progress Notes (Addendum)
[facility] MD Progress Note  09/01/2020 2:56 PM Cynthia [name]  MRN:  [id number]  Subjective: " I am  walking better today, I feel better today and  I don't need anybody sit with me"   Objective: Patient is a 60 year old female who originally presented to the [facility] on 08/26/2020 accompanied by her daughter.  Patient was seen in her room this morning and required 1:1 observation for fall risk.  Report was received and chart reviewed.   Patient reports that she is gradually improving.  Initially she did  Not believe that her medications were effective few days ago.  She reports better sleep and better appetite in the unit.  She reports eating two to three times in the unit but at home appetite has been  poor.   This morning she complained of feeling nauseated and received Zofran which is effective.  She attended group therapy today but left early as she became anxious and started crying saying she is "trapped"  Hydroxyzine is prescribed for patient.  This evening writer saw her walking with a walker, gait is steady and she is much calmer. ECT Treatment: Writer asked patient if she has had ECT treatment in the past she replied no and would like to know about the treatment and how much it cost.  Written information regarding ECT treatment to be given.  SW will look into cost for this treatment for patient.  Patient left the unit with peers for socialization and game.  Patient denies feeling suicidal and she has no thoughts of wanting to hurt self.   We will continue current plan of care and medications. Principal Problem: Bipolar 1 disorder, depressed, moderate (HCC)  Diagnosis: Principal Problem:   Bipolar 1 disorder, depressed, moderate (HCC) Active Problems:   MDD (major depressive disorder)  Total Time spent with patient: 20 minutes  Past Psychiatric History: Bipolar 1 disorder, depressed.  Past Medical History:  Past Medical History:  Diagnosis Date  . Bipolar 1  disorder (HCC)   . Essential hypertension   . GERD (gastroesophageal reflux disease)    History reviewed. No pertinent surgical history.  Family History:  Family History  Problem Relation Age of Onset  . Cancer Mother        lung  . Cancer Father        prostate  . Diabetes Paternal Grandfather    Family Psychiatric  History: See H&P.  Social History:  Social History   Substance and Sexual Activity  Alcohol Use Not Currently   Comment: rarely     Social History   Substance and Sexual Activity  Drug Use Never    Social History   Socioeconomic History  . Marital status: Divorced    Spouse name: Not on file  . Number of children: 3  . Years of education: Not on file  . Highest education level: Not on file  Occupational History  . Not on file  Tobacco Use  . Smoking status: Never Smoker  . Smokeless tobacco: Never Used  Substance and Sexual Activity  . Alcohol use: Not Currently    Comment: rarely  . Drug use: Never  . Sexual activity: Not on file  Other Topics Concern  . Not on file  Social History Narrative  . Not on file   Social Determinants of Health   Financial Resource Strain:   . Difficulty of Paying Living Expenses: Not on file  Food Insecurity:   . Worried About [employer] of  Food in the Last Year: Not on file  . Ran Out of Food in the Last Year: Not on file  Transportation Needs:   . Lack of Transportation (Medical): Not on file  . Lack of Transportation (Non-Medical): Not on file  Physical Activity:   . Days of Exercise per Week: Not on file  . Minutes of Exercise per Session: Not on file  Stress:   . Feeling of Stress : Not on file  Social Connections:   . Frequency of Communication with Friends and Family: Not on file  . Frequency of Social Gatherings with Friends and Family: Not on file  . Attends Religious Services: Not on file  . Active Member of Clubs or Organizations: Not on file  . Attends [employer]er[employer] or Organization Meetings: Not on  file  . Marital Status: Not on file   Additional Social History:   Sleep: Good  Appetite:  Good  Current Medications: Current Facility-Administered Medications  Medication Dose Route Frequency Provider Last Rate Last Admin  . albuterol (VENTOLIN HFA) 108 (90 Base) MCG/ACT inhaler 1-2 puff  1-2 puff Inhalation Q6H PRN Antonieta Pertlary, Greg Lawson, MD      . bacitracin-polymyxin b (ophth) (POLYSPORIN) ophthalmic ointment   Both Eyes TID Armandina StammerNwoko, Agnes I, NP   Given at 09/01/20 1159  . buPROPion [employer](WELLBUTRIN) tablet 75 mg  75 mg Oral BID Antonieta Pertlary, Greg Lawson, MD   75 mg at 09/01/20 0819  . divalproex (DEPAKOTE) DR tablet 500 mg  500 mg Oral QPM Antonieta Pertlary, Greg Lawson, MD   500 mg at 08/31/20 1752  . feeding supplement (ENSURE ENLIVE) (ENSURE ENLIVE) liquid 237 mL  237 mL Oral BID BM Antonieta Pertlary, Greg Lawson, MD   237 mL at 08/31/20 1425  . hydrOXYzine (ATARAX/VISTARIL) tablet 20 mg  20 mg Oral TID PRN Dahlia Byesnuoha, Fouad Taul C, NP      . ibuprofen (ADVIL) tablet 600 mg  600 mg Oral Q6H PRN Nira ConnBerry, Jason A, NP   600 mg at 08/31/20 [id number]  . melatonin tablet 3 mg  3 mg Oral QHS Armandina StammerNwoko, Agnes I, NP   3 mg at 08/31/20 2112    Lab Results:  Results for orders placed or performed during the hospital encounter of 08/28/20 (from the past 48 hour(s))  Glucose, capillary     Status: Abnormal   Collection Time: 08/30/20  5:00 PM  Result Value Ref Range   Glucose-Capillary 119 (H) 70 - 99 mg/dL    Comment: Glucose reference range applies only to samples taken after fasting for at least 8 hours.  Glucose, capillary     Status: Abnormal   Collection Time: 08/30/20  8:27 PM  Result Value Ref Range   Glucose-Capillary 109 (H) 70 - 99 mg/dL    Comment: Glucose reference range applies only to samples taken after fasting for at least 8 hours.  Glucose, capillary     Status: Abnormal   Collection Time: 08/31/20  5:59 AM  Result Value Ref Range   Glucose-Capillary 106 (H) 70 - 99 mg/dL    Comment: Glucose reference range applies only to  samples taken after fasting for at least 8 hours.  Glucose, capillary     Status: Abnormal   Collection Time: 08/31/20  1:16 PM  Result Value Ref Range   Glucose-Capillary 136 (H) 70 - 99 mg/dL    Comment: Glucose reference range applies only to samples taken after fasting for at least 8 hours.  Glucose, capillary     Status: Abnormal  Collection Time: 08/31/20  5:49 PM  Result Value Ref Range   Glucose-Capillary 121 (H) 70 - 99 mg/dL    Comment: Glucose reference range applies only to samples taken after fasting for at least 8 hours.  Glucose, capillary     Status: None   Collection Time: 09/01/20  7:15 AM  Result Value Ref Range   Glucose-Capillary 98 70 - 99 mg/dL    Comment: Glucose reference range applies only to samples taken after fasting for at least 8 hours.  Glucose, capillary     Status: None   Collection Time: 09/01/20 11:44 AM  Result Value Ref Range   Glucose-Capillary 91 70 - 99 mg/dL    Comment: Glucose reference range applies only to samples taken after fasting for at least 8 hours.   Blood Alcohol level:  Lab Results  Component Value Date   ETH <10 08/26/2020   ETH <10 07/03/2019   Metabolic Disorder Labs: Lab Results  Component Value Date   HGBA1C 5.6 08/29/2020   MPG 114.02 08/29/2020   No results found for: PROLACTIN Lab Results  Component Value Date   CHOL 208 (H) 08/29/2020   TRIG 144 08/29/2020   HDL 48 08/29/2020   CHOLHDL 4.3 08/29/2020   VLDL 29 08/29/2020   LDLCALC 131 (H) 08/29/2020   LDLCALC 151 (H) 08/22/2020   Physical Findings: AIMS:  , ,  ,  ,    CIWA:    COWS:     Musculoskeletal: Strength & Muscle Tone: within normal limits Gait & Station: normal with walker. Patient leans: N/A  Psychiatric Specialty Exam: Physical Exam Vitals and nursing note reviewed.  HENT:     Mouth/Throat:     Pharynx: Oropharynx is clear.  Eyes:     Comments: Some yellowish drainage to left eye: On an antibiotic ophthalmic solution.  Neck:      Comments: Patient presents with unsteady gait & balance, Currently using walker to ambulate.  Taken off 1:1 as staff witness gait is steady.  Patient needs reminder to use her walker. Cardiovascular:     Rate and Rhythm: Normal rate.  Pulmonary:     Effort: Pulmonary effort is normal.  Genitourinary:    Comments: Deferred Musculoskeletal:        General: Normal range of motion.  Skin:    General: Skin is warm and dry.  Neurological:     Mental Status: She is alert and oriented to person, place, and time.     Review of Systems  Constitutional: Negative for chills, diaphoresis and fever.  HENT: Negative for congestion, rhinorrhea, sneezing and sore throat.   Eyes: Negative for discharge.  Respiratory: Negative for chest tightness and shortness of breath.   Cardiovascular: Negative for chest pain and palpitations.  Gastrointestinal: Negative for diarrhea, nausea and vomiting.  Endocrine: Negative for cold intolerance.  Genitourinary: Negative for difficulty urinating.  Musculoskeletal: Positive for arthralgias, back pain and myalgias.       Patient presents with unsteady gait & balance, Currently using walker to ambulate. Taken off 1:1 observation for fall.  Nursing staff to constantly remind her to use her walker.   Skin: Negative.   Allergic/Immunologic: Negative for environmental allergies and food allergies.       Allergies: NKDA  Neurological: Positive for weakness. Negative for dizziness, tremors, seizures, syncope, facial asymmetry, speech difficulty, light-headedness, numbness and headaches.  Psychiatric/Behavioral: Positive for confusion, decreased concentration, dysphoric mood and sleep disturbance. Negative for agitation, behavioral problems, hallucinations, self-injury and suicidal ideas.  The patient is nervous/anxious. The patient is not hyperactive.     Blood pressure 135/87, pulse 86, temperature 98.2 F (36.8 C), temperature source Oral, resp. rate 18, height 5\' 3"  (1.6  m), weight 99.8 kg, SpO2 100 %.Body mass index is 38.97 kg/m.  General Appearance: Disheveled  Eye Contact:  Good  Speech:  Clear and Coherent  Volume:  Normal  Mood:  " I am making little progress, still depressed"  Affect:  Appropriate  Thought Process:  Coherent and Descriptions of Associations: Intact  Orientation:  Full (Time, Place, and Person)  Thought Content:  Logical, denies any hallucinations, delusions or paranoia  Suicidal Thoughts:  Denies  Homicidal Thoughts:  Denies  Memory:  Immediate;   Good Recent;   Fair Remote;   Fair  Judgement:  Intact  Insight:  Present  Psychomotor Activity:  Normal  Concentration:  Concentration: Fair and Attention Span: Fair  Recall:  of Knowledge:  Fair  Language:  Good  Akathisia:  Negative  Handed:  Right  AIMS (if indicated):     Assets:  Desire for Improvement Resilience  ADL's:  Impaired  Cognition:  WNL  Sleep:  Number of Hours: 7   Treatment Plan Summary: Daily contact with patient to assess and evaluate symptoms and progress in treatment and Medication management.  Continue inpatient hospitalization. Will continue today 09/01/2020  Diagnosis. Bipolar 1 disorder, depressed.  Depression. Continue Wellbutrin 75 mg po daily. Continue Depakote DR 500 mg po Q evenings.  Insomnia. Continue Melatonin to 4 mg po Q hs to her sleep. Anxiety Offer Hydroxyzine 20 mg po tid as needed for anxiety  Continue polysporin ointment to both eyes tid for eye infection. Continue Albuterol inhaler 1-2 puffs Q 6 hrs prn for SOB. Continue Ensure liquid 237 ml bid between meals. DisContinue the 1:1 supervision for safety- Patient stable with her walker.  Encourage group participation. Discharge disposition plan in progress.  09/03/2020, NP, PMHNP 09/01/2020, 2:56 PM

## 2020-09-01 NOTE — Progress Notes (Signed)
   08/31/20 2010  Psych Admission Type (Psych Patients Only)  Admission Status Voluntary  Psychosocial Assessment  Patient Complaints Anxiety;Depression  Eye Contact Brief;Poor  Facial Expression Flat  Affect Appropriate to circumstance  Speech Logical/coherent;Soft;Slow  Interaction Assertive  Motor Activity Slow;Unsteady;Other (Comment) (1:1 for safety due to high fall risk)  Appearance/Hygiene Improved  Behavior Characteristics Cooperative;Appropriate to situation;Anxious;Calm  Mood Depressed;Anxious;Sad;Pleasant  Thought Process  Coherency WDL  Content WDL  Delusions None reported or observed  Perception WDL  Hallucination None reported or observed  Judgment Poor  Confusion None  Danger to Self  Current suicidal ideation? Denies  Danger to Others  Danger to Others None reported or observed

## 2020-09-01 NOTE — Progress Notes (Signed)
Psychoeducational Group Note  Date:  09/01/2020 Time: 2030 Group Topic/Focus:  wrap up group  Participation Level: Did Not Attend  Participation Quality:  Not Applicable  Affect:  Not Applicable  Cognitive:  Not Applicable  Insight:  Not Applicable  Engagement in Group: Not Applicable  Additional Comments:  Pt was notified that group was beginning but remained in bed.   Marcille Buffy 09/01/2020, 9:39 PM

## 2020-09-01 NOTE — [facility] Group Notes (Signed)
.  Psychoeducational Group Note    Date: 08-25-20 Time: 0900-1000    Goal Setting   Purpose of Group: This group helps to provide patients with the steps of setting a goal that is specific, measurable, attainable, realistic and time specific. A discussion on how we keep ourselves stuck with negative self talk.    Participation Level:  Active and stayed for as long as she could. Then got up and walked out. Cried throughout the group.  Dione Housekeeper

## 2020-09-01 NOTE — Progress Notes (Signed)
   09/01/20 1935  COVID-19 Daily Checkoff  Have you had a fever (temp > 37.80C/100F)  in the past 24 hours?  No  COVID-19 EXPOSURE  Have you traveled outside the state in the past 14 days? No  Have you been in contact with someone with a confirmed diagnosis of COVID-19 or PUI in the past 14 days without wearing appropriate PPE? No  Have you been living in the same home as a person with confirmed diagnosis of COVID-19 or a PUI (household contact)? No  Have you been diagnosed with COVID-19? No

## 2020-09-01 NOTE — Progress Notes (Signed)
   08/31/20 2010  COVID-19 Daily Checkoff  Have you had a fever (temp > 37.80C/100F)  in the past 24 hours?  No  COVID-19 EXPOSURE  Have you traveled outside the state in the past 14 days? No  Have you been in contact with someone with a confirmed diagnosis of COVID-19 or PUI in the past 14 days without wearing appropriate PPE? No  Have you been living in the same home as a person with confirmed diagnosis of COVID-19 or a PUI (household contact)? No  Have you been diagnosed with COVID-19? No

## 2020-09-01 NOTE — Progress Notes (Signed)
Pt continues on 1:1 sitter for safety due to high fall risk. Pt is currently resting in bed with her eyes closed. Respirations are even and unlabored. No distress observed. Q 15 min safety checks continue. Pt's safety has been maintained.

## 2020-09-01 NOTE — [facility] Group Notes (Signed)
Psychoeducational Group Note    Date: 09-01-2020 Time: 1300-1400    Life Skills: This group discusses  Purpose of Group: . The group focus' on teaching patients on how to identify their needs and how to develop the coping skills needed to get their needs met  Participation Level:  Pt was not able to stay in the room with everyone. Stated later "everything is just rubbing me the wrong way and I am getting irritated  .  Paulino Rily

## 2020-09-01 NOTE — Progress Notes (Signed)
Pt on 1:1 sitter for safety due to high fall risk. Pt had an unwitnessed fall this admission. Pt continues to use assistance of a walker and gait belt for ambulation. Pt's gait has improved but she still endorses dizziness when she stands up too quickly. Pt educated to change positions slowly and dangle on the side of her bed for a minute or two before standing up. Pt denies any dizziness or lightheadedness. She is currently resting in bed with her eyes closed. Respirations are even and unlabored. No distress observed. Q 15 min safety checks continue. Pt's safety has been maintained. Sitter readily available within arms reach. Verbal order obtained for 1:1 from Lerry Liner, NP.

## 2020-09-01 NOTE — Progress Notes (Signed)
Pt reports having an "okay" day. Pt shares that she did attend group during the day and she participated in an activity where she closed her eyes. They were asked to imagine as if they are light as a feather and pt said she enjoyed learning this relaxation technique. She said that at home she would focus on one part of her body to relieve stress. She still is slow to respond to questions and sometimes there are pauses in between. She reports having an "okay" appetite and that she has been eating more than 1/2 of her meals. Pt's goal is to keep herself busy when she is discharged. Pt has been more open to answering questions and has been present in the dayroom. She did attend group tonight. Pt has been looking after her personal hygiene and said that she showered today without any assistance. She appears more alert tonight and pleasantly responds to questions. Medications administered as ordered by MD. Active listening, reassurance, and support provided. Pt denies SI/HI and AVH. Q 15 min safety checks continue. Pt's safety has been maintained.

## 2020-09-01 NOTE — [facility] Group Notes (Signed)
[facility] Group Notes: (Clinical Social Work)   09/01/2020      Type of Therapy:  Group Therapy   Participation Level:  Did Not Attend - was invited both individually by MHT and by overhead announcement, chose not to attend.   Xaniyah Buchholz Grossman-Orr, LCSW 09/01/2020, 1:54 PM    

## 2020-09-02 LAB — GLUCOSE, CAPILLARY
Glucose-Capillary: 87 mg/dL (ref 70–99)
Glucose-Capillary: 93 mg/dL (ref 70–99)
Glucose-Capillary: 98 mg/dL (ref 70–99)

## 2020-09-02 MED ORDER — BUPROPION HCL ER (XL) 150 MG PO TB24
150.0000 mg | ORAL_TABLET | Freq: Every day | ORAL | Status: DC
  Administered 2020-09-03 – 2020-09-07 (×5): 150 mg via ORAL
  Filled 2020-09-02 (×7): qty 1

## 2020-09-02 MED ORDER — NICOTINE 14 MG/24HR TD PT24
MEDICATED_PATCH | TRANSDERMAL | Status: AC
  Filled 2020-09-02: qty 1

## 2020-09-02 NOTE — [facility] Group Notes (Signed)
[facility] LCSW Group Therapy Note  09/02/2020    Type of Therapy and Topic:  Group Therapy:  Adding Supports Including Yourself  Participation Level:  None   Description of Group:   Patients in this group were introduced to the concept that additional supports including self-support are an essential part of recovery.  Patients listed what supports they believe they need to add to their lives to achieve their goals at discharge, and they listed such things as therapist, family, doctor, support groups, 12-step groups and service animals.   A song entitled "My Own Hero" was played and a group discussion ensued in which patients stated they could relate to the song and it inspired them to realize they have be willing to help themselves in order to succeed, because other people cannot achieve sobriety or stability for them.  "Fight For It" was played, then "I Am Enough" to encourage patients.  They discussed the impact on them and how they must remain convinced that their lives are worth the effort it takes to become sober and/or stable.  Therapeutic Goals: 1)  demonstrate the importance of being a key part of one's own support system 2)  discuss various available supports 3)  encourage patient to use music as part of their self-support and focus on goals 4)  elicit ideas from patients about supports that need to be added   Summary of Patient Progress:  The patient arrived with only 10 minutes left in group and when asked whether the last song had said anything to her, she responded, "No."  Her affect was flat and she appeared disinterested.   Therapeutic Modalities:   Motivational Interviewing Activity  Loran Fleet J Grossman-Orr  9:00 AM

## 2020-09-02 NOTE — Progress Notes (Signed)
Patient presents with a flat affect and depressed mood. She was observed in her room lying down in bed sad and tearful this morning. She reports feeling anxious this morning and stated that groups trigger her anxiety. She was administered Vistaril for anxiety. She reports having some relief from the Vistaril.  She denies SI/HI. She reports poor sleep last night, but was unable to elaborate on why she did not sleep well.   Orders reviewed. Vital signs reviewed. Verbal support provided. Pt encouraged to attend groups. 15 minute checks performed for safety. Information on ECT was provided to the pt.   Pt compliant with treatment plan and denies any medication side effects.

## 2020-09-02 NOTE — Progress Notes (Signed)
Pt up and walking to the medication window with her walker to get her bedtime medication. Pt has her yellow non-skid socks on and has a steady gait. Denies any dizziness or light-headedness.

## 2020-09-02 NOTE — Progress Notes (Signed)
   09/02/20 2348  COVID-19 Daily Checkoff  Have you had a fever (temp > 37.80C/100F)  in the past 24 hours?  No  If you have had runny nose, nasal congestion, sneezing in the past 24 hours, has it worsened? No  COVID-19 EXPOSURE  Have you traveled outside the state in the past 14 days? No  Have you been in contact with someone with a confirmed diagnosis of COVID-19 or PUI in the past 14 days without wearing appropriate PPE? No  Have you been living in the same home as a person with confirmed diagnosis of COVID-19 or a PUI (household contact)? No  Have you been diagnosed with COVID-19? No

## 2020-09-02 NOTE — Progress Notes (Signed)
Pt greeted in her room as she was resting in bed. Pt said her day was "not so good." She said she didn't feel good earlier, she had some nausea and was tearful. Pt said that she was tearful because she has anxiety when she's in the dayroom and participating in group. Pt informed about relaxation techniques and her PRN order for vistaril. Pt denies any N/V tonight. She still isn't able to identify any stressors or triggers leading up to her admission. Pt did make a phone call tonight but wasn't crying any. Active listening, reassurance, and support provided. Pt denies SI/HI and AVH. Q 15 min safety checks continue. Pt's safety has been maintained.   09/01/20 1935  Psych Admission Type (Psych Patients Only)  Admission Status Voluntary  Psychosocial Assessment  Patient Complaints Depression;Sadness;Anxiety;Crying spells  Eye Contact Fair  Facial Expression Flat  Affect Appropriate to circumstance;Sad;Sullen  Speech Logical/coherent;Soft;Slow  Interaction Assertive  Motor Activity Slow;Other (Comment) (uses walker)  Appearance/Hygiene Unremarkable  Behavior Characteristics Cooperative;Appropriate to situation;Calm  Mood Depressed;Sad;Pleasant  Thought Process  Coherency WDL  Content WDL  Delusions None reported or observed  Perception WDL  Hallucination None reported or observed  Judgment Poor  Confusion None  Danger to Self  Current suicidal ideation? Denies  Danger to Others  Danger to Others None reported or observed

## 2020-09-02 NOTE — Progress Notes (Signed)
   09/02/20 2352  Psych Admission Type (Psych Patients Only)  Admission Status Voluntary  Psychosocial Assessment  Patient Complaints Depression  Eye Contact Brief  Facial Expression Flat  Affect Appropriate to circumstance  Speech Logical/coherent;Soft  Interaction Minimal  Motor Activity Slow;Unsteady (Pt utilizes walker for ambulation)  Appearance/Hygiene Unremarkable  Behavior Characteristics Appropriate to situation  Mood Depressed  Thought Process  Coherency WDL  Content WDL  Delusions None reported or observed  Perception WDL  Hallucination None reported or observed  Judgment Poor  Confusion None  Danger to Self  Current suicidal ideation? Denies  Danger to Others  Danger to Others None reported or observed

## 2020-09-02 NOTE — Progress Notes (Signed)
Pt is currently resting in bed with her eyes closed. Respirations are even and unlabored. No distress observed. Pt has her yellow non-skid socks on and her walker at the bedside. Q 15 min safety checks continue. Pt's safety has been maintained.

## 2020-09-02 NOTE — Progress Notes (Signed)
[facility]  09/02/2020 2:27 PM Cynthia [name]  MRN:  [id number]  Subjective: " I am depressed, I get more anxious in group" Objective: Patient is a 59 year old female who is evaluated today for depression and anxiety.  Patient remains anxious and presents a flat affect.  She responds to questions in monotone and states she does not know the reason why she is depressed.  She reports worst anxiety when she is group activity.  Patient has been given written information regarding ECT treatment.  Writer explained to her the protocol and length of treatment.  She was informed she would not stay in the hospital for the period she is receiving treatment.  She promises to read the information given and discuss with her children before agreeing to ECT treatment.  She is ambulating with and without walker. Due to the intensity of her Depression and mood worst as the day goes by we will change her Wellbutrin to long acting dose of 150 mg daily.  She denies suicide ideation and no thoughts of wanting to harm self.  No AVH and no mention of paranoia.  Patient will be moved to [facility] for ECT treatment once she agrees to take treatment.  Principal Problem: Bipolar 1 disorder, depressed, moderate (HCC)  Diagnosis: Principal Problem:   Bipolar 1 disorder, depressed, moderate (HCC) Active Problems:   MDD (major depressive disorder)  Total Time spent with patient: 20 minutes  Past Psychiatric History: Bipolar 1 disorder, depressed.  Past Medical History:  Past Medical History:  Diagnosis Date  . Bipolar 1 disorder (HCC)   . Essential hypertension   . GERD (gastroesophageal reflux disease)    History reviewed. No pertinent surgical history.  Family History:  Family History  Problem Relation Age of Onset  . Cancer Mother        lung  . Cancer Father        prostate  . Diabetes Paternal Grandfather    Family Psychiatric  History: See H&P.  Social History:  Social History    Substance and Sexual Activity  Alcohol Use Not Currently   Comment: rarely     Social History   Substance and Sexual Activity  Drug Use Never    Social History   Socioeconomic History  . Marital status: Divorced    Spouse name: Not on file  . Number of children: 3  . Years of education: Not on file  . Highest education level: Not on file  Occupational History  . Not on file  Tobacco Use  . Smoking status: Never Smoker  . Smokeless tobacco: Never Used  Substance and Sexual Activity  . Alcohol use: Not Currently    Comment: rarely  . Drug use: Never  . Sexual activity: Not on file  Other Topics Concern  . Not on file  Social History Narrative  . Not on file   Social Determinants of Health   Financial Resource Strain:   . Difficulty of Paying Living Expenses: Not on file  Food Insecurity:   . Worried About Programme researcher, broadcasting/film/videounning Out of Food in the Last Year: Not on file  . Ran Out of Food in the Last Year: Not on file  Transportation Needs:   . Lack of Transportation (Medical): Not on file  . Lack of Transportation (Non-Medical): Not on file  Physical Activity:   . Days of Exercise per Week: Not on file  . Minutes of Exercise per Session: Not on file  Stress:   .  Feeling of Stress : Not on file  Social Connections:   . Frequency of Communication with Friends and Family: Not on file  . Frequency of Social Gatherings with Friends and Family: Not on file  . Attends Religious Services: Not on file  . Active Member of Clubs or Organizations: Not on file  . Attends Banker Meetings: Not on file  . Marital Status: Not on file   Additional Social History:   Sleep: Good  Appetite:  Good  Current Medications: Current Facility-Administered Medications  Medication Dose Route Frequency Provider Last Rate Last Admin  . albuterol (VENTOLIN HFA) 108 (90 Base) MCG/ACT inhaler 1-2 puff  1-2 puff Inhalation Q6H PRN Antonieta Pert, MD      . bacitracin-polymyxin b  (ophth) (POLYSPORIN) ophthalmic ointment   Both Eyes TID Armandina Stammer I, NP   Given at 09/02/20 [phone]  . [START ON 09/03/2020] buPROPion (WELLBUTRIN XL) 24 hr tablet 150 mg  150 mg Oral Daily Ryllie Nieland C, NP      . divalproex (DEPAKOTE) DR tablet 500 mg  500 mg Oral QPM Antonieta Pert, MD   500 mg at 09/01/20 1702  . feeding supplement (ENSURE ENLIVE) (ENSURE ENLIVE) liquid 237 mL  237 mL Oral BID BM Antonieta Pert, MD   237 mL at 09/02/20 0755  . hydrOXYzine (ATARAX/VISTARIL) tablet 20 mg  20 mg Oral TID PRN Dahlia Byes C, NP   20 mg at 09/02/20 0800  . ibuprofen (ADVIL) tablet 600 mg  600 mg Oral Q6H PRN Nira Conn A, NP   600 mg at 08/31/20 0625  . melatonin tablet 3 mg  3 mg Oral QHS Armandina Stammer I, NP   3 mg at 09/01/20 2122  . nicotine (NICODERM CQ - dosed in mg/24 hours) 14 mg/24hr patch             Lab Results:  Results for orders placed or performed during the hospital encounter of 08/28/20 (from the past 48 hour(s))  Glucose, capillary     Status: Abnormal   Collection Time: 08/31/20  5:49 PM  Result Value Ref Range   Glucose-Capillary 121 (H) 70 - 99 mg/dL    Comment: Glucose reference range applies only to samples taken after fasting for at least 8 hours.  Glucose, capillary     Status: None   Collection Time: 09/01/20  7:15 AM  Result Value Ref Range   Glucose-Capillary 98 70 - 99 mg/dL    Comment: Glucose reference range applies only to samples taken after fasting for at least 8 hours.  Glucose, capillary     Status: None   Collection Time: 09/01/20 11:44 AM  Result Value Ref Range   Glucose-Capillary 91 70 - 99 mg/dL    Comment: Glucose reference range applies only to samples taken after fasting for at least 8 hours.  Glucose, capillary     Status: None   Collection Time: 09/01/20  4:57 PM  Result Value Ref Range   Glucose-Capillary 73 70 - 99 mg/dL    Comment: Glucose reference range applies only to samples taken after fasting for at least 8 hours.   Glucose, capillary     Status: None   Collection Time: 09/02/20  5:45 AM  Result Value Ref Range   Glucose-Capillary 87 70 - 99 mg/dL    Comment: Glucose reference range applies only to samples taken after fasting for at least 8 hours.   Comment 1 Notify RN    Comment  2 Document in Chart   Glucose, capillary     Status: None   Collection Time: 09/02/20 11:45 AM  Result Value Ref Range   Glucose-Capillary 93 70 - 99 mg/dL    Comment: Glucose reference range applies only to samples taken after fasting for at least 8 hours.   Blood Alcohol level:  Lab Results  Component Value Date   ETH <10 08/26/2020   ETH <10 07/03/2019   Metabolic Disorder Labs: Lab Results  Component Value Date   HGBA1C 5.6 08/29/2020   MPG 114.02 08/29/2020   No results found for: PROLACTIN Lab Results  Component Value Date   CHOL 208 (H) 08/29/2020   TRIG 144 08/29/2020   HDL 48 08/29/2020   CHOLHDL 4.3 08/29/2020   VLDL 29 08/29/2020   LDLCALC 131 (H) 08/29/2020   LDLCALC 151 (H) 08/22/2020   Physical Findings: AIMS:  , ,  ,  ,    CIWA:    COWS:     Musculoskeletal: Strength & Muscle Tone: within normal limits Gait & Station: normal with walker. Patient leans: N/A  Psychiatric Specialty Exam: Physical Exam Vitals and nursing note reviewed.  HENT:     Mouth/Throat:     Pharynx: Oropharynx is clear.  Eyes:     Comments: Some yellowish drainage to left eye: On an antibiotic ophthalmic solution.  Neck:     Comments: Patient presents with unsteady gait & balance, Currently using walker to ambulate.  Taken off 1:1 as staff witness gait is steady.  Patient needs reminder to use her walker. Cardiovascular:     Rate and Rhythm: Normal rate.  Pulmonary:     Effort: Pulmonary effort is normal.  Genitourinary:    Comments: Deferred Musculoskeletal:        General: Normal range of motion.  Skin:    General: Skin is warm and dry.  Neurological:     Mental Status: She is alert and oriented to  person, place, and time.     Review of Systems  Constitutional: Negative for chills, diaphoresis and fever.  HENT: Negative for congestion, rhinorrhea, sneezing and sore throat.   Eyes: Negative for discharge.  Respiratory: Negative for chest tightness and shortness of breath.   Cardiovascular: Negative for chest pain and palpitations.  Gastrointestinal: Negative for diarrhea, nausea and vomiting.  Endocrine: Negative for cold intolerance.  Genitourinary: Negative for difficulty urinating.  Musculoskeletal: Positive for arthralgias, back pain and myalgias.       Patient presents with unsteady gait & balance, Currently using walker to ambulate. Taken off 1:1 observation for fall.  Nursing staff to constantly remind her to use her walker.   Skin: Negative.   Allergic/Immunologic: Negative for environmental allergies and food allergies.       Allergies: NKDA  Neurological: Positive for weakness. Negative for dizziness, tremors, seizures, syncope, facial asymmetry, speech difficulty, light-headedness, numbness and headaches.  Psychiatric/Behavioral: Positive for confusion, decreased concentration, dysphoric mood and sleep disturbance. Negative for agitation, behavioral problems, hallucinations, self-injury and suicidal ideas. The patient is nervous/anxious. The patient is not hyperactive.     Blood pressure 135/87, pulse 86, temperature 98.2 F (36.8 C), temperature source Oral, resp. rate 18, height 5\' 3"  (1.6 m), weight 99.8 kg, SpO2 100 %.Body mass index is 38.97 kg/m.  General Appearance: Disheveled  Eye Contact:  Good  Speech:  Clear and Coherent  Volume:  Normal  Mood:  " I am making little progress, still depressed"  Affect:  Flat and Tearful  Thought  Process:  Coherent and Descriptions of Associations: Intact  Orientation:  Full (Time, Place, and Person)  Thought Content:  Logical, denies any hallucinations, delusions or paranoia  Suicidal Thoughts:  Denies  Homicidal Thoughts:   Denies  Memory:  Immediate;   Good Recent;   Fair Remote;   Fair  Judgement:  Intact  Insight:  Present  Psychomotor Activity:  Normal  Concentration:  Concentration: Fair and Attention Span: Fair  Recall:  [employer] of Knowledge:  Fair  Language:  Good  Akathisia:  Negative  Handed:  Right  AIMS (if indicated):     Assets:  Desire for Improvement Resilience  ADL's:  Impaired  Cognition:  WNL  Sleep:  Number of Hours: 6.25   Treatment Plan Summary: Daily contact with patient to assess and evaluate symptoms and progress in treatment and Medication management.  Continue inpatient hospitalization. Will continue today 09/02/2020  Diagnosis. Bipolar 1 disorder, depressed.  Depression. DisContinue Wellbutrin 75 mg po daily. Start Wellbutrin 150 mg XL, 24hrs po daily for depression Continue Depakote DR 500 mg po Q evenings for mood stabilization  Insomnia. Continue Melatonin to 4 mg po Q hs to her sleep. Anxiety Offer Hydroxyzine 20 mg po tid as needed for anxiety  Continue polysporin ointment to both eyes tid for eye infection. Continue Albuterol inhaler 1-2 puffs Q 6 hrs prn for SOB. Continue Ensure liquid 237 ml bid between meals. DisContinue the 1:1 supervision for safety- Patient stable with her walker.  Encourage group participation. Discharge disposition plan in progress.  Earney Navy, NP, PMHNP 09/02/2020, 2:27 PM

## 2020-09-03 LAB — GLUCOSE, CAPILLARY
Glucose-Capillary: 102 mg/dL — ABNORMAL HIGH (ref 70–99)
Glucose-Capillary: 119 mg/dL — ABNORMAL HIGH (ref 70–99)
Glucose-Capillary: 92 mg/dL (ref 70–99)

## 2020-09-03 NOTE — Plan of Care (Signed)
Nurse discussed anxiety, depression and coping skills with patient.  

## 2020-09-03 NOTE — Tx Team (Signed)
Interdisciplinary Treatment and Diagnostic Plan Update  09/03/2020 Time of Session: 8:10am Cynthia [name] MRN: [id number]  Principal Diagnosis: Bipolar 1 disorder, depressed, moderate (HCC)  Secondary Diagnoses: Principal Problem:   Bipolar 1 disorder, depressed, moderate (HCC) Active Problems:   MDD (major depressive disorder)   Current Medications:  Current Facility-Administered Medications  Medication Dose Route Frequency Provider Last Rate Last Admin  . albuterol (VENTOLIN HFA) 108 (90 Base) MCG/ACT inhaler 1-2 puff  1-2 puff Inhalation Q6H PRN Antonieta Pert, MD      . bacitracin-polymyxin b (ophth) (POLYSPORIN) ophthalmic ointment   Both Eyes TID Armandina Stammer I, NP   Given at 09/03/20 [phone]  . buPROPion (WELLBUTRIN XL) 24 hr tablet 150 mg  150 mg Oral Daily Onuoha, Josephine C, NP   150 mg at 09/03/20 0734  . divalproex (DEPAKOTE) DR tablet 500 mg  500 mg Oral QPM Antonieta Pert, MD   500 mg at 09/02/20 1659  . feeding supplement (ENSURE ENLIVE) (ENSURE ENLIVE) liquid 237 mL  237 mL Oral BID BM Antonieta Pert, MD   237 mL at 09/02/20 1432  . hydrOXYzine (ATARAX/VISTARIL) tablet 20 mg  20 mg Oral TID PRN Dahlia Byes C, NP   20 mg at 09/02/20 0800  . ibuprofen (ADVIL) tablet 600 mg  600 mg Oral Q6H PRN Nira Conn A, NP   600 mg at 08/31/20 0625  . melatonin tablet 3 mg  3 mg Oral QHS Nwoko, Agnes I, NP   3 mg at 09/02/20 2152   PTA Medications: Medications Prior to Admission  Medication Sig Dispense Refill Last Dose  . albuterol (VENTOLIN HFA) 108 (90 Base) MCG/ACT inhaler Inhale 1-2 puffs into the lungs every 4 (four) hours as needed for wheezing or shortness of breath. (Patient not taking: Reported on 08/27/2020)     . ARIPiprazole (ABILIFY) 10 MG tablet Take 10 mg by mouth every morning.     . divalproex (DEPAKOTE) 500 MG DR tablet Take 500 mg by mouth 2 (two) times daily. (Patient not taking: Reported on 08/27/2020)     . furosemide (LASIX) 20 MG tablet Take  1 tablet by mouth once daily (Patient not taking: Reported on 08/27/2020) 90 tablet 0   . hydrochlorothiazide (HYDRODIURIL) 25 MG tablet Take 1 tablet by mouth once daily 90 tablet 0   . LATUDA 40 MG TABS tablet Take 40 mg by mouth at bedtime. (Patient not taking: Reported on 08/27/2020)     . Oxcarbazepine (TRILEPTAL) 300 MG tablet Take 300 mg by mouth 2 (two) times daily.     . potassium chloride SA (KLOR-CON) 20 MEQ tablet Take 1 tablet (20 mEq total) by mouth 2 (two) times daily for 7 days. 14 tablet 0   . QUEtiapine (SEROQUEL XR) 400 MG 24 hr tablet Take 400 mg by mouth at bedtime. (Patient not taking: Reported on 08/27/2020)     . risperiDONE (RISPERDAL) 3 MG tablet Take 6 mg by mouth at bedtime. (Patient not taking: Reported on 08/27/2020)       Patient Stressors:    Patient Strengths:    Treatment Modalities: Medication Management, Group therapy, Case management,  1 to 1 session with clinician, Psychoeducation, Recreational therapy.   Physician Treatment Plan for Primary Diagnosis: Bipolar 1 disorder, depressed, moderate (HCC) Long Term Goal(s): Improvement in symptoms so as ready for discharge Improvement in symptoms so as ready for discharge   Short Term Goals: Ability to identify changes in lifestyle to reduce recurrence of condition will  improve Ability to verbalize feelings will improve Ability to identify and develop effective coping behaviors will improve Ability to maintain clinical measurements within normal limits will improve Compliance with prescribed medications will improve  Medication Management: Evaluate patient's response, side effects, and tolerance of medication regimen.  Therapeutic Interventions: 1 to 1 sessions, Unit Group sessions and Medication administration.  Evaluation of Outcomes: Progressing  Physician Treatment Plan for Secondary Diagnosis: Principal Problem:   Bipolar 1 disorder, depressed, moderate (HCC) Active Problems:   MDD (major depressive  disorder)  Long Term Goal(s): Improvement in symptoms so as ready for discharge Improvement in symptoms so as ready for discharge   Short Term Goals: Ability to identify changes in lifestyle to reduce recurrence of condition will improve Ability to verbalize feelings will improve Ability to identify and develop effective coping behaviors will improve Ability to maintain clinical measurements within normal limits will improve Compliance with prescribed medications will improve     Medication Management: Evaluate patient's response, side effects, and tolerance of medication regimen.  Therapeutic Interventions: 1 to 1 sessions, Unit Group sessions and Medication administration.  Evaluation of Outcomes: Progressing   RN Treatment Plan for Primary Diagnosis: Bipolar 1 disorder, depressed, moderate (HCC) Long Term Goal(s): Knowledge of disease and therapeutic regimen to maintain health will improve  Short Term Goals: Ability to demonstrate self-control, Ability to verbalize feelings will improve, Ability to identify and develop effective coping behaviors will improve and Compliance with prescribed medications will improve  Medication Management: RN will administer medications as ordered by provider, will assess and evaluate patient's response and provide education to patient for prescribed medication. RN will report any adverse and/or side effects to prescribing provider.  Therapeutic Interventions: 1 on 1 counseling sessions, Psychoeducation, Medication administration, Evaluate responses to treatment, Monitor vital signs and CBGs as ordered, Perform/monitor CIWA, COWS, AIMS and Fall Risk screenings as ordered, Perform wound care treatments as ordered.  Evaluation of Outcomes: Progressing   LCSW Treatment Plan for Primary Diagnosis: Bipolar 1 disorder, depressed, moderate (HCC) Long Term Goal(s): Safe transition to appropriate next level of care at discharge, Engage patient in therapeutic  group addressing interpersonal concerns.  Short Term Goals: Engage patient in aftercare planning with referrals and resources, Facilitate acceptance of mental health diagnosis and concerns and Increase skills for wellness and recovery  Therapeutic Interventions: Assess for all discharge needs, 1 to 1 time with Social worker, Explore available resources and support systems, Assess for adequacy in community support network, Educate family and significant other(s) on suicide prevention, Complete Psychosocial Assessment, Interpersonal group therapy.  Evaluation of Outcomes: Progressing   Progress in Treatment: Attending groups: No. Participating in groups: No. Taking medication as prescribed: Yes. Toleration medication: Yes. Family/Significant other contact made: Yes, individual(s) contacted:  daughter Patient understands diagnosis: No. Discussing patient identified problems/goals with staff: Yes. Medical problems stabilized or resolved: No. Denies suicidal/homicidal ideation: Yes. Issues/concerns per patient self-inventory: No. Other: None  New problem(s) identified: No, Describe:  None  New Short Term/Long Term Goal(s):  Patient Goals:  "To get to feeling better"  Discharge Plan or Barriers: SW will continue to assess.   Reason for Continuation of Hospitalization: Depression Medication stabilization  Estimated Length of Stay: 3-5 Days  Attendees: Patient:  09/03/2020 8:10 AM  Physician:  09/03/2020 8:10 AM  Nursing:  09/03/2020 8:10 AM  RN Care Manager: 09/03/2020 8:10 AM  Social Worker: Ruthann Cancer, LCSW 09/03/2020 8:10 AM  Recreational Therapist:  09/03/2020 8:10 AM  Other:  09/03/2020 8:10 AM  Other:  09/03/2020 8:10 AM  Other: 09/03/2020 8:10 AM    Scribe for Treatment Team: Otelia Santee, LCSW 09/03/2020 8:10 AM

## 2020-09-03 NOTE — Progress Notes (Signed)
Adult Psychoeducational Group Note  Date:  09/03/2020 Time:  11:44 PM  Group Topic/Focus:  Wrap-Up Group:   The focus of this group is to help patients review their daily goal of treatment and discuss progress on daily workbooks.  Participation Level:  Minimal  Participation Quality:  Drowsy  Affect:  Flat  Cognitive:  Lacking  Insight: Limited  Engagement in Group:  Improving  Modes of Intervention:  Education and Support  Additional Comments:  Patient attended the group meeting  Judithann Sheen 09/03/2020, 11:44 PM

## 2020-09-03 NOTE — Progress Notes (Signed)
[facility]  09/03/2020 1:52 PM Cynthia [name]  MRN:  [id number] Subjective:  "I'm good."  Ms. Pecola LeisureManess found sitting in the dayroom. She initially states she feels "good" but presents flat, monotone, with minimal, one-word answers and sighing. She later admits to still feeling depressed. She states she has been feeling increasingly depressed over the last several years but unable to identify triggers. She admits that she has not been taking care of herself, eating or sleeping well at home related to the depression. She has history of multiple medication trials but does not feel any past medications have been helpful. She reports appetite is "fair." She had not been eating at home. She ate breakfast this morning but admits to minimal intake at dinner last night. Patient was encouraged to eat some of every meal. She is observed to be ambulating safely with a walker. Denies any episodes of dizziness or lightheadedness. She does report improved sleep, with 6.75 hours recorded overnight. She was provided with educational materials on ECT over the weekend but has not reviewed them. She becomes anxious when discussing this treatment and asks for PRN Vistaril to review written information on ECT. She has been isolating to her room. Denies SI.  From admission H&P:  Patient is a 59 year old female [facility] accompanied by her daughter.She was reported at that time to be desponded. The patient has a history of bipolar disorder as well as bipolar disorder; depression. The chief complaint at that time was "I just do not feel well". During that evaluation collateral information was obtained from the daughter which reported previous suicide attempts.   Principal Problem: Bipolar 1 disorder, depressed, moderate (HCC) Diagnosis: Principal Problem:   Bipolar 1 disorder, depressed, moderate (HCC) Active Problems:   MDD (major  depressive disorder)  Total Time spent with patient: 20 minutes  Past Psychiatric History: See admission H&P  Past Medical History:  Past Medical History:  Diagnosis Date  . Bipolar 1 disorder (HCC)   . Essential hypertension   . GERD (gastroesophageal reflux disease)    History reviewed. No pertinent surgical history. Family History:  Family History  Problem Relation Age of Onset  . Cancer Mother        lung  . Cancer Father        prostate  . Diabetes Paternal Grandfather    Family Psychiatric  History: See admission H&P  Social History:  Social History   Substance and Sexual Activity  Alcohol Use Not Currently   Comment: rarely     Social History   Substance and Sexual Activity  Drug Use Never    Social History   Socioeconomic History  . Marital status: Divorced    Spouse name: Not on file  . Number of children: 3  . Years of education: Not on file  . Highest education level: Not on file  Occupational History  . Not on file  Tobacco Use  . Smoking status: Never Smoker  . Smokeless tobacco: Never Used  Substance and Sexual Activity  . Alcohol use: Not Currently    Comment: rarely  . Drug use: Never  . Sexual activity: Not on file  Other Topics Concern  . Not on file  Social History Narrative  . Not on file   Social Determinants of Health   Financial Resource Strain:   . Difficulty of Paying Living Expenses: Not on file  Food Insecurity:   . Worried About  Running Out of Food in the Last Year: Not on file  . Ran Out of Food in the Last Year: Not on file  Transportation Needs:   . Lack of Transportation (Medical): Not on file  . Lack of Transportation (Non-Medical): Not on file  Physical Activity:   . Days of Exercise per Week: Not on file  . Minutes of Exercise per Session: Not on file  Stress:   . Feeling of Stress : Not on file  Social Connections:   . Frequency of Communication with Friends and Family: Not on file  . Frequency of Social  Gatherings with Friends and Family: Not on file  . Attends Religious Services: Not on file  . Active Member of Clubs or Organizations: Not on file  . Attends Banker Meetings: Not on file  . Marital Status: Not on file   Additional Social History:                         Sleep: Good  Appetite:  Fair  Current Medications: Current Facility-Administered Medications  Medication Dose Route Frequency Provider Last Rate Last Admin  . albuterol (VENTOLIN HFA) 108 (90 Base) MCG/ACT inhaler 1-2 puff  1-2 puff Inhalation Q6H PRN Antonieta Pert, MD      . bacitracin-polymyxin b (ophth) (POLYSPORIN) ophthalmic ointment   Both Eyes TID Armandina Stammer I, NP   Given at 09/03/20 [phone]  . buPROPion (WELLBUTRIN XL) 24 hr tablet 150 mg  150 mg Oral Daily Onuoha, Josephine C, NP   150 mg at 09/03/20 0734  . divalproex (DEPAKOTE) DR tablet 500 mg  500 mg Oral QPM Antonieta Pert, MD   500 mg at 09/02/20 1659  . feeding supplement (ENSURE ENLIVE) (ENSURE ENLIVE) liquid 237 mL  237 mL Oral BID BM Antonieta Pert, MD   237 mL at 09/03/20 1100  . hydrOXYzine (ATARAX/VISTARIL) tablet 20 mg  20 mg Oral TID PRN Dahlia Byes C, NP   20 mg at 09/03/20 1100  . ibuprofen (ADVIL) tablet 600 mg  600 mg Oral Q6H PRN Nira Conn A, NP   600 mg at 08/31/20 0625  . melatonin tablet 3 mg  3 mg Oral QHS Nwoko, Agnes I, NP   3 mg at 09/02/20 2152    Lab Results:  Results for orders placed or performed during the hospital encounter of 08/28/20 (from the past 48 hour(s))  Glucose, capillary     Status: None   Collection Time: 09/01/20  4:57 PM  Result Value Ref Range   Glucose-Capillary 73 70 - 99 mg/dL    Comment: Glucose reference range applies only to samples taken after fasting for at least 8 hours.  Glucose, capillary     Status: None   Collection Time: 09/02/20  5:45 AM  Result Value Ref Range   Glucose-Capillary 87 70 - 99 mg/dL    Comment: Glucose reference range applies only to  samples taken after fasting for at least 8 hours.   Comment 1 Notify RN    Comment 2 Document in Chart   Glucose, capillary     Status: None   Collection Time: 09/02/20 11:45 AM  Result Value Ref Range   Glucose-Capillary 93 70 - 99 mg/dL    Comment: Glucose reference range applies only to samples taken after fasting for at least 8 hours.  Glucose, capillary     Status: None   Collection Time: 09/02/20  4:53 PM  Result  Value Ref Range   Glucose-Capillary 98 70 - 99 mg/dL    Comment: Glucose reference range applies only to samples taken after fasting for at least 8 hours.  Glucose, capillary     Status: Abnormal   Collection Time: 09/03/20  5:53 AM  Result Value Ref Range   Glucose-Capillary 102 (H) 70 - 99 mg/dL    Comment: Glucose reference range applies only to samples taken after fasting for at least 8 hours.   Comment 1 Notify RN    Comment 2 Document in Chart   Glucose, capillary     Status: Abnormal   Collection Time: 09/03/20 11:58 AM  Result Value Ref Range   Glucose-Capillary 119 (H) 70 - 99 mg/dL    Comment: Glucose reference range applies only to samples taken after fasting for at least 8 hours.    Blood Alcohol level:  Lab Results  Component Value Date   ETH <10 08/26/2020   ETH <10 07/03/2019    Metabolic Disorder Labs: Lab Results  Component Value Date   HGBA1C 5.6 08/29/2020   MPG 114.02 08/29/2020   No results found for: PROLACTIN Lab Results  Component Value Date   CHOL 208 (H) 08/29/2020   TRIG 144 08/29/2020   HDL 48 08/29/2020   CHOLHDL 4.3 08/29/2020   VLDL 29 08/29/2020   LDLCALC 131 (H) 08/29/2020   LDLCALC 151 (H) 08/22/2020    Physical Findings: AIMS: Facial and Oral Movements Muscles of Facial Expression: None, normal Lips and Perioral Area: None, normal Jaw: None, normal Tongue: None, normal,Extremity Movements Upper (arms, wrists, hands, fingers): None, normal Lower (legs, knees, ankles, toes): None, normal, Trunk Movements Neck,  shoulders, hips: None, normal, Overall Severity Severity of abnormal movements (highest score from questions above): None, normal Incapacitation due to abnormal movements: None, normal Patient's awareness of abnormal movements (rate only patient's report): No Awareness, Dental Status Current problems with teeth and/or dentures?: No Does patient usually wear dentures?: No  CIWA:    COWS:     Musculoskeletal: Strength & Muscle Tone: decreased Gait & Station: ambulates well with walker Patient leans: N/A  Psychiatric Specialty Exam: Physical Exam Vitals and nursing note reviewed.  Constitutional:      Appearance: She is well-developed.  Cardiovascular:     Rate and Rhythm: Normal rate.  Pulmonary:     Effort: Pulmonary effort is normal.  Neurological:     Mental Status: She is alert and oriented to person, place, and time.     Review of Systems  Constitutional: Negative.   Respiratory: Negative for cough and shortness of breath.   Psychiatric/Behavioral: Positive for decreased concentration and dysphoric mood. Negative for agitation, behavioral problems, confusion, hallucinations, self-injury, sleep disturbance and suicidal ideas. The patient is nervous/anxious. The patient is not hyperactive.     Blood pressure (!) 148/111, pulse 97, temperature 97.6 F (36.4 C), temperature source Oral, resp. rate 18, height 5\' 3"  (1.6 m), weight 99.8 kg, SpO2 100 %.Body mass index is 38.97 kg/m.  General Appearance: Fairly Groomed  Eye Contact:  Fair  Speech:  Slow  Volume:  Decreased  Mood:  Anxious and Depressed  Affect:  Flat  Thought Process:  Coherent  Orientation:  Full (Time, Place, and Person)  Thought Content:  Rumination  Suicidal Thoughts:  No  Homicidal Thoughts:  No  Memory:  Immediate;   Fair Recent;   Fair  Judgement:  Fair  Insight:  Lacking  Psychomotor Activity:  Decreased  Concentration:  Concentration: Fair and  Attention Span: Poor  Recall:  [employer] of  Knowledge:  Fair  Language:  Good  Akathisia:  No  Handed:  Right  AIMS (if indicated):     Assets:  Communication Skills Housing Social Support  ADL's:  Intact  Cognition:  WNL  Sleep:  Number of Hours: 6.75     Treatment Plan Summary: Daily contact with patient to assess and evaluate symptoms and progress in treatment and Medication management   Continue inpatient hospitalization. Consult has been placed to [facility] for ECT, and patient is reviewing information on ECT.  Continue Wellbutrin XL 150 mg PO daily for depression Continue Vistaril 20 mg PO TID PRN anxiety Continue Depakote 500 mg PO QHS for mood instability Continue melatonin 3 mg PO QHS for insomnia Continue Polysporin ophthalmic ointment both eyes TID for eye infection  Patient will participate in the therapeutic group milieu.  Discharge disposition in progress.   Aldean Baker, NP 09/03/2020, 1:52 PM

## 2020-09-03 NOTE — Progress Notes (Signed)
Recreation Therapy Notes  Date: 9.27.21 Time: 0930 Location: 300 [address] Dayroom  Group Topic: Stress Management  Goal Area(s) Addresses:  Patient will identify positive stress management techniques. Patient will identify benefits of using stress management post d/c.  Intervention: Stress Management  Activity:  Body Scan.  LRT played a meditation that focused on taking inventory into how your body is feeling.  The meditation focused on each area of the body individually.  Patients were to listen and follow along as meditation played to engage in activity.    Education:  Stress Management, Discharge Planning.   Education Outcome: Acknowledges Education  Clinical Observations/Feedback: Pt did not attend group session.    Breyden Jeudy, LRT/CTRS         Talor Cheema A 09/03/2020 11:52 AM 

## 2020-09-03 NOTE — Progress Notes (Signed)
D:  Patient self inventory sheet, patient sleeps good, sleep medication helpful.  Good appetite, high energy level, good concentration.  Rated depression 2, denied hopeless, anxiety 5.  Denied withdrawals.  Denied SI.  Denied physical problems.  Denied physical pain.  Goal is attend groups.  Does have discharge plans. A:  Medications administered per MD orders.  Emotional support and encouragement given patient. R:  Denied SI and HI, contracts for safety.  Denied A/V hallucinations.  Safety maintained with 15 minute checks.

## 2020-09-03 NOTE — Progress Notes (Signed)
Pt did not attend wrap-up group   

## 2020-09-03 NOTE — Progress Notes (Signed)
   09/03/20 1945  COVID-19 Daily Checkoff  Have you had a fever (temp > 37.80C/100F)  in the past 24 hours?  No  COVID-19 EXPOSURE  Have you traveled outside the state in the past 14 days? No  Have you been in contact with someone with a confirmed diagnosis of COVID-19 or PUI in the past 14 days without wearing appropriate PPE? No  Have you been living in the same home as a person with confirmed diagnosis of COVID-19 or a PUI (household contact)? No  Have you been diagnosed with COVID-19? No

## 2020-09-03 NOTE — Evaluation (Signed)
Physical Therapy Evaluation Patient Details Name: Cynthia [name] MRN: [id number] DOB: 10-06-1961 Today's Date: 09/03/2020   History of Present Illness  Pt went to the [facility] on 08/26/2020 with her daughter stating her mom was very weak, lethagic and no responding , talking very much. She then was brought to the ED for further assessment with h/o biplor 1 and MDD Major depressive disorder. Once stable she was transferred to [facility] however upon arrival 08/28/2020 pt was not feeling well and fell upon arrival. So sent back to ED for assessment and then back to [facility] same day. Since then she has had a 1:1 sitter for fall risk, very unsteady on her feet and not very alert and participatory with basic mobility or ADLs. She has improven over past few days using RW with 1:1 sitter int eh hallways.  Clinical Impression   Pt was very cooperative during assessment, mobilizing in hallway and room with no AD and no assist. No loss of balance noted and performed balance assessment along with muscle testing. Pt seemed to be doing very well today, We did discuss still the need to work on overall strengthening and balance muscles, home exercise program performed and handout given to patient and nurse. Pt wants to continue this program independent ly at home with no HHPT, and declined suggestion of RW as a back up plan when she may not be having a good day.   No follow up PT at this point.   Access Code: [email or web address] URL: [email or web address] Date: 09/03/2020 Prepared by: [employer]  Exercises Standing Marching - 3 x daily - 7 x weekly - 1 sets - 10 reps Heel Toe Raises with Counter Support - 3 x daily - 7 x weekly - 1 sets - 10 reps Heel Toe Raises with Counter Support - 3 x daily - 7 x weekly - 1 sets - 10 reps Standing Hip Abduction with Counter Support - 3 x daily - 7 x weekly - 1 sets - 10 reps Seated Long Arc Quad - 3 x daily - 7 x weekly - 1 sets - 10 reps     Follow Up Recommendations No PT follow up    Equipment Recommendations  Other (comment) (aksed it pt thought she would benefit from RW when she may not be having a good day., pt declined RW)    Recommendations for Other Services       Precautions / Restrictions Precautions Precautions: Fall Precaution Comments: fell upon arrival at [facility]      Mobility  Bed Mobility               General bed mobility comments: no assessed pt up in day room  Transfers Overall transfer level: Independent               General transfer comment: assessed sit to stand from chair and no armed chair , both independent with no need of UEs for support  Ambulation/Gait Ambulation/Gait assistance: Independent Gait Distance (Feet): 120 Feet Assistive device: None Gait Pattern/deviations: Step-through pattern Gait velocity: moderate . Asked pt to rate her mobility as "great day,. moderate day or poor day" . Today she was having a moderate day.   General Gait Details: Pt ambulated with no AD, does have ER and pronation with Bil ankles and feet wich makes walking and clearnace a little lower at times, but no "shuffling" or scuffing of either foot during assessment and walking today. No antalgic gait noted , not balance  deficits noted as well  Information systems manager [employer] (Stroke Patients Only)       Balance Overall balance assessment: History of Falls;Modified Independent                               Standardized Balance Assessment Standardized Balance Assessment : Berg Balance Test Berg Balance Test Sit to Stand: Able to stand without using hands and stabilize independently Standing Unsupported: Able to stand safely 2 minutes Sitting with Back Unsupported but Feet Supported on Floor or Stool: Able to sit safely and securely 2 minutes Stand to Sit: Sits safely with minimal use of hands Transfers: Able to transfer safely, minor use of  hands Standing Unsupported with Eyes Closed: Able to stand 10 seconds with supervision Standing Ubsupported with Feet Together: Able to place feet together independently and stand 1 minute safely From Standing, Reach Forward with Outstretched Arm: Can reach forward >12 cm safely (5") From Standing Position, Pick up Object from Floor: Able to pick up shoe safely and easily From Standing Position, Turn to Look Behind Over each Shoulder: Looks behind from both sides and weight shifts well Turn 360 Degrees: Able to turn 360 degrees safely one side only in 4 seconds or less Standing Unsupported, Alternately Place Feet on Step/Stool: Able to complete 4 steps without aid or supervision Standing Unsupported, One Foot in Front: Able to take small step independently and hold 30 seconds Standing on One Leg: Able to lift leg independently and hold equal to or more than 3 seconds Total Score: 47         Pertinent Vitals/Pain      Home Living Family/patient expects to be discharged to:: Private residence Living Arrangements: Alone Available Help at Discharge:  (unsure of pt's DC plan and if family or friends able to assist at all (?) Pt did not respond or give much information about this) Type of Home: House Home Access: Stairs to enter Entrance Stairs-Rails: Right Entrance Stairs-Number of Steps: 4-5 Home Layout: One level Home Equipment: None      Prior Function Level of Independence: Independent         Comments: however over past few months daughter states ( per chart) that pt has not been taking care of herself very well at all.     Hand Dominance        Extremity/Trunk Assessment        Lower Extremity Assessment Lower Extremity Assessment: Overall WFL for tasks assessed (assessed LE in seated postion and with functional tasks, basic 4+/5 for all muscle testing.)       Communication   Communication: Other (comment) (very flat affect, with 1 word responses, however did  participate with all asked of her during assessment. Just a very distance gaze.)  Cognition Arousal/Alertness: Awake/alert Behavior During Therapy: WFL for tasks assessed/performed Overall Cognitive Status: Within Functional Limits for tasks assessed                                 General Comments: unclear of patietns cogninitve ability for DC for safety. Pt not with many words during assessment.      General Comments      Exercises     Assessment/Plan    PT Assessment Patent does not need any further PT services (  discussed with pt and pt wises to do exercises on her own and get back to herself)  PT Problem List         PT Treatment Interventions      PT Goals (Current goals can be found in the Care Plan section)  Acute Rehab PT Goals Patient Stated Goal: I want to be able to walk without "this " RW  as she stated when I approached her PT Goal Formulation: All assessment and education complete, DC therapy    Frequency     Barriers to discharge        Co-evaluation               AM-PAC PT "6 Clicks" Mobility  Outcome Measure Help needed turning from your back to your side while in a flat bed without using bedrails?: None Help needed moving from lying on your back to sitting on the side of a flat bed without using bedrails?: None Help needed moving to and from a bed to a chair (including a wheelchair)?: None Help needed standing up from a chair using your arms (e.g., wheelchair or bedside chair)?: None Help needed to walk in hospital room?: None Help needed climbing 3-5 steps with a railing? : A Little 6 Click Score: 23    End of Session   Activity Tolerance: Patient tolerated treatment well Patient left: Other (comment) (hallway with nurse) Nurse Communication: Mobility status PT Visit Diagnosis: Unsteadiness on feet (R26.81)    Time: 1545-1610 PT Time Calculation (min) (ACUTE ONLY): 25 min   Charges:   PT Evaluation $PT Eval Low  Complexity: 1 Low          Cynthia [name], PT, MPT Acute Rehabilitation Services Office: [phone] Pager: [phone] 09/03/2020   Marella Bile 09/03/2020, 4:32 PM

## 2020-09-04 LAB — GLUCOSE, CAPILLARY: Glucose-Capillary: 92 mg/dL (ref 70–99)

## 2020-09-04 MED ORDER — LEVOTHYROXINE SODIUM 100 MCG PO TABS
100.0000 ug | ORAL_TABLET | Freq: Every day | ORAL | Status: DC
  Administered 2020-09-05 – 2020-09-07 (×3): 100 ug via ORAL
  Filled 2020-09-04 (×4): qty 1

## 2020-09-04 MED ORDER — DIVALPROEX SODIUM 500 MG PO DR TAB
500.0000 mg | DELAYED_RELEASE_TABLET | Freq: Two times a day (BID) | ORAL | Status: DC
  Administered 2020-09-04 – 2020-09-07 (×7): 500 mg via ORAL
  Filled 2020-09-04 (×10): qty 1

## 2020-09-04 NOTE — Progress Notes (Signed)
Recreation Therapy Notes  Animal-Assisted Activity (AAA) Program Checklist/Progress Notes Patient Eligibility Criteria Checklist & Daily Group note for Rec Tx Intervention  Date: 9.28.21 Time: 1430 Location: [address]   AAA/T Program Assumption of Risk Form signed by Engineer, production or Parent Legal Guardian  YES   Patient is free of allergies or sever asthma  YES   Patient reports no fear of animals  YES   Patient reports no history of cruelty to animals YES  Patient understands his/her participation is voluntary YES  Patient washes hands before animal contact  YES   Patient washes hands after animal contact  YES   Education: Charity fundraiser, Appropriate Animal Interaction   Education Outcome: Acknowledges understanding/In group clarification offered/Needs additional education.   Clinical Observations/Feedback: Pt did not attend group activity.    Caroll Rancher, LRT/CTRS        Caroll Rancher A 09/04/2020 3:37 PM

## 2020-09-04 NOTE — Progress Notes (Addendum)
Pt reports having a "good" day and feeling "fine." Pt asked to rate her anxiety on a scale of 0-10 (10 being the highest) and instead she said that it has been "okay." She is minimal and doesn't elaborate with her answers. Pt has been walking steadily without her walker tonight. Denies any dizziness or lightheadedness. When asked about how she feels about possible ECT tx, pt said she's "scared." She isn't able to say what she's scared of and denies having any questions regarding ECT. Pt denies SI/HI and AVH. Active listening, reassurance, and support provided. Medications administered as ordered by MD. Q 15 min safety checks continue. Pt's safety has been maintained.   09/03/20 1945  Psych Admission Type (Psych Patients Only)  Admission Status Voluntary  Psychosocial Assessment  Patient Complaints Anxiety;Depression  Eye Contact Avoids;Brief  Facial Expression Flat  Affect Flat  Speech Logical/coherent;Soft;Slow  Interaction Minimal;Forwards little  Motor Activity Slow;Other (Comment) (walking independently now, has walker if needed)  Appearance/Hygiene Unremarkable  Behavior Characteristics Cooperative;Appropriate to situation;Calm;Anxious  Mood Depressed;Anxious;Pleasant  Thought Process  Coherency WDL  Content WDL  Delusions None reported or observed  Perception WDL  Hallucination None reported or observed  Judgment Limited  Confusion None  Danger to Self  Current suicidal ideation? Denies  Danger to Others  Danger to Others None reported or observed

## 2020-09-04 NOTE — Progress Notes (Signed)
  D:  Patient denies SI/HI/AVH. Patient rated anxiety 8/10 and depression 4/10. Pt. Isolative in room. A:  Patient took scheduled medicine.  Support and encouragement provided Routine safety checks conducted every 15 minutes. Patient  Informed to notify staff with any concerns.   R:  Safety maintained.

## 2020-09-04 NOTE — Progress Notes (Signed)
[facility] MD Progress Note  09/04/2020 12:37 PM Cynthia [name]  MRN:  [id number] Subjective: Patient is a 59 year old female with a past psychiatric history significant for bipolar disorder, most recently depressed. She was admitted on 08/29/2020. She was started on Wellbutrin and Depakote at that time.  Objective: Patient is seen and examined. Patient is a 59 year old female with the above-stated past psychiatric history who is seen in follow-up. The main agenda today to discuss with the patient was the process, side effects and objective improvement to be expected with ECT. The patient stated when I first arrived that she was no longer interested in ECT. She stated she was scared of it. I went through the entire process. I discussed side effects and especially the fact that ECT has less side effects and is more efficient and rapid in treating especially bipolar depression then medications are. She still declined to be referred to [facility] for ECT. Her main question today was when she was going to be discharged. She still appears to be depressed, but is making minimal effort on her own part. Short acting Wellbutrin was changed to Wellbutrin XL 2 days ago. She has been on the 150 mg for essentially 2 days. Her Depakote DR dosage had been the same, and we discussed increasing that dosage today. She denied auditory or visual hallucinations. She denied suicidal or homicidal ideation. She denied any side effects to her current medications. Her vital signs are stable, she is afebrile. Review of her laboratories revealed normal electrolytes from 08/28/2020. Her liver function enzymes on 9/19 were normal. Her lipid panel showed an elevated cholesterol at 258, and LDL of 131, and triglycerides of 144. Her CBC from 9/19 was normal. This includes her differential. Her valproic acid level on 9/22 was 41. TSH was 0.258. She does have a history of hypothyroidism, and it does appear that she is slightly  overmedicated. Her EKG from 9/21 showed a sinus tachycardia with a rate of 101, and a QTc interval of 452.  Principal Problem: Bipolar 1 disorder, depressed, moderate (HCC) Diagnosis: Principal Problem:   Bipolar 1 disorder, depressed, moderate (HCC) Active Problems:   MDD (major depressive disorder)  Total Time spent with patient: 20 minutes  Past Psychiatric History: See admission H&P  Past Medical History:  Past Medical History:  Diagnosis Date  . Bipolar 1 disorder (HCC)   . Essential hypertension   . GERD (gastroesophageal reflux disease)    History reviewed. No pertinent surgical history. Family History:  Family History  Problem Relation Age of Onset  . Cancer Mother        lung  . Cancer Father        prostate  . Diabetes Paternal Grandfather    Family Psychiatric  History: See admission H&P Social History:  Social History   Substance and Sexual Activity  Alcohol Use Not Currently   Comment: rarely     Social History   Substance and Sexual Activity  Drug Use Never    Social History   Socioeconomic History  . Marital status: Divorced    Spouse name: Not on file  . Number of children: 3  . Years of education: Not on file  . Highest education level: Not on file  Occupational History  . Not on file  Tobacco Use  . Smoking status: Never Smoker  . Smokeless tobacco: Never Used  Substance and Sexual Activity  . Alcohol use: Not Currently    Comment: rarely  . Drug use:  Never  . Sexual activity: Not on file  Other Topics Concern  . Not on file  Social History Narrative  . Not on file   Social Determinants of Health   Financial Resource Strain:   . Difficulty of Paying Living Expenses: Not on file  Food Insecurity:   . Worried About Programme researcher, broadcasting/film/video in the Last Year: Not on file  . Ran Out of Food in the Last Year: Not on file  Transportation Needs:   . Lack of Transportation (Medical): Not on file  . Lack of Transportation (Non-Medical): Not  on file  Physical Activity:   . Days of Exercise per Week: Not on file  . Minutes of Exercise per Session: Not on file  Stress:   . Feeling of Stress : Not on file  Social Connections:   . Frequency of Communication with Friends and Family: Not on file  . Frequency of Social Gatherings with Friends and Family: Not on file  . Attends Religious Services: Not on file  . Active Member of Clubs or Organizations: Not on file  . Attends Banker Meetings: Not on file  . Marital Status: Not on file   Additional Social History:                         Sleep: Good  Appetite:  Fair  Current Medications: Current Facility-Administered Medications  Medication Dose Route Frequency Provider Last Rate Last Admin  . albuterol (VENTOLIN HFA) 108 (90 Base) MCG/ACT inhaler 1-2 puff  1-2 puff Inhalation Q6H PRN Antonieta Pert, MD      . bacitracin-polymyxin b (ophth) (POLYSPORIN) ophthalmic ointment   Both Eyes TID Armandina Stammer I, NP   Given at 09/04/20 [phone]  . buPROPion (WELLBUTRIN XL) 24 hr tablet 150 mg  150 mg Oral Daily Onuoha, Josephine C, NP   150 mg at 09/04/20 0759  . divalproex (DEPAKOTE) DR tablet 500 mg  500 mg Oral Q12H Aldean Baker, NP   500 mg at 09/04/20 7209  . feeding supplement (ENSURE ENLIVE) (ENSURE ENLIVE) liquid 237 mL  237 mL Oral BID BM Antonieta Pert, MD   237 mL at 09/04/20 0928  . hydrOXYzine (ATARAX/VISTARIL) tablet 20 mg  20 mg Oral TID PRN Dahlia Byes C, NP   20 mg at 09/04/20 0800  . ibuprofen (ADVIL) tablet 600 mg  600 mg Oral Q6H PRN Nira Conn A, NP   600 mg at 08/31/20 0625  . melatonin tablet 3 mg  3 mg Oral QHS Nwoko, Agnes I, NP   3 mg at 09/03/20 2102    Lab Results:  Results for orders placed or performed during the hospital encounter of 08/28/20 (from the past 48 hour(s))  Glucose, capillary     Status: None   Collection Time: 09/02/20  4:53 PM  Result Value Ref Range   Glucose-Capillary 98 70 - 99 mg/dL    Comment:  Glucose reference range applies only to samples taken after fasting for at least 8 hours.  Glucose, capillary     Status: Abnormal   Collection Time: 09/03/20  5:53 AM  Result Value Ref Range   Glucose-Capillary 102 (H) 70 - 99 mg/dL    Comment: Glucose reference range applies only to samples taken after fasting for at least 8 hours.   Comment 1 Notify RN    Comment 2 Document in Chart   Glucose, capillary     Status: Abnormal  Collection Time: 09/03/20 11:58 AM  Result Value Ref Range   Glucose-Capillary 119 (H) 70 - 99 mg/dL    Comment: Glucose reference range applies only to samples taken after fasting for at least 8 hours.  Glucose, capillary     Status: None   Collection Time: 09/03/20  4:56 PM  Result Value Ref Range   Glucose-Capillary 92 70 - 99 mg/dL    Comment: Glucose reference range applies only to samples taken after fasting for at least 8 hours.  Glucose, capillary     Status: None   Collection Time: 09/04/20  6:09 AM  Result Value Ref Range   Glucose-Capillary 92 70 - 99 mg/dL    Comment: Glucose reference range applies only to samples taken after fasting for at least 8 hours.    Blood Alcohol level:  Lab Results  Component Value Date   ETH <10 08/26/2020   ETH <10 07/03/2019    Metabolic Disorder Labs: Lab Results  Component Value Date   HGBA1C 5.6 08/29/2020   MPG 114.02 08/29/2020   No results found for: PROLACTIN Lab Results  Component Value Date   CHOL 208 (H) 08/29/2020   TRIG 144 08/29/2020   HDL 48 08/29/2020   CHOLHDL 4.3 08/29/2020   VLDL 29 08/29/2020   LDLCALC 131 (H) 08/29/2020   LDLCALC 151 (H) 08/22/2020    Physical Findings: AIMS: Facial and Oral Movements Muscles of Facial Expression: None, normal Lips and Perioral Area: None, normal Jaw: None, normal Tongue: None, normal,Extremity Movements Upper (arms, wrists, hands, fingers): None, normal Lower (legs, knees, ankles, toes): None, normal, Trunk Movements Neck, shoulders,  hips: None, normal, Overall Severity Severity of abnormal movements (highest score from questions above): None, normal Incapacitation due to abnormal movements: None, normal Patient's awareness of abnormal movements (rate only patient's report): No Awareness, Dental Status Current problems with teeth and/or dentures?: No Does patient usually wear dentures?: No  CIWA:    COWS:     Musculoskeletal: Strength & Muscle Tone: within normal limits Gait & Station: normal Patient leans: N/A  Psychiatric Specialty Exam: Physical Exam Vitals and nursing note reviewed.  Constitutional:      Appearance: She is obese.  HENT:     Head: Normocephalic and atraumatic.  Pulmonary:     Effort: Pulmonary effort is normal.  Neurological:     General: No focal deficit present.     Mental Status: She is alert and oriented to person, place, and time.     Review of Systems  Blood pressure 123/79, pulse 81, temperature 98.6 F (37 C), temperature source Oral, resp. rate 18, height 5\' 3"  (1.6 m), weight 99.8 kg, SpO2 100 %.Body mass index is 38.97 kg/m.  General Appearance: Casual  Eye Contact:  Minimal  Speech:  Normal Rate  Volume:  Decreased  Mood:  Anxious  Affect:  Congruent  Thought Process:  Coherent and Descriptions of Associations: Circumstantial  Orientation:  Full (Time, Place, and Person)  Thought Content:  Logical  Suicidal Thoughts:  No  Homicidal Thoughts:  No  Memory:  Immediate;   Fair Recent;   Fair Remote;   Fair  Judgement:  Intact  Insight:  Lacking  Psychomotor Activity:  Decreased  Concentration:  Concentration: Fair and Attention Span: Fair  Recall:  of Knowledge:  Fair  Language:  Fair  Akathisia:  Negative  Handed:  Right  AIMS (if indicated):     Assets:  Desire for Improvement Resilience  ADL's:  Intact  Cognition:  WNL  Sleep:  Number of Hours: 6     Treatment Plan Summary: Daily contact with patient to assess and evaluate symptoms and  progress in treatment, Medication management and Plan : Patient is seen and examined. Patient is a 59 year old female with the above-stated past psychiatric history who is seen in follow-up.   Diagnosis: 1. Bipolar disorder, most recently depressed, severe without psychotic features 2. Hypothyroidism 3. Hyperlipidemia 4. COPD 5. Obesity  Pertinent findings on examination today: 1. Patient declines ECT at this time. 2. Patient appears to still be depressed and anxious but denies all symptoms. 3. Her levothyroxine dosage is too high and will be reduced.  Plan: 1. Continue albuterol HFA 1 to 2 puffs every 6 hours as needed wheezing. 2. Continue Wellbutrin XL 150 mg p.o. daily for anxiety and depression. 3. Increase Depakote DR to 500 mg p.o. twice daily for mood stability. 4. Restart levothyroxine at 100 mcg p.o. daily for hypothyroidism. 5. Recommend treatment for hyperlipidemia after discharge. 6. Disposition planning-in progress.  Antonieta PertGreg Lawson Gerianne Simonet, MD 09/04/2020, 12:37 PM

## 2020-09-05 LAB — GLUCOSE, CAPILLARY: Glucose-Capillary: 104 mg/dL — ABNORMAL HIGH (ref 70–99)

## 2020-09-05 MED ORDER — PREGABALIN 25 MG PO CAPS
25.0000 mg | ORAL_CAPSULE | Freq: Three times a day (TID) | ORAL | Status: DC
  Administered 2020-09-05 (×2): 25 mg via ORAL
  Filled 2020-09-05 (×2): qty 1

## 2020-09-05 NOTE — Progress Notes (Signed)
[facility] MD Progress Note  09/05/2020 11:21 AM [name]  MRN:  [id number]  Subjective: Cynthia [name] reports, "My depression is not good. It is the same. I don't think that I have improved at all. I have started a new medicine. I will how that goes, whether it will help me or not".  Objective: Patient is a 59 year old female who originally presented to the [facility] on 08/26/2020 accompanied by her daughter. The patient is a poor historian and the majority of information that is dictated today comes from old records. She was reported at that time to be desponded. The patient has a history of bipolar disorder as well as bipolar disorder; depression. The chief complaint at that time was "I just do not feel well". He was awake and alert x3 at that time. During that evaluation collateral information was obtained from the daughter which reported previous suicide attempts. They did not have a great deal of information at that time. The daughter reported the patient resides alone, and that she had done a "welfare check" on the date of admission.  Cynthia [name] is seen, chart reviewed. The chart findings discussed with the treatment team. She presents alert, oriented x 4. She is visible on the unit. She was sitting down in the day room prior to this follow-up care evaluation. She presents with a flat affect but making good eye contact. She says her depression is not getting any better. She adds that she does not think that she is getting any better. However, she says she is hopeful that she will do better on the new antidepressant medication started 2 days ago hopes & that it will make a difference in improving her mood. She says she is sleeping well at night. Reports improved appetite. However, says she does not really care about group sessions. She says she has hard time being in the group sessions because group gets on her nerve. She currently denies any SHI, AVH, delusional thoughts  or paranoia. She does not appear to be responding to any internal stimuli. [name] is in agreement to continue her current plan of care as already in progress.   (Pe Md's previous follow-up care note): Patient is seen and examined. Patient is a 59 year old female with the above-stated past psychiatric history who is seen in follow-up. The main agenda today to discuss with the patient was the process, side effects and objective improvement to be expected with ECT. The patient stated when I first arrived that she was no longer interested in ECT. She stated she was scared of it. I went through the entire process. I discussed side effects and especially the fact that ECT has less side effects and is more efficient and rapid in treating especially bipolar depression then medications are. She still declined to be referred to [facility] for E[facility]. Her main question today was when she was going to be discharged. She still appears to be depressed, but is making minimal effort on her own part. Short acting Wellbutrin was changed to Wellbutrin XL 2 days ago. She has been on the 150 mg for essentially 2 days. Her Depakote DR dosage had been the same, and we discussed increasing that dosage today. She denied auditory or visual hallucinations. She denied suicidal or homicidal ideation. She denied any side effects to her current medications. Her vital signs are stable, she is afebrile. Review of her laboratories revealed normal electrolytes from 08/28/2020. Her liver function enzymes on 9/19 were normal. Her  lipid panel showed an elevated cholesterol at 258, and LDL of 131, and triglycerides of 144. Her CBC from 9/19 was normal. This includes her differential. Her valproic acid level on 9/22 was 41. TSH was 0.258. She does have a history of hypothyroidism, and it does appear that she is slightly overmedicated. Her EKG from 9/21 showed a sinus tachycardia with a rate of 101, and a QTc interval of  452.  Principal Problem: Bipolar 1 disorder, depressed, moderate (HCC)  Diagnosis: Principal Problem:   Bipolar 1 disorder, depressed, moderate (HCC) Active Problems:   MDD (major depressive disorder)  Total Time spent with patient: 20 minutes  Past Psychiatric History: See admission H&P  Past Medical History:  Past Medical History:  Diagnosis Date  . Bipolar 1 disorder (HCC)   . Essential hypertension   . GERD (gastroesophageal reflux disease)    History reviewed. No pertinent surgical history.  Family History:  Family History  Problem Relation Age of Onset  . Cancer Mother        lung  . Cancer Father        prostate  . Diabetes Paternal Grandfather    Family Psychiatric  History: See admission H&P  Social History:  Social History   Substance and Sexual Activity  Alcohol Use Not Currently   Comment: rarely     Social History   Substance and Sexual Activity  Drug Use Never    Social History   Socioeconomic History  . Marital status: Divorced    Spouse name: Not on file  . Number of children: 3  . Years of education: Not on file  . Highest education level: Not on file  Occupational History  . Not on file  Tobacco Use  . Smoking status: Never Smoker  . Smokeless tobacco: Never Used  Substance and Sexual Activity  . Alcohol use: Not Currently    Comment: rarely  . Drug use: Never  . Sexual activity: Not on file  Other Topics Concern  . Not on file  Social History Narrative  . Not on file   Social Determinants of Health   Financial Resource Strain:   . Difficulty of Paying Living Expenses: Not on file  Food Insecurity:   . Worried About Programme researcher, broadcasting/film/videounning Out of Food in the Last Year: Not on file  . Ran Out of Food in the Last Year: Not on file  Transportation Needs:   . Lack of Transportation (Medical): Not on file  . Lack of Transportation (Non-Medical): Not on file  Physical Activity:   . Days of Exercise per Week: Not on file  . Minutes of Exercise  per Session: Not on file  Stress:   . Feeling of Stress : Not on file  Social Connections:   . Frequency of Communication with Friends and Family: Not on file  . Frequency of Social Gatherings with Friends and Family: Not on file  . Attends Religious Services: Not on file  . Active Member of Clubs or Organizations: Not on file  . Attends BankerClub or Organization Meetings: Not on file  . Marital Status: Not on file   Additional Social History:   Sleep: Good  Appetite:  Good  Current Medications: Current Facility-Administered Medications  Medication Dose Route Frequency Provider Last Rate Last Admin  . albuterol (VENTOLIN HFA) 108 (90 Base) MCG/ACT inhaler 1-2 puff  1-2 puff Inhalation Q6H PRN Antonieta Pertlary, Greg Lawson, MD      . bacitracin-polymyxin b (ophth) (POLYSPORIN) ophthalmic ointment   Both  Eyes TID Armandina Stammer I, NP   Given at 09/05/20 [id number]  . buPROPion (WELLBUTRIN XL) 24 hr tablet 150 mg  150 mg Oral Daily Onuoha, Josephine C, NP   150 mg at 09/05/20 0758  . divalproex (DEPAKOTE) DR tablet 500 mg  500 mg Oral Q12H Aldean Baker, NP   500 mg at 09/05/20 0758  . feeding supplement (ENSURE ENLIVE) (ENSURE ENLIVE) liquid 237 mL  237 mL Oral BID BM Antonieta Pert, MD   237 mL at 09/05/20 0921  . hydrOXYzine (ATARAX/VISTARIL) tablet 20 mg  20 mg Oral TID PRN Dahlia Byes C, NP   20 mg at 09/05/20 0800  . ibuprofen (ADVIL) tablet 600 mg  600 mg Oral Q6H PRN Nira Conn A, NP   600 mg at 08/31/20 0625  . levothyroxine (SYNTHROID) tablet 100 mcg  100 mcg Oral Q0600 Antonieta Pert, MD   100 mcg at 09/05/20 0603  . melatonin tablet 3 mg  3 mg Oral QHS Zahava Quant I, NP   3 mg at 09/04/20 2220  . pregabalin (LYRICA) capsule 25 mg  25 mg Oral TID Antonieta Pert, MD   25 mg at 09/05/20 0920   Lab Results:  Results for orders placed or performed during the hospital encounter of 08/28/20 (from the past 48 hour(s))  Glucose, capillary     Status: Abnormal   Collection Time:  09/03/20 11:58 AM  Result Value Ref Range   Glucose-Capillary 119 (H) 70 - 99 mg/dL    Comment: Glucose reference range applies only to samples taken after fasting for at least 8 hours.  Glucose, capillary     Status: None   Collection Time: 09/03/20  4:56 PM  Result Value Ref Range   Glucose-Capillary 92 70 - 99 mg/dL    Comment: Glucose reference range applies only to samples taken after fasting for at least 8 hours.  Glucose, capillary     Status: None   Collection Time: 09/04/20  6:09 AM  Result Value Ref Range   Glucose-Capillary 92 70 - 99 mg/dL    Comment: Glucose reference range applies only to samples taken after fasting for at least 8 hours.  Glucose, capillary     Status: Abnormal   Collection Time: 09/05/20  5:50 AM  Result Value Ref Range   Glucose-Capillary 104 (H) 70 - 99 mg/dL    Comment: Glucose reference range applies only to samples taken after fasting for at least 8 hours.   Comment 1 Notify RN    Comment 2 Document in Chart     Blood Alcohol level:  Lab Results  Component Value Date   ETH <10 08/26/2020   ETH <10 07/03/2019   Metabolic Disorder Labs: Lab Results  Component Value Date   HGBA1C 5.6 08/29/2020   MPG 114.02 08/29/2020   No results found for: PROLACTIN Lab Results  Component Value Date   CHOL 208 (H) 08/29/2020   TRIG 144 08/29/2020   HDL 48 08/29/2020   CHOLHDL 4.3 08/29/2020   VLDL 29 08/29/2020   LDLCALC 131 (H) 08/29/2020   LDLCALC 151 (H) 08/22/2020   Physical Findings: AIMS: Facial and Oral Movements Muscles of Facial Expression: None, normal Lips and Perioral Area: None, normal Jaw: None, normal Tongue: None, normal,Extremity Movements Upper (arms, wrists, hands, fingers): None, normal Lower (legs, knees, ankles, toes): None, normal, Trunk Movements Neck, shoulders, hips: None, normal, Overall Severity Severity of abnormal movements (highest score from questions above): None, normal Incapacitation due  to abnormal  movements: None, normal Patient's awareness of abnormal movements (rate only patient's report): No Awareness, Dental Status Current problems with teeth and/or dentures?: No Does patient usually wear dentures?: No  CIWA:    COWS:     Musculoskeletal: Strength & Muscle Tone: within normal limits Gait & Station: normal Patient leans: N/A  Psychiatric Specialty Exam: Physical Exam Vitals and nursing note reviewed.  Constitutional:      Appearance: She is obese.  HENT:     Head: Normocephalic and atraumatic.     Nose: Nose normal.     Mouth/Throat:     Pharynx: Oropharynx is clear.  Eyes:     Pupils: Pupils are equal, round, and reactive to light.  Cardiovascular:     Rate and Rhythm: Normal rate.     Pulses: Normal pulses.  Pulmonary:     Effort: Pulmonary effort is normal.  Genitourinary:    Comments: Deferred Musculoskeletal:        General: Normal range of motion.     Cervical back: Normal range of motion.  Skin:    General: Skin is warm and dry.  Neurological:     General: No focal deficit present.     Mental Status: She is alert and oriented to person, place, and time.     Review of Systems  Constitutional: Negative for chills, diaphoresis and fever.  HENT: Negative for congestion, rhinorrhea, sneezing and sore throat.   Eyes: Negative for itching.  Respiratory: Negative for cough, chest tightness and wheezing.   Cardiovascular: Negative for chest pain and palpitations.  Gastrointestinal: Negative for diarrhea, nausea and vomiting.  Endocrine: Negative for cold intolerance.  Genitourinary: Negative for difficulty urinating.  Musculoskeletal: Negative for arthralgias and myalgias.  Skin: Negative.   Allergic/Immunologic: Negative for environmental allergies and food allergies.       Allergies: NKDA  Neurological: Negative for dizziness, tremors, seizures, syncope, facial asymmetry, speech difficulty, weakness, light-headedness, numbness and headaches.   Psychiatric/Behavioral: Positive for dysphoric mood. Negative for agitation, behavioral problems, confusion, decreased concentration, hallucinations, self-injury, sleep disturbance and suicidal ideas. The patient is nervous/anxious. The patient is not hyperactive.     Blood pressure 119/78, pulse 92, temperature 98.4 F (36.9 C), temperature source Oral, resp. rate 18, height 5\' 3"  (1.6 m), weight 99.8 kg, SpO2 100 %.Body mass index is 38.97 kg/m.  General Appearance: Casual  Eye Contact:  Minimal  Speech:  Normal Rate  Volume:  Decreased  Mood:  Anxious and Depressed,   Affect:  Congruent  Thought Process:  Coherent and Descriptions of Associations: Circumstantial  Orientation:  Full (Time, Place, and Person)  Thought Content:  Logical  Suicidal Thoughts:  No  Homicidal Thoughts:  No  Memory:  Immediate;   Fair Recent;   Fair Remote;   Fair  Judgement:  Intact  Insight:  Lacking  Psychomotor Activity:  Decreased  Concentration:  Concentration: Fair and Attention Span: Fair  Recall:  of Knowledge:  Fair  Language:  Fair  Akathisia:  Negative  Handed:  Right  AIMS (if indicated):     Assets:  Desire for Improvement Resilience  ADL's:  Intact  Cognition:  WNL  Sleep:  Number of Hours: 6.5   Treatment Plan Summary: Daily contact with patient to assess and evaluate symptoms and progress in treatment, Medication management and Plan : Patient is seen and examined. Patient is a 59 year old female with the above-stated past psychiatric history who is seen in follow-up.   Continue inpatient  hospitalization. Will continue today 09/05/2020 plan as below except where it is noted.  Diagnosis: 1. Bipolar disorder, most recently depressed, severe without psychotic features 2. Hypothyroidism 3. Hyperlipidemia 4. COPD 5. Obesity  Pertinent findings on examination today: 1. Patient declines ECT at this time. 2. Patient appears to still be depressed and anxious but denies  all symptoms. 3. Her levothyroxine dosage is too high and will be reduced.  Plan: 1. Continue albuterol HFA 1 to 2 puffs every 6 hours as needed wheezing. 2. Continue Wellbutrin XL 150 mg p.o. daily for anxiety and depression. 3. Continue Depakote DR 500 mg p.o. twice daily for mood stability. 4. Continue levothyroxine 100 mcg p.o. daily for hypothyroidism. 5. Recommend treatment for hyperlipidemia after discharge. 6. Disposition planning-in progress. 7. Encourage group participation  Armandina Stammer, NP, PMHNP, FNP-BC 09/05/2020, 11:21 AMPatient ID: Sanjuan Dame, female   DOB: 06/10/1961, 59 y.o.   MRN: [id number]

## 2020-09-05 NOTE — Progress Notes (Signed)
Adult Psychoeducational Group Note  Date:  09/05/2020 Time:  11:34 PM  Group Topic/Focus:  Wrap-Up Group:   The focus of this group is to help patients review their daily goal of treatment and discuss progress on daily workbooks.  Participation Level:  Minimal  Participation Quality:  Drowsy  Affect:  Labile  Cognitive:  Appropriate  Insight: Improving  Engagement in Group:  Lacking  Modes of Intervention:  Education and Support  Additional Comments:  Patient was less involved in the group.  Cindi Ghazarian  Lanice Shirts 09/05/2020, 11:34 PM

## 2020-09-05 NOTE — Progress Notes (Signed)
   09/04/20 2219  COVID-19 Daily Checkoff  Have you had a fever (temp > 37.80C/100F)  in the past 24 hours?  No  COVID-19 EXPOSURE  Have you traveled outside the state in the past 14 days? No  Have you been in contact with someone with a confirmed diagnosis of COVID-19 or PUI in the past 14 days without wearing appropriate PPE? No  Have you been living in the same home as a person with confirmed diagnosis of COVID-19 or a PUI (household contact)? No  Have you been diagnosed with COVID-19? No

## 2020-09-05 NOTE — Progress Notes (Signed)
   09/04/20 2219  Psych Admission Type (Psych Patients Only)  Admission Status Voluntary  Psychosocial Assessment  Patient Complaints Anxiety;Depression;Sadness  Eye Contact Avoids;Brief  Facial Expression Flat  Affect Depressed;Flat  Speech Logical/coherent;Soft;Slow  Interaction Forwards little;Minimal;Guarded;Isolative  Motor Activity Slow;Other (Comment) (steady, walks independently)  Appearance/Hygiene Unremarkable  Behavior Characteristics Cooperative;Appropriate to situation;Anxious;Calm  Mood Depressed;Anxious;Sad  Thought Process  Coherency WDL  Content WDL  Delusions None reported or observed  Perception WDL  Hallucination None reported or observed  Judgment Limited  Confusion None  Danger to Self  Current suicidal ideation? Denies  Danger to Others  Danger to Others None reported or observed

## 2020-09-05 NOTE — Progress Notes (Signed)
   09/05/20 2103  COVID-19 Daily Checkoff  Have you had a fever (temp > 37.80C/100F)  in the past 24 hours?  No  COVID-19 EXPOSURE  Have you traveled outside the state in the past 14 days? No  Have you been in contact with someone with a confirmed diagnosis of COVID-19 or PUI in the past 14 days without wearing appropriate PPE? No  Have you been living in the same home as a person with confirmed diagnosis of COVID-19 or a PUI (household contact)? No  Have you been diagnosed with COVID-19? No

## 2020-09-05 NOTE — Progress Notes (Signed)
Adult Psychoeducational Group Note  Date:  09/05/2020 Time:  1:26 PM  Group Topic/Focus:  Goals Group:   The focus of this group is to help patients establish daily goals to achieve during treatment and discuss how the patient can incorporate goal setting into their daily lives to aide in recovery.  Participation Level:  Active  Participation Quality:  Appropriate  Affect:  Appropriate  Cognitive:  Alert and Appropriate  Insight: Appropriate, Good and Improving  Engagement in Group:  Engaged  Modes of Intervention:  Discussion  Additional Comments:  Pt attended group and participated in discussion.  Isaic Syler R Nickalas Mccarrick 09/05/2020, 1:26 PM

## 2020-09-05 NOTE — Progress Notes (Signed)
Pt denies SI/HI/AVH.  Pt continues to endorse depression and anxiety.  RN administered medications per MD orders. RN  provided encouragement and reassurance.  Pt took medications without incident.  Pt said that she does not want to complete ECT treatment.  Pt could not express why she did not want to get ECT treatment.  All pt would say is "ECT doesn't sound like it's for me." RN will continue to monitor and provide assistance as needed.

## 2020-09-06 LAB — GLUCOSE, CAPILLARY
Glucose-Capillary: 105 mg/dL — ABNORMAL HIGH (ref 70–99)
Glucose-Capillary: 102 mg/dL — ABNORMAL HIGH (ref 70–99)

## 2020-09-06 NOTE — Progress Notes (Signed)
Pt denies having any anxiety or depression tonight. Pt was asked to rate her mood on a scale of 0-10 (10 being the best). Pt rated her mood a 9 even though she continues to have a flat affect. She believes her medications are working and plans to return back home where she will be living alone. Pt has been isolative to her room, but occasionally does walk and come up to the nurses station. Encouraged pt to participate in groups. Pt denies SI/HI and AVH. Active listening, reassurance, and support provided. Medications administered as ordered by MD. Q 15 min safety checks continue. Pt's safety has been maintained. Pt has been steady on her feet, hasn't needed her walker. She denies any dizziness, lightheadedness, or pain.   09/05/20 2103  Psych Admission Type (Psych Patients Only)  Admission Status Voluntary  Psychosocial Assessment  Patient Complaints None  Eye Contact Brief  Facial Expression Flat  Affect Flat  Speech Logical/coherent  Interaction Forwards little;Minimal;Guarded;Isolative  Motor Activity Slow  Appearance/Hygiene Unremarkable  Behavior Characteristics Cooperative;Calm;Anxious  Mood Depressed;Anxious;Pleasant  Thought Process  Coherency WDL  Content WDL  Delusions None reported or observed  Perception WDL  Hallucination None reported or observed  Judgment Poor  Confusion None  Danger to Self  Current suicidal ideation? Denies  Danger to Others  Danger to Others None reported or observed

## 2020-09-06 NOTE — Progress Notes (Signed)
[facility] Note  09/06/2020 11:23 AM Sanjuan DameKimberly H Scaff  MRN:  [id number]  Subjective: Cynthia [name] reports, "I'm okay today. I don't have any complaints. I think the new medicine is helping. I slept well last night. When am I going to be discharged?"   Objective: Patient is a 59 year old female [facility] accompanied by her daughter. The patient is a poor historian and the majority of information that is dictated today comes from old records. She was reported at that time to be desponded. The patient has a history of bipolar disorder as well as bipolar disorder; depression. The chief complaint at that time was "I just do not feel well". He was awake and alert x3 at that time. During that evaluation collateral information was obtained from the daughter which reported previous suicide attempts. They did not have a great deal of information at that time. The daughter reported the patient resides alone, and that she had done a "welfare check" on the date of admission.  Cynthia [name] is seen, chart reviewed. The chart findings discussed with the treatment team. She presents alert, oriented x 4. She is visible on the unit. She is lying down in her bed, however, did get up to talk with this provider during this follow-up care assessment. She presents with a restricted but improved affect. She is making a good eye contact. She says she is doing okay today. Denies any complaints. Cynthia [name] says she is doing well on the new antidepressant. Denies any side effects. She says she is sleeping well at night. Reports improved appetite. However, says she does not really care about group sessions, but she is making efforts to attend the sessions. Cynthia [name] is asking when she can be discharged from this hospital. She believes that she is not going to improve any further than what is at this point. She currently denies any SHI, AVH, delusional thoughts or  paranoia. She does not appear to be responding to any internal stimuli. Cynthia [name] is in agreement to continue her current plan of care as already in progress.   (Pe Md's previous follow-up care note): Patient is seen and examined. Patient is a 59 year old female with the above-stated past psychiatric history who is seen in follow-up. The main agenda today to discuss with the patient was the process, side effects and objective improvement to be expected with ECT. The patient stated when I first arrived that she was no longer interested in ECT. She stated she was scared of it. I went through the entire process. I discussed side effects and especially the fact that ECT has less side effects and is more efficient and rapid in treating especially bipolar depression then medications are. She still declined to be referred to [facility]. Her main question today was when she was going to be discharged. She still appears to be depressed, but is making minimal effort on her own part. Short acting Wellbutrin was changed to Wellbutrin XL 2 days ago. She has been on the 150 mg for essentially 2 days. Her Depakote DR dosage had been the same, and we discussed increasing that dosage today. She denied auditory or visual hallucinations. She denied suicidal or homicidal ideation. She denied any side effects to her current medications. Her vital signs are stable, she is afebrile. Review of her laboratories revealed normal electrolytes from 08/28/2020. Her liver function enzymes on 9/19 were normal. Her lipid panel showed an elevated cholesterol at  258, and LDL of 131, and triglycerides of 144. Her CBC from 9/19 was normal. This includes her differential. Her valproic acid level on 9/22 was 41. TSH was 0.258. She does have a history of hypothyroidism, and it does appear that she is slightly overmedicated. Her EKG from 9/21 showed a sinus tachycardia with a rate of 101, and a QTc interval of  452.  Principal Problem: Bipolar 1 disorder, depressed, moderate (HCC)  Diagnosis: Principal Problem:   Bipolar 1 disorder, depressed, moderate (HCC) Active Problems:   MDD (major depressive disorder)  Total Time spent with patient: 15 minutes  Past Psychiatric History: See admission H&P  Past Medical History:  Past Medical History:  Diagnosis Date  . Bipolar 1 disorder (HCC)   . Essential hypertension   . GERD (gastroesophageal reflux disease)    History reviewed. No pertinent surgical history.  Family History:  Family History  Problem Relation Age of Onset  . Cancer Mother        lung  . Cancer Father        prostate  . Diabetes Paternal Grandfather    Family Psychiatric  History: See admission H&P  Social History:  Social History   Substance and Sexual Activity  Alcohol Use Not Currently   Comment: rarely     Social History   Substance and Sexual Activity  Drug Use Never    Social History   Socioeconomic History  . Marital status: Divorced    Spouse name: Not on file  . Number of children: 3  . Years of education: Not on file  . Highest education level: Not on file  Occupational History  . Not on file  Tobacco Use  . Smoking status: Never Smoker  . Smokeless tobacco: Never Used  Substance and Sexual Activity  . Alcohol use: Not Currently    Comment: rarely  . Drug use: Never  . Sexual activity: Not on file  Other Topics Concern  . Not on file  Social History Narrative  . Not on file   Social Determinants of Health   Financial Resource Strain:   . Difficulty of Paying Living Expenses: Not on file  Food Insecurity:   . Worried About Programme researcher, broadcasting/film/video in the Last Year: Not on file  . Ran Out of Food in the Last Year: Not on file  Transportation Needs:   . Lack of Transportation (Medical): Not on file  . Lack of Transportation (Non-Medical): Not on file  Physical Activity:   . Days of Exercise per Week: Not on file  . Minutes of Exercise  per Session: Not on file  Stress:   . Feeling of Stress : Not on file  Social Connections:   . Frequency of Communication with Friends and Family: Not on file  . Frequency of Social Gatherings with Friends and Family: Not on file  . Attends Religious Services: Not on file  . Active Member of Clubs or Organizations: Not on file  . Attends Banker Meetings: Not on file  . Marital Status: Not on file   Additional Social History:   Sleep: Good  Appetite:  Good  Current Medications: Current Facility-Administered Medications  Medication Dose Route Frequency Provider Last Rate Last Admin  . albuterol (VENTOLIN HFA) 108 (90 Base) MCG/ACT inhaler 1-2 puff  1-2 puff Inhalation Q6H PRN Antonieta Pert, MD      . buPROPion (WELLBUTRIN XL) 24 hr tablet 150 mg  150 mg Oral Daily Onuoha, Josephine C,  NP   150 mg at 09/06/20 0900  . divalproex (DEPAKOTE) DR tablet 500 mg  500 mg Oral Q12H Aldean Baker, NP   500 mg at 09/06/20 0900  . feeding supplement (ENSURE ENLIVE) (ENSURE ENLIVE) liquid 237 mL  237 mL Oral BID BM Antonieta Pert, MD   237 mL at 09/06/20 0935  . hydrOXYzine (ATARAX/VISTARIL) tablet 20 mg  20 mg Oral TID PRN Dahlia Byes C, NP   20 mg at 09/05/20 0800  . ibuprofen (ADVIL) tablet 600 mg  600 mg Oral Q6H PRN Nira Conn A, NP   600 mg at 08/31/20 0625  . levothyroxine (SYNTHROID) tablet 100 mcg  100 mcg Oral Q0600 Antonieta Pert, MD   100 mcg at 09/06/20 [phone]  . melatonin tablet 3 mg  3 mg Oral QHS Armandina Stammer I, NP   3 mg at 09/05/20 2103   Lab Results:  Results for orders placed or performed during the hospital encounter of 08/28/20 (from the past 48 hour(s))  Glucose, capillary     Status: Abnormal   Collection Time: 09/05/20  5:50 AM  Result Value Ref Range   Glucose-Capillary 104 (H) 70 - 99 mg/dL    Comment: Glucose reference range applies only to samples taken after fasting for at least 8 hours.   Comment 1 Notify RN    Comment 2 Document  in Chart   Glucose, capillary     Status: Abnormal   Collection Time: 09/06/20  6:11 AM  Result Value Ref Range   Glucose-Capillary 105 (H) 70 - 99 mg/dL    Comment: Glucose reference range applies only to samples taken after fasting for at least 8 hours.    Blood Alcohol level:  Lab Results  Component Value Date   ETH <10 08/26/2020   ETH <10 07/03/2019   Metabolic Disorder Labs: Lab Results  Component Value Date   HGBA1C 5.6 08/29/2020   MPG 114.02 08/29/2020   No results found for: PROLACTIN Lab Results  Component Value Date   CHOL 208 (H) 08/29/2020   TRIG 144 08/29/2020   HDL 48 08/29/2020   CHOLHDL 4.3 08/29/2020   VLDL 29 08/29/2020   LDLCALC 131 (H) 08/29/2020   LDLCALC 151 (H) 08/22/2020   Physical Findings: AIMS: Facial and Oral Movements Muscles of Facial Expression: None, normal Lips and Perioral Area: None, normal Jaw: None, normal Tongue: None, normal,Extremity Movements Upper (arms, wrists, hands, fingers): None, normal Lower (legs, knees, ankles, toes): None, normal, Trunk Movements Neck, shoulders, hips: None, normal, Overall Severity Severity of abnormal movements (highest score from questions above): None, normal Incapacitation due to abnormal movements: None, normal Patient's awareness of abnormal movements (rate only patient's report): No Awareness, Dental Status Current problems with teeth and/or dentures?: No Does patient usually wear dentures?: No  CIWA:    COWS:     Musculoskeletal: Strength & Muscle Tone: within normal limits Gait & Station: normal Patient leans: N/A  Psychiatric Specialty Exam: Physical Exam Vitals and nursing note reviewed.  Constitutional:      Appearance: She is obese.  HENT:     Head: Normocephalic and atraumatic.     Nose: Nose normal.     Mouth/Throat:     Pharynx: Oropharynx is clear.  Eyes:     Pupils: Pupils are equal, round, and reactive to light.  Cardiovascular:     Rate and Rhythm: Normal rate.      Pulses: Normal pulses.  Pulmonary:     Effort: Pulmonary  effort is normal.  Genitourinary:    Comments: Deferred Musculoskeletal:        General: Normal range of motion.     Cervical back: Normal range of motion.  Skin:    General: Skin is warm and dry.  Neurological:     General: No focal deficit present.     Mental Status: She is alert and oriented to person, place, and time.     Review of Systems  Constitutional: Negative for chills, diaphoresis and fever.  HENT: Negative for congestion, rhinorrhea, sneezing and sore throat.   Eyes: Negative for itching.  Respiratory: Negative for cough, chest tightness and wheezing.   Cardiovascular: Negative for chest pain and palpitations.  Gastrointestinal: Negative for diarrhea, nausea and vomiting.  Endocrine: Negative for cold intolerance.  Genitourinary: Negative for difficulty urinating.  Musculoskeletal: Negative for arthralgias and myalgias.  Skin: Negative.   Allergic/Immunologic: Negative for environmental allergies and food allergies.       Allergies: NKDA  Neurological: Negative for dizziness, tremors, seizures, syncope, facial asymmetry, speech difficulty, weakness, light-headedness, numbness and headaches.  Psychiatric/Behavioral: Positive for dysphoric mood. Negative for agitation, behavioral problems, confusion, decreased concentration, hallucinations, self-injury, sleep disturbance and suicidal ideas. The patient is nervous/anxious. The patient is not hyperactive.     Blood pressure 118/76, pulse 84, temperature 98.7 F (37.1 C), temperature source Oral, resp. rate 18, height  (1.6 m), weight 99.8 kg, SpO2 100 %.Body mass index is 38.97 kg/m.  General Appearance: Casual  Eye Contact:  Minimal  Speech:  Normal Rate  Volume:  Decreased  Mood:  "I feel okay this morning".,   Affect:  Congruent and Improving  Thought Process:  Coherent and Descriptions of Associations: Intact  Orientation:  Full (Time, Place, and  Person)  Thought Content:  Logical  Suicidal Thoughts:  No  Homicidal Thoughts:  No  Memory:  Immediate;   Fair Recent;   Fair Remote;   Fair  Judgement:  Intact  Insight:  Lacking  Psychomotor Activity:  Decreased  Concentration:  Concentration: Fair and Attention Span: Fair  Recall:  [employer] of Knowledge:  Fair  Language:  Fair  Akathisia:  Negative  Handed:  Right  AIMS (if indicated):     Assets:  Desire for Improvement Resilience  ADL's:  Intact  Cognition:  WNL  Sleep:  Number of Hours: 6.5   Treatment Plan Summary: Daily contact with patient to assess and evaluate symptoms and progress in treatment, Medication management and Plan : Patient is seen and examined. Patient is a 59 year old female with the above-stated past psychiatric history who is seen in follow-up.   Continue inpatient hospitalization. Will continue today 09/06/2020 plan as below except where it is noted.  Diagnosis: 1. Bipolar disorder, most recently depressed, severe without psychotic features 2. Hypothyroidism 3. Hyperlipidemia 4. COPD 5. Obesity  Pertinent findings on examination today: 1. Patient declines ECT at this time. 2. Patient appears to still be depressed and anxious but denies all symptoms. 3. Her levothyroxine dosage is too high and will be reduced.  Plan: 1. Continue albuterol HFA 1 to 2 puffs every 6 hours as needed wheezing. 2. Continue Wellbutrin XL 150 mg p.o. daily for anxiety and depression. 3. Continue Depakote DR 500 mg p.o. twice daily for mood stability. 4. Continue levothyroxine 100 mcg p.o. daily for hypothyroidism. 5. Recommend treatment for hyperlipidemia after discharge. 6. Disposition planning-in progress. 7. Encourage group participation  Armandina Stammer, NP, PMHNP, FNP-BC 09/06/2020, 11:23 AMPatient ID: Gabriel Rainwater  Furgeson, female   DOB: [birth year], 59 y.o.   MRN: [id number] Patient ID: VENOLA CASTELLO, female   DOB: 04-02-1961, 59 y.o.   MRN: [id number]

## 2020-09-06 NOTE — Progress Notes (Signed)
D:  Patient's self inventory sheet, patient sleeps good, sleep medication helpful.  Good appetite, normal energy level, good concentration.  Rated depression 2, hopeless denied, rated anxiety 5.  Denied withdrawals.  Denied SI.  Denied physical problems.  Denied physical pain.  Goal is attend groups.  No discharge plans. A:  Medications administered per MD orders.  Emotional support and encouragement given patient. R:  Patient denied SI and HI, contracts for safety.  Denied A/V hallucinations.  Safety maintained with 15 minute checks.

## 2020-09-06 NOTE — Progress Notes (Signed)
Patient shared in group that she went outside for fresh air and had a good talk with her daughter today. Her goal for tomorrow is to attend more of the groups.

## 2020-09-07 LAB — GLUCOSE, CAPILLARY: Glucose-Capillary: 100 mg/dL — ABNORMAL HIGH (ref 70–99)

## 2020-09-07 MED ORDER — MELATONIN 3 MG PO TABS
3.0000 mg | ORAL_TABLET | Freq: Every day | ORAL | 0 refills | Status: DC
Start: 2020-09-07 — End: 2022-06-26

## 2020-09-07 MED ORDER — HYDROXYZINE HCL 10 MG PO TABS
20.0000 mg | ORAL_TABLET | Freq: Three times a day (TID) | ORAL | 0 refills | Status: DC | PRN
Start: 2020-09-07 — End: 2022-06-26

## 2020-09-07 MED ORDER — IBUPROFEN 600 MG PO TABS
600.0000 mg | ORAL_TABLET | Freq: Four times a day (QID) | ORAL | 0 refills | Status: DC | PRN
Start: 2020-09-07 — End: 2022-05-12

## 2020-09-07 MED ORDER — LEVOTHYROXINE SODIUM 100 MCG PO TABS
100.0000 ug | ORAL_TABLET | Freq: Every day | ORAL | 0 refills | Status: DC
Start: 2020-09-08 — End: 2020-10-22

## 2020-09-07 MED ORDER — DIVALPROEX SODIUM 500 MG PO DR TAB: 500.0000 mg | DELAYED_RELEASE_TABLET | Freq: Two times a day (BID) | ORAL | 0 refills | Status: DC

## 2020-09-07 MED ORDER — BUPROPION HCL ER (XL) 150 MG PO TB24
150.0000 mg | ORAL_TABLET | Freq: Every day | ORAL | 0 refills | Status: DC
Start: 2020-09-08 — End: 2024-04-04

## 2020-09-07 NOTE — Progress Notes (Signed)
   09/06/20 2102  COVID-19 Daily Checkoff  Have you had a fever (temp > 37.80C/100F)  in the past 24 hours?  No  COVID-19 EXPOSURE  Have you traveled outside the state in the past 14 days? No  Have you been in contact with someone with a confirmed diagnosis of COVID-19 or PUI in the past 14 days without wearing appropriate PPE? No  Have you been living in the same home as a person with confirmed diagnosis of COVID-19 or a PUI (household contact)? No  Have you been diagnosed with COVID-19? No

## 2020-09-07 NOTE — Progress Notes (Signed)
  [facility] Adult Case Management Discharge Plan :  Will you be returning to the same living situation after discharge:  Yes,  to home At discharge, do you have transportation home?: Yes,  daughter to pick up Do you have the ability to pay for your medications: Yes,  has insurance   Release of information consent forms completed and in the chart;  Patient's signature needed at discharge.  Patient to Follow up at:  Follow-up Information    Pl[facility], [facility] Follow up on 09/12/2020.   Why: Appointment scheduled for 09/12/2020 at 2:30pm.  Contact information: [address] [place] [address] [phone]        [facility] Follow up.   Why: Appointment is on 09/13/2020 at 2:00pm. Please arrive at 1:45pm to complete paperwork.  Contact information: [address], [place], [place] [address] Phone: [phone]              Next level of care provider has access to [facility] Link:no  Safety Planning and Suicide Prevention discussed: Yes,  with daughter  Have you used any form of tobacco in the last 30 days? (Cigarettes, Smokeless Tobacco, Cigars, and/or Pipes): No  Has patient been referred to the Quitline?: N/A patient is not a smoker  Patient has been referred for addiction treatment: N/A  Otelia Santee, LCSW 09/07/2020, 10:38 AM

## 2020-09-07 NOTE — [facility] Suicide Risk Assessment (Signed)
[facility] Discharge Suicide Risk Assessment   Principal Problem: Bipolar 1 disorder, depressed, moderate (HCC) Discharge Diagnoses: Principal Problem:   Bipolar 1 disorder, depressed, moderate (HCC) Active Problems:   MDD (major depressive disorder)   Total Time spent with patient: 20 minutes  Musculoskeletal: Strength & Muscle Tone: within normal limits Gait & Station: normal Patient leans: N/A  Psychiatric Specialty Exam: Review of Systems  All other systems reviewed and are negative.   Blood pressure (!) 143/123, pulse (!) 103, temperature 98.6 F (37 C), temperature source Oral, resp. rate 18, height 5\' 3"  (1.6 m), weight 99.8 kg, SpO2 100 %.Body mass index is 38.97 kg/m.  General Appearance: Casual  Eye Contact::  Fair  Speech:  Normal Rate409  Volume:  Normal  Mood:  Anxious  Affect:  Congruent  Thought Process:  Coherent and Descriptions of Associations: Intact  Orientation:  Full (Time, Place, and Person)  Thought Content:  Logical  Suicidal Thoughts:  No  Homicidal Thoughts:  No  Memory:  Immediate;   Fair Recent;   Fair Remote;   Fair  Judgement:  Intact  Insight:  Fair  Psychomotor Activity:  Increased  Concentration:  Fair  Recall:  [phone] of Knowledge:Fair  Language: Fair  Akathisia:  Negative  Handed:  Right  AIMS (if indicated):     Assets:  Desire for Improvement Housing Resilience Social Support  Sleep:  Number of Hours: 6.75  Cognition: WNL  ADL's:  Intact   Mental Status Per Nursing Assessment::   On Admission:     Demographic Factors:  Divorced or widowed, Caucasian, Low socioeconomic status, Living alone and Unemployed  Loss Factors: Decline in physical health  Historical Factors: Impulsivity  Risk Reduction Factors:   Positive social support  Continued Clinical Symptoms:  Bipolar Disorder:   Depressive phase Depression:   Impulsivity  Cognitive Features That Contribute To Risk:  Thought constriction (tunnel vision)     Suicide Risk:  Minimal: No identifiable suicidal ideation.  Patients presenting with no risk factors but with morbid ruminations; may be classified as minimal risk based on the severity of the depressive symptoms   Follow-up Information    Pl[facility], [facility] Follow up on 09/12/2020.   Why: Appointment scheduled for 09/12/2020 at 2:30pm.  Contact information: [address] [place] [phone]        [facility] Follow up.   Why: Appointment is on 09/13/2020 at 2:00pm. Please arrive at 1:45pm to complete paperwork.  Contact information: [address], [place], [place] Phone: [phone]              Plan Of Care/Follow-up recommendations:  Activity:  ad lib  [phone], MD 09/07/2020, 9:06 AM

## 2020-09-07 NOTE — Progress Notes (Signed)
Recreation Therapy Notes  Date:  10.1.21 Time: 0930 Location: [address] Dayroom  Group Topic: Stress Management  Goal Area(s) Addresses:  Patient will identify positive stress management techniques. Patient will identify benefits of using stress management post d/c.  Intervention: Stress Management    Activity:  Guided Imagery.  LRT read a script that took patients on a journey to the beach to relax by the calming, peaceful waves.  Patients were to listen and follow along as the script was read to engage in the activity.    Education:  Stress Management, Discharge Planning.   Education Outcome: Acknowledges Education  Clinical Observations/Feedback: Pt did not attend group activity.    Caroll Rancher, LRT/CTRS         Lillia Abed, Sigourney Portillo A 09/07/2020 10:05 AM

## 2020-09-07 NOTE — Progress Notes (Signed)
Pt reports feeling ready for discharge. Plans on returning home and taking care of herself independently. Denies any anxiety or depression tonight. Pt remains steady on her feet and no longer uses her walker. She denies any dizziness or lightheadedness. Pt was seen in the dayroom last night and participated in group. Reports poor appetite. She said she ate less 100% of her breakfast, but less than 50% of her lunch and dinner. Pt offered snacks and po fluids continue to be encouraged. Pt denies SI/HI and AVH. Active listening, reassurance, and support provided. Medications administered as ordered by MD. Q 15 min safety checks continue. Pt's safety has been maintained.    09/06/20 2102  Psych Admission Type (Psych Patients Only)  Admission Status Voluntary  Psychosocial Assessment  Patient Complaints None  Eye Contact Brief  Facial Expression Flat  Affect Flat  Speech Logical/coherent  Interaction Assertive  Motor Activity Slow  Appearance/Hygiene Unremarkable  Behavior Characteristics Cooperative;Anxious;Calm  Mood Depressed;Anxious  Thought Process  Coherency WDL  Content WDL  Delusions None reported or observed  Perception WDL  Hallucination None reported or observed  Judgment Poor  Confusion None  Danger to Self  Current suicidal ideation? Denies  Danger to Others  Danger to Others None reported or observed

## 2020-09-07 NOTE — Progress Notes (Signed)
Discharge Note:  Patient denies SI/HI AVH at this time. Discharge instructions, AVS, prescriptions and transition record gone over with patient. Patient agrees to comply with medication management, follow-up visit, and outpatient therapy. Patient belongings returned to patient. Patient questions and concerns addressed and answered.  Patient ambulatory off unit.  Patient discharged to home with daughter.

## 2020-09-07 NOTE — Discharge Summary (Signed)
Physician Discharge Summary Note  Patient:  Cynthia [name] is an 59 y.o., female MRN:  [id number] DOB:  [birth year] Patient phone:  [phone] (home)  Patient address:   [place] [address],  Total Time spent with patient: Greater than 30 minutes  Date of Admission:  08/28/2020  Date of Discharge: 09-07-20  Reason for Admission: Worsening symptoms of Bipolar depression.  Principal Problem: Bipolar 1 disorder, depressed, moderate ([place])  Discharge Diagnoses: Principal Problem:   Bipolar 1 disorder, depressed, moderate ([place]) Active Problems:   MDD (major depressive disorder)  Past Psychiatric History: Bipolar 1 disorder, depressed.  Past Medical History:  Past Medical History:  Diagnosis Date  . Bipolar 1 disorder ([place])   . Essential hypertension   . GERD (gastroesophageal reflux disease)    History reviewed. No pertinent surgical history.  Family History:  Family History  Problem Relation Age of Onset  . Cancer Mother        lung  . Cancer Father        prostate  . Diabetes Paternal Grandfather    Family Psychiatric  History: See H&P  Social History:  Social History   Substance and Sexual Activity  Alcohol Use Not Currently   Comment: rarely     Social History   Substance and Sexual Activity  Drug Use Never    Social History   Socioeconomic History  . Marital status: Divorced    Spouse name: Not on file  . Number of children: 3  . Years of education: Not on file  . Highest education level: Not on file  Occupational History  . Not on file  Tobacco Use  . Smoking status: Never Smoker  . Smokeless tobacco: Never Used  Substance and Sexual Activity  . Alcohol use: Not Currently    Comment: rarely  . Drug use: Never  . Sexual activity: Not on file  Other Topics Concern  . Not on file  Social History Narrative  . Not on file   Social Determinants of Health   Financial Resource Strain:   . Difficulty of Paying  Living Expenses: Not on file  Food Insecurity:   . Worried About Charity fundraiser in the Last Year: Not on file  . Ran Out of Food in the Last Year: Not on file  Transportation Needs:   . Lack of Transportation (Medical): Not on file  . Lack of Transportation (Non-Medical): Not on file  Physical Activity:   . Days of Exercise per Week: Not on file  . Minutes of Exercise per Session: Not on file  Stress:   . Feeling of Stress : Not on file  Social Connections:   . Frequency of Communication with Friends and Family: Not on file  . Frequency of Social Gatherings with Friends and Family: Not on file  . Attends Religious Services: Not on file  . Active Member of Clubs or Organizations: Not on file  . Attends Archivist Meetings: Not on file  . Marital Status: Not on file   Hospital Course: (Per Md's admission SRA notes):  Patient is a 59 year old female who originally presented to the [facility] on 08/26/2020 accompanied by her daughter. The patient is a poor historian and the majority of information that is dictated today comes from old records. She was reported at that time to be desponded. The patient has a history of major depressive disorder as well as bipolar disorder. The chief complaint  at that time was "I just do not feel well". He was awake and alert x3 at that time. During that evaluation collateral information was obtained from the daughter which reported previous suicide attempts. They did not have a great deal of information at that time. The daughter reported the patient resides alone, and that she had done a "welfare check" on the date of admission. The patient was then transferred to the [facility] for medical clearance. The patient is followed by a nurse practitioner but again there is very little history from the patient. They were able to find out that the patient stated that she was not sleeping well, and was  having depressive and manic symptoms for the last 6 months. The daughter reported that she was not able to function was not washing, not eating and not taking care of herself. During the medical work-up she was found to be hypokalemic, but did not show any evidence of anything that would require an acute medical hospitalization. This was supplemented and repeated, and her level was at 2.9. She was transferred to the psychiatric hospital for evaluation and stabilization. Unfortunately shortly after she arrived on the hospital grounds she had a fall and was sent back to the emergency department. At that time her EKG revealed multiple PVCs. She had a CT scan of the head done that revealed mild cerebral atrophy but no intracranial abnormality. She was sent back to our hospital for evaluation and stabilization. We do not have access to the nurse practitioner's notes. The last note that we have in the chart from the family medicine office was on 08/22/2020. He has a past medical history significant for hypertension, hyperlipidemia and GERD. According to the family medicine note she receives Trileptal 300 mg p.o. twice daily which was started 1 week prior to this evaluation. She had apparently failed Depakote, Abilify as well as Risperdal. Her other medications at that time included an albuterol inhaler, Lasix, hydrochlorothiazide, Abilify, Depakote, imipramine, meclizine and Risperdal. It is unclear if she was taking 3 antipsychotics at one time. At least by the note itself it appears as though all the antipsychotics have been stopped, and her only medication was the Trileptal 300 mg p.o. twice daily. The patient was seen in the [facility] emergency department on 07/03/2019. She appeared to be manic at that time. It looks as though she had been transferred to old [place] for psychiatric care. We do not have access to those records. The only notation in the chart with regard to previous medications was  an audiology note from 04/28/2016. At that time the patient was taking citalopram, Wellbutrin SR and hydrochlorothiazide. She was admitted to the hospital for evaluation and stabilization. Review of the PMP database given her multiple complaints of pain revealed no evidence of any benzodiazepines or opiates have not been prescribed to her in that record.  After the above admission evaluation, Brittanyann was recommended for mood stabilization treatments. The medication regimen for her presenting symptoms were discussed & initiated with her consent. She received, stabilized & was discharged on the medications as listed below on her discharge medication lists. And during the course of this hospitalization, Keasia was recommended & evaluated by a physical therapist due to presentation of generalized weakness, weak gait/balance & risks for fall. She was also placed on a brief 1:1 supervision for safety & was provided with a walker to aid her mobility to prevent falls. These were later discontinued after patient presented with improved gait/balance. She  was enrolled & reluctantly participated in the group counseling sessions being offered & held on this unit. She learned coping skills. She presented other significant health issues that requires treatment & or monitoring. She was treated & discharged on the medications for those health issues. She tolerated her treatment regimen without any adverse effects or reactions reported.  Keauna's symptoms responded well to her treatment regimen. This is evidenced by her reports of improved mood & presentation of improved affect. Patient has met the maximum benefit of her hospitalization. She is currently mentally & medically stable to continue mental health care & medication management on an outpatient basis as noted below. She is provided with all the necessary information needed to make this appointment without problems.   During the course of her hospitalization, the  15-minute checks were adequate to ensure Desani's safety.  Patient did not display any dangerous, violent or suicidal behavior on the unit.  She interacted with patients & staff appropriately. Her medications were addressed & adjusted to meet her needs. She was recommended for outpatient follow-up care & medication management upon discharge to assure continuity of care.  At the time of discharge, patient is not reporting any acute suicidal/homicidal ideations. She feels more confident about her self-care & in managing her mental health issues moving forward. She currently denies any new issues or concerns. Education and supportive counseling provided throughout her hospital stay & upon discharge.  Today upon her discharge evaluation with the attending psychiatrist, Xaviera shares she is doing well. She denies any other specific concerns. She is sleeping well. Her appetite is good. She denies other physical complaints. She denies AH/VH, delusional thoughts or paranoia. She feels that her medications have been helpful & is in agreement to continue her current treatment regimen. She was able to engage in safety planning including plan to return to [facility] or contact emergency services if she feels unable to maintain her own safety or the safety of others. Pt had no further questions, comments, or concerns. She left [facility] with all personal belongings in no apparent distress. Transportation per daughter.  Physical Findings: AIMS: Facial and Oral Movements Muscles of Facial Expression: None, normal Lips and Perioral Area: None, normal Jaw: None, normal Tongue: None, normal,Extremity Movements Upper (arms, wrists, hands, fingers): None, normal Lower (legs, knees, ankles, toes): None, normal, Trunk Movements Neck, shoulders, hips: None, normal, Overall Severity Severity of abnormal movements (highest score from questions above): None, normal Incapacitation due to abnormal movements: None, normal Patient's  awareness of abnormal movements (rate only patient's report): No Awareness, Dental Status Current problems with teeth and/or dentures?: No Does patient usually wear dentures?: No  CIWA:    COWS:     Musculoskeletal: Strength & Muscle Tone: within normal limits Gait & Station: normal Patient leans: N/A  Psychiatric Specialty Exam: Physical Exam Vitals and nursing note reviewed.  HENT:     Head: Normocephalic.     Nose: Nose normal.     Mouth/Throat:     Pharynx: Oropharynx is clear.  Eyes:     Pupils: Pupils are equal, round, and reactive to light.  Cardiovascular:     Rate and Rhythm: Normal rate.     Pulses: Normal pulses.  Pulmonary:     Effort: Pulmonary effort is normal.  Genitourinary:    Comments: Deferred Musculoskeletal:        General: Normal range of motion.     Cervical back: Normal range of motion.  Skin:    General: Skin is warm and  dry.  Neurological:     General: No focal deficit present.     Mental Status: She is alert and oriented to person, place, and time. Mental status is at baseline.     Review of Systems  Constitutional: Negative for chills, diaphoresis and fever.  HENT: Negative for congestion, rhinorrhea, sneezing and sore throat.   Eyes: Negative for discharge.  Respiratory: Negative for cough, chest tightness, shortness of breath and wheezing.   Cardiovascular: Negative for chest pain and palpitations.  Gastrointestinal: Negative for diarrhea, nausea and vomiting.  Endocrine: Negative for cold intolerance.  Genitourinary: Negative for difficulty urinating.  Musculoskeletal: Negative for arthralgias and myalgias.  Skin: Negative.   Allergic/Immunologic: Negative for environmental allergies and food allergies.       Allergies: NKDA  Neurological: Negative for dizziness, tremors, seizures, syncope, facial asymmetry, speech difficulty, weakness, light-headedness, numbness and headaches.  Psychiatric/Behavioral: Positive for dysphoric mood and  sleep disturbance (Stabilized with medication prior to discharge). Negative for agitation, behavioral problems, confusion, decreased concentration, hallucinations, self-injury and suicidal ideas. The patient is not nervous/anxious (Stable upon discharge) and is not hyperactive.     Blood pressure (!) 143/123, pulse (!) 103, temperature 98.6 F (37 C), temperature source Oral, resp. rate 18, height _0  (1.6 m), weight 99.8 kg, SpO2 100 %.Body mass index is 38.97 kg/m.  See Md's discharge SRA  Sleep:  Number of Hours: 6.75   Have you used any form of tobacco in the last 30 days? (Cigarettes, Smokeless Tobacco, Cigars, and/or Pipes): No  Has this patient used any form of tobacco in the last 30 days? (Cigarettes, Smokeless Tobacco, Cigars, and/or Pipes): N/A  Blood Alcohol level:  Lab Results  Component Value Date   ETH <10 08/26/2020   ETH <10 69/48/5462   Metabolic Disorder Labs:  Lab Results  Component Value Date   HGBA1C 5.6 08/29/2020   MPG 114.02 08/29/2020   No results found for: PROLACTIN Lab Results  Component Value Date   CHOL 208 (H) 08/29/2020   TRIG 144 08/29/2020   HDL 48 08/29/2020   CHOLHDL 4.3 08/29/2020   VLDL 29 08/29/2020   LDLCALC 131 (H) 08/29/2020   LDLCALC 151 (H) 08/22/2020   See Psychiatric Specialty Exam and Suicide Risk Assessment completed by Attending Physician prior to discharge.  Discharge destination:  Home  Is patient on multiple antipsychotic therapies at discharge:  No   Has Patient had three or more failed trials of antipsychotic monotherapy by history:  No  Recommended Plan for Multiple Antipsychotic Therapies: NA  Allergies as of 09/07/2020   No Known Allergies     Medication List    STOP taking these medications   ARIPiprazole 10 MG tablet Commonly known as: ABILIFY   furosemide 20 MG tablet Commonly known as: LASIX   hydrochlorothiazide 25 MG tablet Commonly known as: HYDRODIURIL   Latuda 40 MG Tabs tablet Generic  drug: lurasidone   Oxcarbazepine 300 MG tablet Commonly known as: TRILEPTAL   potassium chloride SA 20 MEQ tablet Commonly known as: KLOR-CON   QUEtiapine 400 MG 24 hr tablet Commonly known as: SEROQUEL XR   risperiDONE 3 MG tablet Commonly known as: RISPERDAL     TAKE these medications     Indication  albuterol 108 (90 Base) MCG/ACT inhaler Commonly known as: VENTOLIN HFA Inhale 1-2 puffs into the lungs every 4 (four) hours as needed for wheezing or shortness of breath.  Indication: Asthma, Chronic Obstructive Lung Disease   buPROPion 150 MG 24 hr  tablet Commonly known as: WELLBUTRIN XL Take 1 tablet (150 mg total) by mouth daily. For depression Start taking on: September 08, 2020  Indication: Major Depressive Disorder   divalproex 500 MG DR tablet Commonly known as: DEPAKOTE Take 1 tablet (500 mg total) by mouth every 12 (twelve) hours. For mood stabilization What changed:   when to take this  additional instructions  Indication: Mood stabilization   hydrOXYzine 10 MG tablet Commonly known as: ATARAX/VISTARIL Take 2 tablets (20 mg total) by mouth 3 (three) times daily as needed for anxiety.  Indication: Feeling Anxious   ibuprofen 600 MG tablet Commonly known as: ADVIL Take 1 tablet (600 mg total) by mouth every 6 (six) hours as needed for mild pain or moderate pain.  Indication: Fever, Pain   levothyroxine 100 MCG tablet Commonly known as: SYNTHROID Take 1 tablet (100 mcg total) by mouth daily at 6 (six) AM. For thyroid hormone replacement Start taking on: September 08, 2020  Indication: Underactive Thyroid   melatonin 3 MG Tabs tablet Take 1 tablet (3 mg total) by mouth at bedtime. For sleep  Indication: Trouble Sleeping       Follow-up Information    [facility] Follow up on 09/12/2020.   Why: Appointment scheduled for 09/12/2020 at 2:30pm.  Contact information: [place]  [address] [phone]        [facility] Follow up.   Why: Appointment is on 09/13/2020 at 2:00pm. Please arrive at 1:45pm to complete paperwork.  Contact information: [address], [place], [place] [address] Phone: [phone]             Follow-up recommendations: Activity:  As tolerated Diet: As recommended by your primary care doctor. Keep all scheduled follow-up appointments as recommended.   Comments: Prescriptions given at discharge.  Patient agreeable to plan.  Given opportunity to ask questions.  Appears to feel comfortable with discharge denies any current suicidal or homicidal thought. Patient is also instructed prior to discharge to: Take all medications as prescribed by his/her mental healthcare provider. Report any adverse effects and or reactions from the medicines to his/her outpatient provider promptly. Patient has been instructed & cautioned: To not engage in alcohol and or illegal drug use while on prescription medicines. In the event of worsening symptoms, patient is instructed to call the crisis hotline, 911 and or go to the nearest ED for appropriate evaluation and treatment of symptoms. To follow-up with his/her primary care provider for your other medical issues, concerns and or health care needs.  Signed: Lindell Spar, NP, PMHNP, FNP-BC 09/07/2020, 10:25 AM

## 2020-09-07 NOTE — Tx Team (Signed)
Interdisciplinary Treatment and Diagnostic Plan Update  09/07/2020 Time of Session: 8:10am Cynthia [name] MRN: [id number]  Principal Diagnosis: Bipolar 1 disorder, depressed, moderate (HCC)  Secondary Diagnoses: Principal Problem:   Bipolar 1 disorder, depressed, moderate (HCC) Active Problems:   MDD (major depressive disorder)   Current Medications:  Current Facility-Administered Medications  Medication Dose Route Frequency Provider Last Rate Last Admin  . albuterol (VENTOLIN HFA) 108 (90 Base) MCG/ACT inhaler 1-2 puff  1-2 puff Inhalation Q6H PRN Antonieta Pert, MD      . buPROPion (WELLBUTRIN XL) 24 hr tablet 150 mg  150 mg Oral Daily Onuoha, Josephine C, NP   150 mg at 09/07/20 0810  . divalproex (DEPAKOTE) DR tablet 500 mg  500 mg Oral Q12H Aldean Baker, NP   500 mg at 09/07/20 0810  . feeding supplement (ENSURE ENLIVE) (ENSURE ENLIVE) liquid 237 mL  237 mL Oral BID BM Antonieta Pert, MD   237 mL at 09/07/20 1035  . hydrOXYzine (ATARAX/VISTARIL) tablet 20 mg  20 mg Oral TID PRN Dahlia Byes C, NP   20 mg at 09/07/20 0810  . ibuprofen (ADVIL) tablet 600 mg  600 mg Oral Q6H PRN Nira Conn A, NP   600 mg at 08/31/20 0625  . levothyroxine (SYNTHROID) tablet 100 mcg  100 mcg Oral Q0600 Antonieta Pert, MD   100 mcg at 09/07/20 0610  . melatonin tablet 3 mg  3 mg Oral QHS Nwoko, Agnes I, NP   3 mg at 09/06/20 2102   PTA Medications: Medications Prior to Admission  Medication Sig Dispense Refill Last Dose  . albuterol (VENTOLIN HFA) 108 (90 Base) MCG/ACT inhaler Inhale 1-2 puffs into the lungs every 4 (four) hours as needed for wheezing or shortness of breath. (Patient not taking: Reported on 08/27/2020)     . ARIPiprazole (ABILIFY) 10 MG tablet Take 10 mg by mouth every morning.     . divalproex (DEPAKOTE) 500 MG DR tablet Take 500 mg by mouth 2 (two) times daily. (Patient not taking: Reported on 08/27/2020)     . furosemide (LASIX) 20 MG tablet Take 1 tablet by  mouth once daily (Patient not taking: Reported on 08/27/2020) 90 tablet 0   . hydrochlorothiazide (HYDRODIURIL) 25 MG tablet Take 1 tablet by mouth once daily 90 tablet 0   . LATUDA 40 MG TABS tablet Take 40 mg by mouth at bedtime. (Patient not taking: Reported on 08/27/2020)     . Oxcarbazepine (TRILEPTAL) 300 MG tablet Take 300 mg by mouth 2 (two) times daily.     . potassium chloride SA (KLOR-CON) 20 MEQ tablet Take 1 tablet (20 mEq total) by mouth 2 (two) times daily for 7 days. 14 tablet 0   . QUEtiapine (SEROQUEL XR) 400 MG 24 hr tablet Take 400 mg by mouth at bedtime. (Patient not taking: Reported on 08/27/2020)     . risperiDONE (RISPERDAL) 3 MG tablet Take 6 mg by mouth at bedtime. (Patient not taking: Reported on 08/27/2020)       Patient Stressors:    Patient Strengths:    Treatment Modalities: Medication Management, Group therapy, Case management,  1 to 1 session with clinician, Psychoeducation, Recreational therapy.   Physician Treatment Plan for Primary Diagnosis: Bipolar 1 disorder, depressed, moderate (HCC) Long Term Goal(s): Improvement in symptoms so as ready for discharge Improvement in symptoms so as ready for discharge   Short Term Goals: Ability to identify changes in lifestyle to reduce recurrence of condition will  improve Ability to verbalize feelings will improve Ability to identify and develop effective coping behaviors will improve Ability to maintain clinical measurements within normal limits will improve Compliance with prescribed medications will improve  Medication Management: Evaluate patient's response, side effects, and tolerance of medication regimen.  Therapeutic Interventions: 1 to 1 sessions, Unit Group sessions and Medication administration.  Evaluation of Outcomes: Adequate for Discharge  Physician Treatment Plan for Secondary Diagnosis: Principal Problem:   Bipolar 1 disorder, depressed, moderate (HCC) Active Problems:   MDD (major depressive  disorder)  Long Term Goal(s): Improvement in symptoms so as ready for discharge Improvement in symptoms so as ready for discharge   Short Term Goals: Ability to identify changes in lifestyle to reduce recurrence of condition will improve Ability to verbalize feelings will improve Ability to identify and develop effective coping behaviors will improve Ability to maintain clinical measurements within normal limits will improve Compliance with prescribed medications will improve     Medication Management: Evaluate patient's response, side effects, and tolerance of medication regimen.  Therapeutic Interventions: 1 to 1 sessions, Unit Group sessions and Medication administration.  Evaluation of Outcomes: Adequate for Discharge   RN Treatment Plan for Primary Diagnosis: Bipolar 1 disorder, depressed, moderate (HCC) Long Term Goal(s): Knowledge of disease and therapeutic regimen to maintain health will improve  Short Term Goals: Ability to demonstrate self-control, Ability to verbalize feelings will improve, Ability to identify and develop effective coping behaviors will improve and Compliance with prescribed medications will improve  Medication Management: RN will administer medications as ordered by provider, will assess and evaluate patient's response and provide education to patient for prescribed medication. RN will report any adverse and/or side effects to prescribing provider.  Therapeutic Interventions: 1 on 1 counseling sessions, Psychoeducation, Medication administration, Evaluate responses to treatment, Monitor vital signs and CBGs as ordered, Perform/monitor CIWA, COWS, AIMS and Fall Risk screenings as ordered, Perform wound care treatments as ordered.  Evaluation of Outcomes: Adequate for Discharge   LCSW Treatment Plan for Primary Diagnosis: Bipolar 1 disorder, depressed, moderate (HCC) Long Term Goal(s): Safe transition to appropriate next level of care at discharge, Engage  patient in therapeutic group addressing interpersonal concerns.  Short Term Goals: Engage patient in aftercare planning with referrals and resources, Facilitate acceptance of mental health diagnosis and concerns and Increase skills for wellness and recovery  Therapeutic Interventions: Assess for all discharge needs, 1 to 1 time with Social worker, Explore available resources and support systems, Assess for adequacy in community support network, Educate family and significant other(s) on suicide prevention, Complete Psychosocial Assessment, Interpersonal group therapy.  Evaluation of Outcomes: Adequate for Discharge   Progress in Treatment: Attending groups: Yes. and No. Participating in groups: No. Taking medication as prescribed: Yes. Toleration medication: Yes. Family/Significant other contact made: Yes, individual(s) contacted:  daughter Patient understands diagnosis: No. Discussing patient identified problems/goals with staff: Yes. Medical problems stabilized or resolved: No. Denies suicidal/homicidal ideation: Yes. Issues/concerns per patient self-inventory: No. Other: None  New problem(s) identified: No, Describe:  None  New Short Term/Long Term Goal(s):  Patient Goals:  "To get to feeling better"  Discharge Plan or Barriers: Patient is to return home and is to follow up with outpatient providers.  Reason for Continuation of Hospitalization: Depression Medication stabilization  Estimated Length of Stay: Adequate for discharge  Attendees: Patient:  09/07/2020 11:20 AM  Physician:  09/07/2020 11:20 AM  Nursing:  09/07/2020 11:20 AM  RN Care Manager: 09/07/2020 11:20 AM  Social Worker: Ruthann Cancer, LCSW  09/07/2020 11:20 AM  Recreational Therapist:  09/07/2020 11:20 AM  Other:  09/07/2020 11:20 AM  Other:  09/07/2020 11:20 AM  Other: 09/07/2020 11:20 AM    Scribe for Treatment Team: Otelia Santee, LCSW 09/07/2020 11:20 AM

## 2020-09-12 DIAGNOSIS — R69 Illness, unspecified: Secondary | ICD-10-CM | POA: Diagnosis not present

## 2020-09-13 ENCOUNTER — Other Ambulatory Visit: Payer: Self-pay

## 2020-09-13 ENCOUNTER — Ambulatory Visit (INDEPENDENT_AMBULATORY_CARE_PROVIDER_SITE_OTHER): Payer: Medicare HMO | Admitting: Family Medicine

## 2020-09-13 ENCOUNTER — Encounter: Payer: Self-pay | Admitting: Family Medicine

## 2020-09-13 VITALS — BP 118/80 | HR 95 | Temp 97.3°F | Ht 63.0 in | Wt 211.0 lb

## 2020-09-13 DIAGNOSIS — R69 Illness, unspecified: Secondary | ICD-10-CM | POA: Diagnosis not present

## 2020-09-13 DIAGNOSIS — F314 Bipolar disorder, current episode depressed, severe, without psychotic features: Secondary | ICD-10-CM

## 2020-09-13 DIAGNOSIS — R7989 Other specified abnormal findings of blood chemistry: Secondary | ICD-10-CM

## 2020-09-13 NOTE — Patient Instructions (Signed)
STOP SYNTHROID.

## 2020-09-13 NOTE — Progress Notes (Signed)
Established Patient Office Visit  Subjective:  Patient ID: Cynthia [name], female    DOB: [birth year]  Age: 59 y.o. MRN: [id number]  CC:  Chief Complaint  Patient presents with  . Medical Clearance  . Hospitalization Follow-up    HPI Patient was admitted to [facility] for behavioral health due to bipolar disorder, major depression, recurrent, severe. She was placed on depakote 500 mg one twice a day, wellbutrin xl 150 mg once daily in am, and hydroxyzine 10 mg 2 tablets three times a day. She was started on these while admitted for one week. She was also started on synthroid 100 mcg once daily even though her tsh was low. Psychiatry believed she was on synthroid 125 mcg and they were decreasing her dose. She says she has not been on synthroid for some time.   Past Medical History:  Diagnosis Date  . Bipolar 1 disorder (HCC)   . Essential hypertension   . GERD (gastroesophageal reflux disease)     Family History  Problem Relation Age of Onset  . Cancer Mother        lung  . Cancer Father        prostate  . Diabetes Paternal Grandfather     Social History   Socioeconomic History  . Marital status: Divorced    Spouse name: Not on file  . Number of children: 3  . Years of education: Not on file  . Highest education level: Not on file  Occupational History  . Not on file  Tobacco Use  . Smoking status: Never Smoker  . Smokeless tobacco: Never Used  Substance and Sexual Activity  . Alcohol use: Not Currently    Comment: rarely  . Drug use: Never  . Sexual activity: Not on file  Other Topics Concern  . Not on file  Social History Narrative  . Not on file   Social Determinants of Health   Financial Resource Strain:   . Difficulty of Paying Living Expenses: Not on file  Food Insecurity:   . Worried About Programme researcher, broadcasting/film/video in the Last Year: Not on file  . Ran Out of Food in the Last Year: Not on file  Transportation Needs:   . Lack of Transportation  (Medical): Not on file  . Lack of Transportation (Non-Medical): Not on file  Physical Activity:   . Days of Exercise per Week: Not on file  . Minutes of Exercise per Session: Not on file  Stress:   . Feeling of Stress : Not on file  Social Connections:   . Frequency of Communication with Friends and Family: Not on file  . Frequency of Social Gatherings with Friends and Family: Not on file  . Attends Religious Services: Not on file  . Active Member of Clubs or Organizations: Not on file  . Attends Banker Meetings: Not on file  . Marital Status: Not on file  Intimate Partner Violence:   . Fear of Current or Ex-Partner: Not on file  . Emotionally Abused: Not on file  . Physically Abused: Not on file  . Sexually Abused: Not on file    Outpatient Medications Prior to Visit  Medication Sig Dispense Refill  . albuterol (VENTOLIN HFA) 108 (90 Base) MCG/ACT inhaler Inhale 1-2 puffs into the lungs every 4 (four) hours as needed for wheezing or shortness of breath. (Patient not taking: Reported on 08/27/2020)    . buPROPion (WELLBUTRIN XL) 150 MG 24 hr tablet Take 1 tablet (  150 mg total) by mouth daily. For depression 30 tablet 0  . divalproex (DEPAKOTE) 500 MG DR tablet Take 1 tablet (500 mg total) by mouth every 12 (twelve) hours. For mood stabilization 60 tablet 0  . hydrOXYzine (ATARAX/VISTARIL) 10 MG tablet Take 2 tablets (20 mg total) by mouth 3 (three) times daily as needed for anxiety. 60 tablet 0  . ibuprofen (ADVIL) 600 MG tablet Take 1 tablet (600 mg total) by mouth every 6 (six) hours as needed for mild pain or moderate pain. 30 tablet 0  . levothyroxine (SYNTHROID) 100 MCG tablet Take 1 tablet (100 mcg total) by mouth daily at 6 (six) AM. For thyroid hormone replacement 15 tablet 0  . melatonin 3 MG TABS tablet Take 1 tablet (3 mg total) by mouth at bedtime. For sleep 30 tablet 0   No facility-administered medications prior to visit.    No Known  Allergies  ROS Review of Systems  Constitutional: Negative for chills, fatigue and fever.  HENT: Negative for congestion, ear pain, rhinorrhea and sore throat.   Respiratory: Negative for cough and shortness of breath.   Cardiovascular: Negative for chest pain.  Gastrointestinal: Negative for abdominal pain, constipation, diarrhea, nausea and rectal pain.  Endocrine: Negative for polydipsia and polyphagia.  Musculoskeletal: Negative for arthralgias, back pain and myalgias.  Skin: Negative for rash.  Neurological: Negative for dizziness and headaches.  Psychiatric/Behavioral: Positive for dysphoric mood. Negative for agitation, decreased concentration, sleep disturbance and suicidal ideas. The patient is nervous/anxious and is hyperactive.        Denies hallucinations.  Denies paranoia.      Objective:    Physical Exam Constitutional:      [name]: She is not in acute distress.    Appearance: She is well-developed. She is obese.     Comments: Obese.   Cardiovascular:     Rate and Rhythm: Normal rate and regular rhythm.     Heart sounds: Normal heart sounds.  Pulmonary:     Effort: Pulmonary effort is normal. No respiratory distress.     Breath sounds: Normal breath sounds.  Abdominal:     [name]: Bowel sounds are normal.     Tenderness: There is no abdominal tenderness.  Neurological:     Mental Status: She is alert.  Psychiatric:        Mood and Affect: Mood is anxious.    BP 118/80   Pulse 95   Temp (!) 97.3 F (36.3 C)   Ht 5\' 3"  (1.6 m)   Wt 211 lb (95.7 kg)   SpO2 98%   BMI 37.38 kg/m  Wt Readings from Last 3 Encounters:  09/13/20 211 lb (95.7 kg)  08/26/20 220 lb (99.8 kg)  08/22/20 209 lb (94.8 kg)   Health Maintenance Due  Topic Date Due  . Hepatitis C Screening  Never done  . COVID-19 Vaccine (1) Never done  . HIV Screening  Never done  . TETANUS/TDAP  Never done  . PAP SMEAR-Modifier  Never done    There are no preventive care reminders to  display for this patient.  Lab Results  Component Value Date   TSH 0.258 (L) 08/29/2020   Lab Results  Component Value Date   WBC 9.8 08/26/2020   HGB 14.5 08/26/2020   HCT 42.0 08/26/2020   MCV 86.6 08/26/2020   PLT 436 (H) 08/26/2020   Lab Results  Component Value Date   NA 140 08/28/2020   K 3.8 08/28/2020   CO2 27  08/28/2020   GLUCOSE 116 (H) 08/28/2020   BUN 11 08/28/2020   CREATININE 0.75 08/28/2020   BILITOT 0.8 08/26/2020   ALKPHOS 84 08/26/2020   AST 19 08/26/2020   ALT 17 08/26/2020   PROT 7.8 08/26/2020   ALBUMIN 4.2 08/26/2020   CALCIUM 9.3 08/28/2020   ANIONGAP 12 08/28/2020   Lab Results  Component Value Date   CHOL 208 (H) 08/29/2020   Lab Results  Component Value Date   HDL 48 08/29/2020   Lab Results  Component Value Date   LDLCALC 131 (H) 08/29/2020   Lab Results  Component Value Date   TRIG 144 08/29/2020   Lab Results  Component Value Date   CHOLHDL 4.3 08/29/2020   Lab Results  Component Value Date   HGBA1C 5.6 08/29/2020      Assessment & Plan:  1. Abnormal TSH Recheck tsh at next visit  Stop synthroid.   2. Bipolar disorder, current episode depressed, severe, without psychotic features (HCC) The current medical regimen is effective;  continue present plan and medications. Follow up with psychiatry  Follow-up: Return in about 4 weeks (around 10/11/2020).    Blane Ohara, MD

## 2020-09-24 ENCOUNTER — Ambulatory Visit: Payer: Medicare HMO | Admitting: Family Medicine

## 2020-09-26 DIAGNOSIS — R69 Illness, unspecified: Secondary | ICD-10-CM | POA: Diagnosis not present

## 2020-10-22 ENCOUNTER — Other Ambulatory Visit: Payer: Self-pay

## 2020-10-22 ENCOUNTER — Encounter: Payer: Self-pay | Admitting: Family Medicine

## 2020-10-22 ENCOUNTER — Ambulatory Visit (INDEPENDENT_AMBULATORY_CARE_PROVIDER_SITE_OTHER): Payer: Medicare HMO | Admitting: Family Medicine

## 2020-10-22 VITALS — BP 136/86 | HR 88 | Temp 97.5°F | Resp 16 | Ht 63.0 in | Wt 219.6 lb

## 2020-10-22 DIAGNOSIS — E875 Hyperkalemia: Secondary | ICD-10-CM

## 2020-10-22 DIAGNOSIS — F3132 Bipolar disorder, current episode depressed, moderate: Secondary | ICD-10-CM | POA: Diagnosis not present

## 2020-10-22 DIAGNOSIS — R7989 Other specified abnormal findings of blood chemistry: Secondary | ICD-10-CM

## 2020-10-22 DIAGNOSIS — R69 Illness, unspecified: Secondary | ICD-10-CM | POA: Diagnosis not present

## 2020-10-22 NOTE — Progress Notes (Signed)
Subjective:  Patient ID: Cynthia [name], female    DOB: 1961-02-11  Age: 59 y.o. MRN: [id number]  Chief Complaint  Patient presents with   Hypothyroidism    HPI Hypothyroidism: Patient presents for follow-up of hypothyroidism.  The patient was admitted to the behavioral health unit at [facility] approximately 6 weeks ago. They had checked a TSH level.  They had recommended to adjust her Synthroid.  I have no record of the patient having been on Synthroid nor does the patient recall ever being on Synthroid.  As such I had stopped the Synthroid as I found it was erroneously prescribed.  I was concerned this would do her into another manic episode due to forcing the setting of hyperthyroidism.  The patient has been off of it for 6 weeks and returns now to recheck her TSH and free T4.  Bipolar, moderate major depression, recurrent: Patient sees psychiatry.  She did see them after she was discharged from the hospital.  Patient says she was not honest with how poorly she was feeling with a psychiatrist.  She is scheduled to go back in approximately 1 month.  She refuses counseling.  She denies suicidal ideation.  She does suffer from significant anhedonia, depression, and somnolence.  Medications reviewed and accurate on her med list.    Current Outpatient Medications on File Prior to Visit  Medication Sig Dispense Refill   albuterol (VENTOLIN HFA) 108 (90 Base) MCG/ACT inhaler Inhale 1-2 puffs into the lungs every 4 (four) hours as needed for wheezing or shortness of breath. (Patient not taking: Reported on 08/27/2020)     buPROPion (WELLBUTRIN XL) 150 MG 24 hr tablet Take 1 tablet (150 mg total) by mouth daily. For depression 30 tablet 0   divalproex (DEPAKOTE) 500 MG DR tablet Take 1 tablet (500 mg total) by mouth every 12 (twelve) hours. For mood stabilization 60 tablet 0   hydrOXYzine (ATARAX/VISTARIL) 10 MG tablet Take 2 tablets (20 mg total) by mouth 3 (three) times daily as  needed for anxiety. 60 tablet 0   ibuprofen (ADVIL) 600 MG tablet Take 1 tablet (600 mg total) by mouth every 6 (six) hours as needed for mild pain or moderate pain. 30 tablet 0   melatonin 3 MG TABS tablet Take 1 tablet (3 mg total) by mouth at bedtime. For sleep 30 tablet 0   No current facility-administered medications on file prior to visit.   Past Medical History:  Diagnosis Date   Bipolar 1 disorder (HCC)    Essential hypertension    GERD (gastroesophageal reflux disease)    History reviewed. No pertinent surgical history.  Family History  Problem Relation Age of Onset   Cancer Mother        lung   Cancer Father        prostate   Diabetes Paternal Grandfather    Social History   Socioeconomic History   Marital status: Divorced    Spouse name: Not on file   Number of children: 3   Years of education: Not on file   Highest education level: Not on file  Occupational History   Not on file  Tobacco Use   Smoking status: Never Smoker   Smokeless tobacco: Never Used  Substance and Sexual Activity   Alcohol use: Not Currently    Comment: rarely   Drug use: Never   Sexual activity: Not on file  Other Topics Concern   Not on file  Social History Narrative  Not on file   Social Determinants of Health   Financial Resource Strain:    Difficulty of Paying Living Expenses: Not on file  Food Insecurity:    Worried About Running Out of Food in the Last Year: Not on file   Ran Out of Food in the Last Year: Not on file  Transportation Needs:    Lack of Transportation (Medical): Not on file   Lack of Transportation (Non-Medical): Not on file  Physical Activity:    Days of Exercise per Week: Not on file   Minutes of Exercise per Session: Not on file  Stress:    Feeling of Stress : Not on file  Social Connections:    Frequency of Communication with Friends and Family: Not on file   Frequency of Social Gatherings with Friends and Family: Not  on file   Attends Religious Services: Not on file   Active Member of Clubs or Organizations: Not on file   Attends Banker Meetings: Not on file   Marital Status: Not on file    Review of Systems  Constitutional: Negative for chills, fatigue and fever.  HENT: Negative for congestion, rhinorrhea and sore throat.   Respiratory: Negative for cough and shortness of breath.   Cardiovascular: Negative for chest pain and palpitations.  Gastrointestinal: Negative for abdominal pain, constipation, diarrhea, nausea and vomiting.  Genitourinary: Negative for dysuria and frequency.  Musculoskeletal: Negative for arthralgias, back pain and myalgias.  Neurological: Negative for dizziness and headaches.  Psychiatric/Behavioral: Positive for dysphoric mood. The patient is nervous/anxious.      Objective:  BP 136/86    Pulse 88    Temp (!) 97.5 F (36.4 C)    Resp 16    Ht 5\' 3"  (1.6 m)    Wt 219 lb 9.6 oz (99.6 kg)    BMI 38.90 kg/m   BP/Weight 10/22/2020 09/13/2020 08/28/2020  Systolic BP 136 118 147  Diastolic BP 86 80 121  Wt. (Lbs) 219.6 211 -  BMI 38.9 37.38 -  Some encounter information is confidential and restricted. Go to Review Flowsheets activity to see all data.    Physical Exam Vitals reviewed.  Constitutional:      Appearance: Normal appearance. She is obese.  Cardiovascular:     Rate and Rhythm: Normal rate and regular rhythm.     Heart sounds: Normal heart sounds.  Pulmonary:     Effort: Pulmonary effort is normal. No respiratory distress.     Breath sounds: Normal breath sounds.  Abdominal:     Palpations: Abdomen is soft.     Tenderness: There is no abdominal tenderness.  Neurological:     Mental Status: She is alert and oriented to person, place, and time.  Psychiatric:        Attention and Perception: She is inattentive.        Mood and Affect: Mood is depressed. Affect is blunt and flat.        Speech: Speech is delayed.        Behavior: Behavior  is slowed and withdrawn.        Thought Content: Thought content does not include suicidal ideation.        Judgment: Judgment is inappropriate.     Diabetic Foot Exam - Simple   No data filed       Lab Results  Component Value Date   WBC 9.8 08/26/2020   HGB 14.5 08/26/2020   HCT 42.0 08/26/2020   PLT 436 (H)  08/26/2020   GLUCOSE 116 (H) 08/28/2020   CHOL 208 (H) 08/29/2020   TRIG 144 08/29/2020   HDL 48 08/29/2020   LDLCALC 131 (H) 08/29/2020   ALT 17 08/26/2020   AST 19 08/26/2020   NA 140 08/28/2020   K 3.8 08/28/2020   CL 101 08/28/2020   CREATININE 0.75 08/28/2020   BUN 11 08/28/2020   CO2 27 08/28/2020   TSH 0.258 (L) 08/29/2020   INR 1.0 07/03/2019   HGBA1C 5.6 08/29/2020      Assessment & Plan:   1. Abnormal TSH - TSH - T4, Free  2. Hyperkalemia - Comprehensive metabolic panel  3. Bipolar 1 disorder, depressed, moderate (HCC) I strongly recommended the patient call her psychiatrist back and discuss her worsening depression.  Patient does feel that she is much better than she was when she saw me in September when she was in a manic episode.  Orders Placed This Encounter  Procedures   TSH   T4, Free   Comprehensive metabolic panel     I spent 20 minutes dedicated to the care of this patient on the date of this encounter to include face-to-face time with the patient, as well as: Preparing to see the patient (review of tests). Obtaining and/or reviewing separately obtained history. Performing a medically appropriate examination and/or evaluation. Counseling and educating the patient on depression/manic symptoms and appropriate follow up with psychiatry. Ordering tests Documenting clinical information in the electronic or other health record.  My nursing staff have aided in the documentation of this note on the behalf of Blane Ohara, MD,as directed by  Blane Ohara, MD and thoroughly reviewed by Blane Ohara, MD.  Follow-up: Return in about 2  months (around 12/25/2020) for fasting followup.  An After Visit Summary was printed and given to the patient.  Blane Ohara, MD [facility] [phone]

## 2020-10-23 LAB — COMPREHENSIVE METABOLIC PANEL
ALT: 6 IU/L (ref 0–32)
AST: 6 IU/L (ref 0–40)
Albumin/Globulin Ratio: 1.5 (ref 1.2–2.2)
Albumin: 4 g/dL (ref 3.8–4.9)
Alkaline Phosphatase: 79 IU/L (ref 44–121)
BUN/Creatinine Ratio: 11 (ref 9–23)
BUN: 9 mg/dL (ref 6–24)
Bilirubin Total: 0.3 mg/dL (ref 0.0–1.2)
CO2: 25 mmol/L (ref 20–29)
Calcium: 9.6 mg/dL (ref 8.7–10.2)
Chloride: 104 mmol/L (ref 96–106)
Creatinine, Ser: 0.8 mg/dL (ref 0.57–1.00)
GFR calc Af Amer: 95 mL/min/{1.73_m2} (ref >59)
GFR calc non Af Amer: 83 mL/min/{1.73_m2} (ref >59)
Globulin, Total: 2.7 g/dL (ref 1.5–4.5)
Glucose: 81 mg/dL (ref 65–99)
Potassium: 4.6 mmol/L (ref 3.5–5.2)
Sodium: 144 mmol/L (ref 134–144)
Total Protein: 6.7 g/dL (ref 6.0–8.5)

## 2020-10-23 LAB — T4, FREE: Free T4: 1.19 ng/dL (ref 0.82–1.77)

## 2020-10-23 LAB — TSH: TSH: 0.887 u[IU]/mL (ref 0.450–4.500)

## 2020-10-24 DIAGNOSIS — R69 Illness, unspecified: Secondary | ICD-10-CM | POA: Diagnosis not present

## 2020-12-06 ENCOUNTER — Encounter: Payer: Self-pay | Admitting: Family Medicine

## 2020-12-12 DIAGNOSIS — R69 Illness, unspecified: Secondary | ICD-10-CM | POA: Diagnosis not present

## 2020-12-24 ENCOUNTER — Ambulatory Visit: Payer: Medicare HMO | Admitting: Family Medicine

## 2021-01-09 DIAGNOSIS — R69 Illness, unspecified: Secondary | ICD-10-CM | POA: Diagnosis not present

## 2021-03-01 IMAGING — CT CT HEAD W/O CM
3 series · 15 of 47 positions shown, 18 images · non-contrast
Comparison: July 03, 2019

CLINICAL DATA: Unwitnessed fall.

EXAM:
CT HEAD WITHOUT CONTRAST
TECHNIQUE: Contiguous axial images were obtained from the base of the skull
through the vertex without intravenous contrast.

[Series 2: head wo · axial · 0.45mm/px · z∈[-110,+20]mm · 9 of 32 slices shown, 12 images]
[im 3/32  brain]
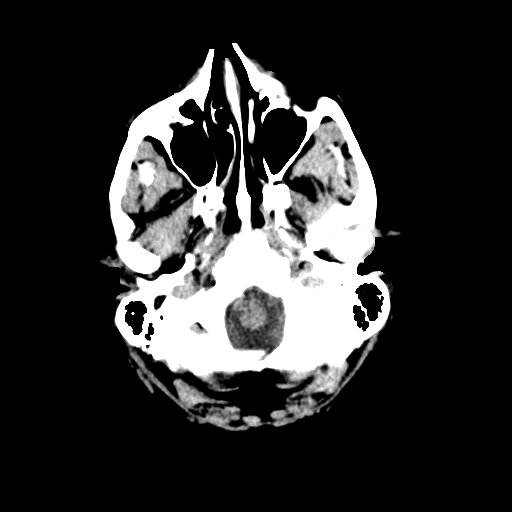
[im 3/32  bone]
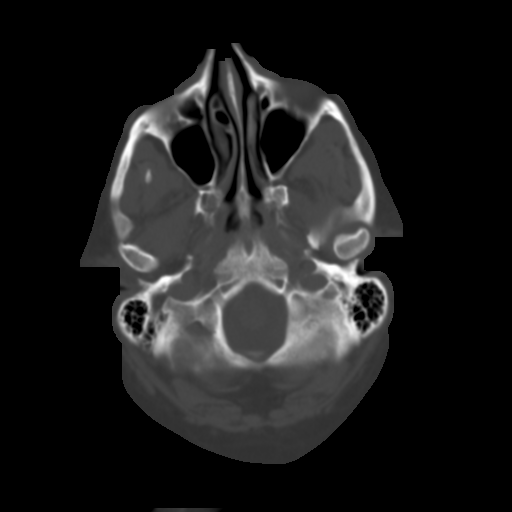
[im 6/32  brain]
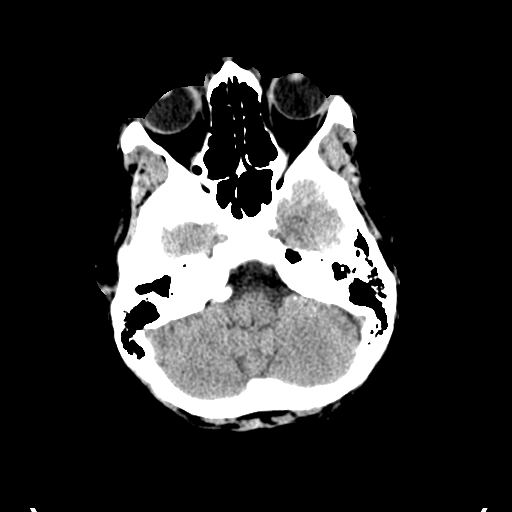
[im 9/32  brain]
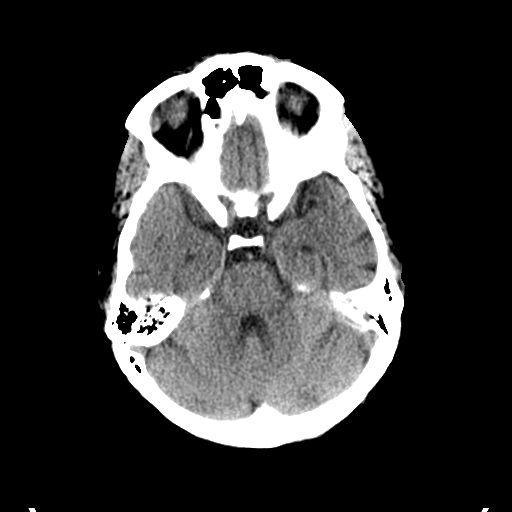
[im 12/32  brain]
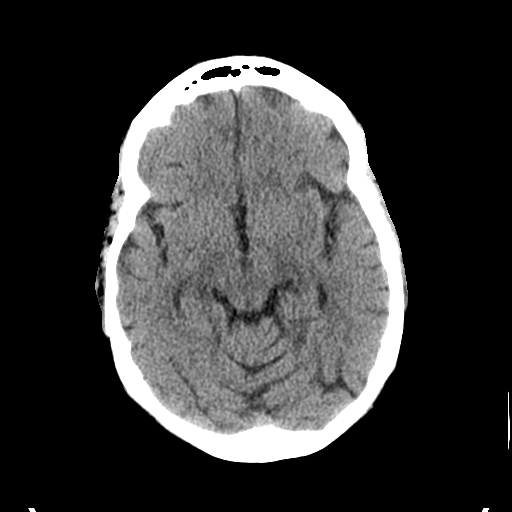
[im 17/32  brain]
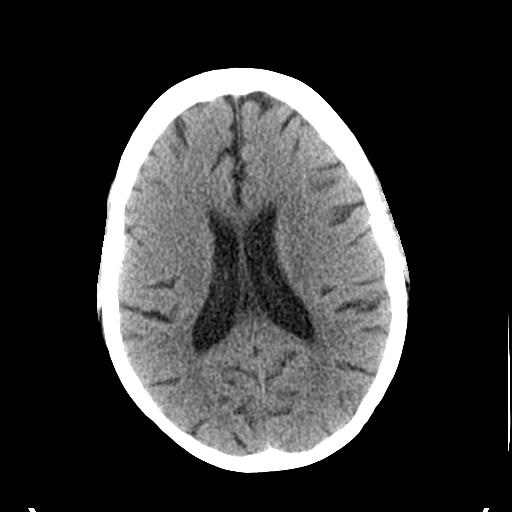
[im 17/32  bone]
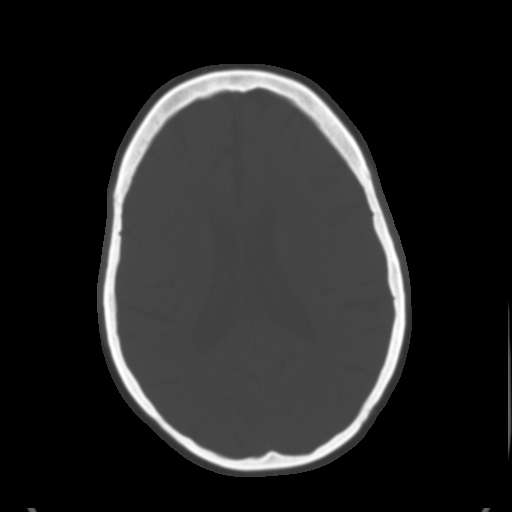
[im 20/32  brain]
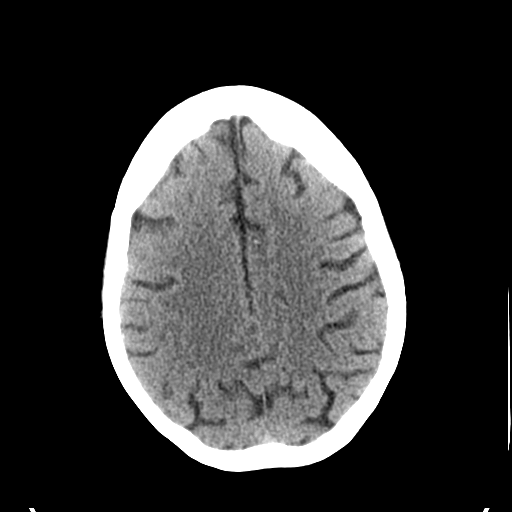
[im 23/32  brain]
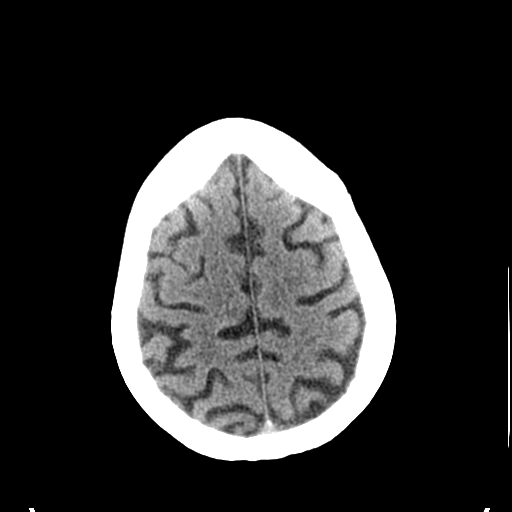
[im 26/32  brain]
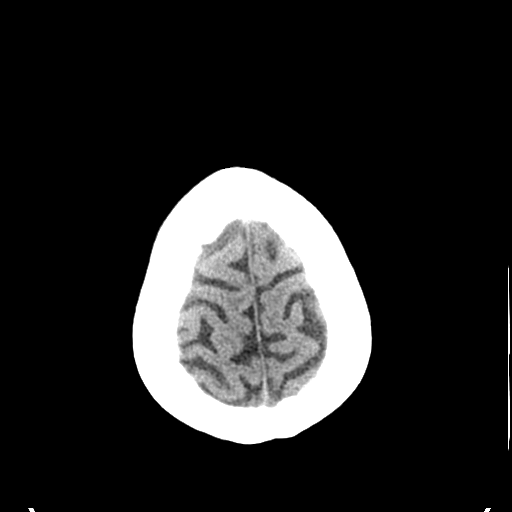
[im 29/32  brain]
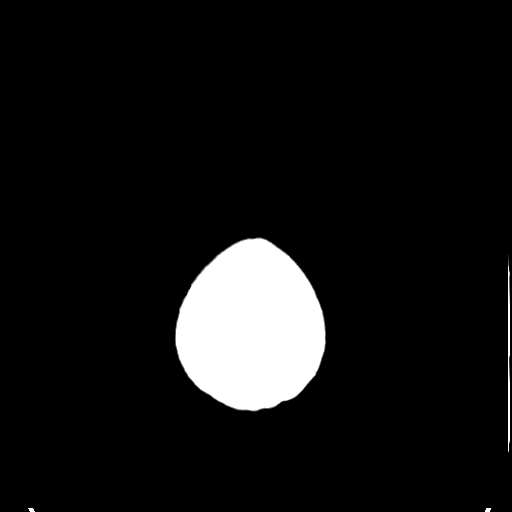
[im 29/32  bone]
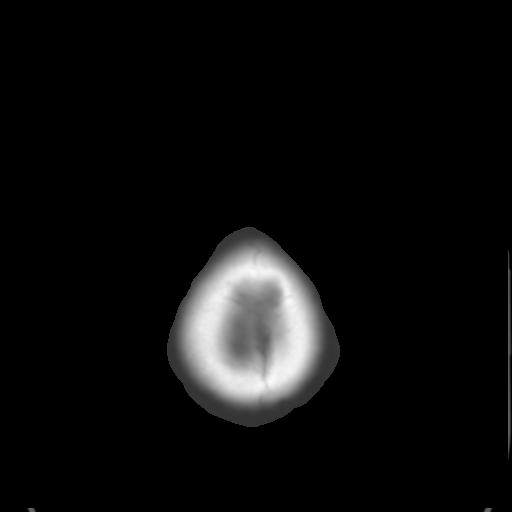

[Series 4: coronal soft tissue · coronal · 0.31mm/px · 3 of 70 slices shown]
[im 24/70  brain]
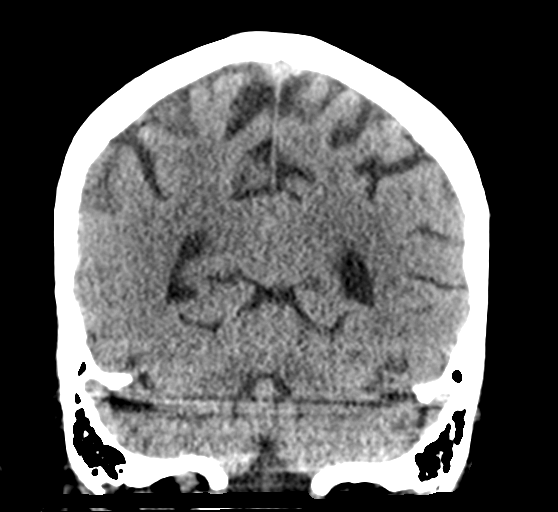
[im 31/70  brain]
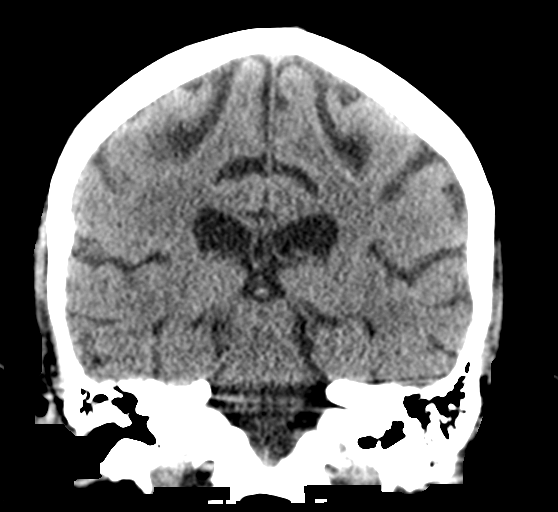
[im 39/70  brain]
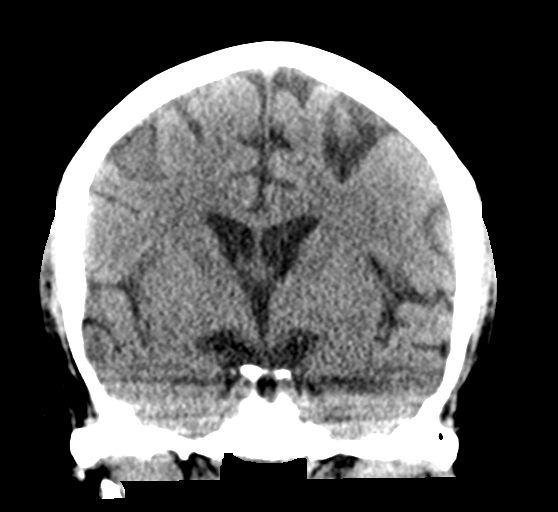

[Series 5: sagittal soft tissue · sagittal · 0.32mm/px · 3 of 57 slices shown]
[im 19/57  brain]
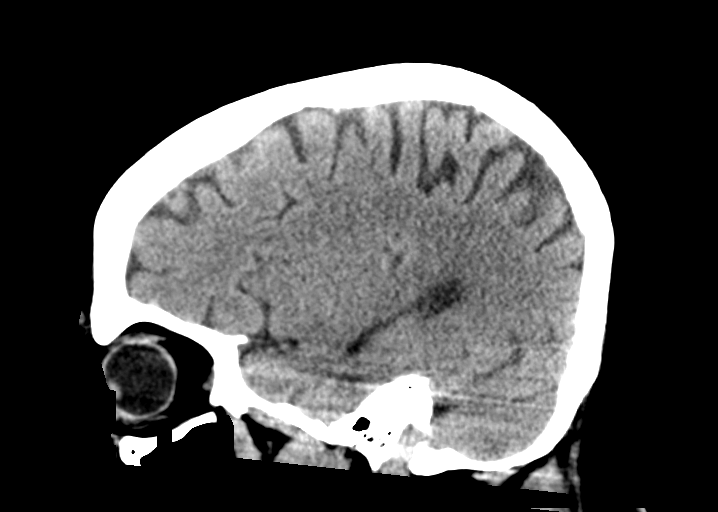
[im 29/57  brain]
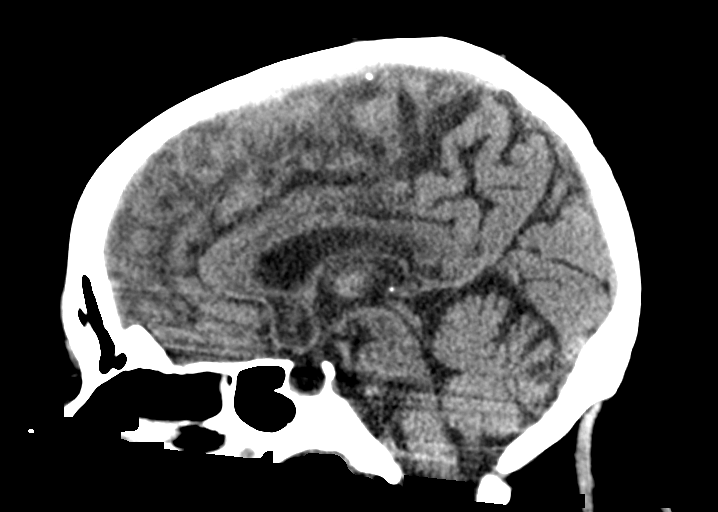
[im 38/57  brain]
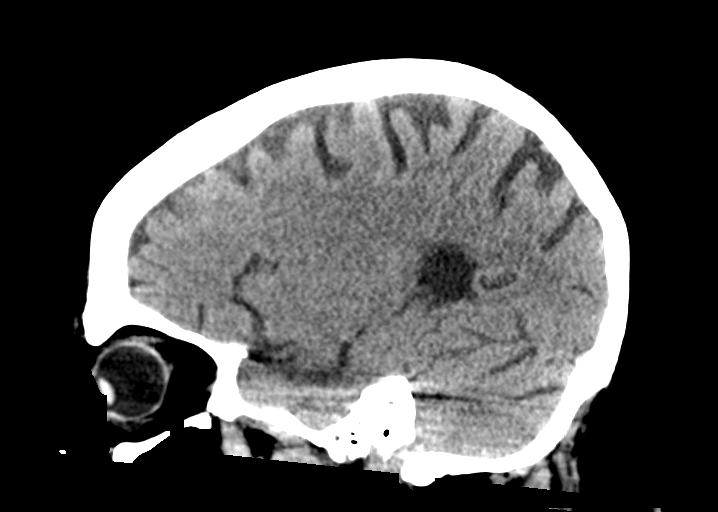

[15 of 47 positions shown; findings below may reference images not displayed]

FINDINGS: Brain: There is mild cerebral atrophy with widening of the
extra-axial spaces and ventricular dilatation.
There are areas of decreased attenuation within the white matter
tracts of the supratentorial brain, consistent with microvascular
disease changes.

Vascular: No hyperdense vessel or unexpected calcification.

Skull: Normal. Negative for fracture or focal lesion.

Sinuses/Orbits: No acute finding.

Other: None.
IMPRESSION: 1. Mild cerebral atrophy.
2. No acute intracranial abnormality.

## 2021-03-13 DIAGNOSIS — R69 Illness, unspecified: Secondary | ICD-10-CM | POA: Diagnosis not present

## 2021-06-18 DIAGNOSIS — R69 Illness, unspecified: Secondary | ICD-10-CM | POA: Diagnosis not present

## 2021-07-03 ENCOUNTER — Other Ambulatory Visit: Payer: Self-pay

## 2021-07-03 ENCOUNTER — Ambulatory Visit (INDEPENDENT_AMBULATORY_CARE_PROVIDER_SITE_OTHER): Payer: Medicare HMO | Admitting: Legal Medicine

## 2021-07-03 ENCOUNTER — Encounter: Payer: Self-pay | Admitting: Legal Medicine

## 2021-07-03 VITALS — BP 112/80 | HR 88 | Temp 97.3°F | Resp 16 | Ht 63.0 in | Wt 262.2 lb

## 2021-07-03 DIAGNOSIS — R600 Localized edema: Secondary | ICD-10-CM | POA: Diagnosis not present

## 2021-07-03 MED ORDER — FUROSEMIDE 40 MG PO TABS: 40.0000 mg | ORAL_TABLET | Freq: Every day | ORAL | 3 refills | Status: DC

## 2021-07-03 NOTE — Progress Notes (Signed)
Established Patient Office Visit  Subjective:  Patient ID: Cynthia [name], female    DOB: [birth year]  Age: 60 y.o. MRN: [id number]  CC:  Chief Complaint  Patient presents with   Leg Pain   Leg Swelling    HPI Cynthia [name] presents for leg edema for 2 weeks, no history of cardiac disease, she does walk much.  Past Medical History:  Diagnosis Date   Bipolar 1 disorder (HCC)    Essential hypertension    GERD (gastroesophageal reflux disease)     History reviewed. No pertinent surgical history.  Family History  Problem Relation Age of Onset   Cancer Mother        lung   Cancer Father        prostate   Diabetes Paternal Grandfather     Social History   Socioeconomic History   Marital status: Divorced    Spouse name: Not on file   Number of children: 3   Years of education: Not on file   Highest education level: Not on file  Occupational History   Not on file  Tobacco Use   Smoking status: Never   Smokeless tobacco: Never  Substance and Sexual Activity   Alcohol use: Not Currently    Comment: rarely   Drug use: Never   Sexual activity: Not on file  Other Topics Concern   Not on file  Social History Narrative   Not on file   Social Determinants of Health   Financial Resource Strain: Not on file  Food Insecurity: Not on file  Transportation Needs: Not on file  Physical Activity: Not on file  Stress: Not on file  Social Connections: Not on file  Intimate Partner Violence: Not on file    Outpatient Medications Prior to Visit  Medication Sig Dispense Refill   albuterol (VENTOLIN HFA) 108 (90 Base) MCG/ACT inhaler Inhale 1-2 puffs into the lungs every 4 (four) hours as needed for wheezing or shortness of breath. (Patient not taking: Reported on 08/27/2020)     buPROPion (WELLBUTRIN XL) 150 MG 24 hr tablet Take 1 tablet (150 mg total) by mouth daily. For depression 30 tablet 0   divalproex (DEPAKOTE) 500 MG DR tablet Take 1 tablet (500 mg total)  by mouth every 12 (twelve) hours. For mood stabilization 60 tablet 0   hydrOXYzine (ATARAX/VISTARIL) 10 MG tablet Take 2 tablets (20 mg total) by mouth 3 (three) times daily as needed for anxiety. 60 tablet 0   ibuprofen (ADVIL) 600 MG tablet Take 1 tablet (600 mg total) by mouth every 6 (six) hours as needed for mild pain or moderate pain. 30 tablet 0   melatonin 3 MG TABS tablet Take 1 tablet (3 mg total) by mouth at bedtime. For sleep 30 tablet 0   No facility-administered medications prior to visit.    No Known Allergies  ROS Review of Systems  Constitutional:  Negative for activity change and appetite change.  HENT:  Negative for congestion.   Eyes:  Negative for visual disturbance.  Respiratory:  Negative for cough and chest tightness.   Cardiovascular:  Negative for chest pain, palpitations and leg swelling.  Gastrointestinal:  Negative for abdominal distention and abdominal pain.  Endocrine: Negative for polyuria.  Genitourinary:  Negative for difficulty urinating and dysuria.  Musculoskeletal:  Negative for arthralgias and back pain.  Neurological: Negative.   Hematological: Negative.   Psychiatric/Behavioral: Negative.       Objective:    Physical Exam Vitals  reviewed.  Constitutional:      Appearance: Normal appearance. She is obese.  HENT:     Right Ear: Tympanic membrane normal.     Left Ear: Tympanic membrane normal.  Eyes:     Extraocular Movements: Extraocular movements intact.     Conjunctiva/sclera: Conjunctivae normal.     Pupils: Pupils are equal, round, and reactive to light.  Cardiovascular:     Rate and Rhythm: Normal rate and regular rhythm.     Pulses: Normal pulses.     Heart sounds: Normal heart sounds. No murmur heard.   No gallop.  Pulmonary:     Effort: Pulmonary effort is normal. No respiratory distress.     Breath sounds: Normal breath sounds. No wheezing.  Abdominal:     General: Abdomen is flat. Bowel sounds are normal. There is no  distension.     Palpations: Abdomen is soft.     Tenderness: There is no abdominal tenderness.  Musculoskeletal:        General: Normal range of motion.     Right lower leg: Edema present.     Left lower leg: Edema present.  Skin:    General: Skin is warm.     Capillary Refill: Capillary refill takes less than 2 seconds.  Neurological:     General: No focal deficit present.     Mental Status: She is alert and oriented to person, place, and time.    BP 112/80   Pulse 88   Temp (!) 97.3 F (36.3 C)   Resp 16   Ht 5\' 3"  (1.6 m)   Wt 262 lb 3.2 oz (118.9 kg)   SpO2 95%   BMI 46.45 kg/m  Wt Readings from Last 3 Encounters:  07/03/21 262 lb 3.2 oz (118.9 kg)  10/22/20 219 lb 9.6 oz (99.6 kg)  09/13/20 211 lb (95.7 kg)     Health Maintenance Due  Topic Date Due   COVID-19 Vaccine (1) Never done   HIV Screening  Never done   Hepatitis C Screening  Never done   TETANUS/TDAP  Never done   PAP SMEAR-Modifier  Never done   Zoster Vaccines- Shingrix (1 of 2) Never done   MAMMOGRAM  01/25/2021    There are no preventive care reminders to display for this patient.  Lab Results  Component Value Date   TSH 0.887 10/22/2020   Lab Results  Component Value Date   WBC 9.8 08/26/2020   HGB 14.5 08/26/2020   HCT 42.0 08/26/2020   MCV 86.6 08/26/2020   PLT 436 (H) 08/26/2020   Lab Results  Component Value Date   NA 144 10/22/2020   K 4.6 10/22/2020   CO2 25 10/22/2020   GLUCOSE 81 10/22/2020   BUN 9 10/22/2020   CREATININE 0.80 10/22/2020   BILITOT 0.3 10/22/2020   ALKPHOS 79 10/22/2020   AST 6 10/22/2020   ALT 6 10/22/2020   PROT 6.7 10/22/2020   ALBUMIN 4.0 10/22/2020   CALCIUM 9.6 10/22/2020   ANIONGAP 12 08/28/2020   Lab Results  Component Value Date   CHOL 208 (H) 08/29/2020   Lab Results  Component Value Date   HDL 48 08/29/2020   Lab Results  Component Value Date   LDLCALC 131 (H) 08/29/2020   Lab Results  Component Value Date   TRIG 144  08/29/2020   Lab Results  Component Value Date   CHOLHDL 4.3 08/29/2020   Lab Results  Component Value Date   HGBA1C 5.6 08/29/2020  Assessment & Plan:   Problem List Items Addressed This Visit       Other   Pedal edema - Primary   Relevant Medications   furosemide (LASIX) 40 MG tablet   Other Relevant Orders   Comprehensive metabolic panel  Start fusosemide and use support hose to control edema  Meds ordered this encounter  Medications   furosemide (LASIX) 40 MG tablet    Sig: Take 1 tablet (40 mg total) by mouth daily.    Dispense:  30 tablet    Refill:  3    Follow-up: Return in about 2 weeks (around 07/17/2021) for dr cox follow up.    Brent Bulla, MD

## 2021-07-04 LAB — COMPREHENSIVE METABOLIC PANEL
ALT: 13 IU/L (ref 0–32)
AST: 17 IU/L (ref 0–40)
Albumin/Globulin Ratio: 1.5 (ref 1.2–2.2)
Albumin: 3.8 g/dL (ref 3.8–4.9)
Alkaline Phosphatase: 68 IU/L (ref 44–121)
BUN/Creatinine Ratio: 13 (ref 9–23)
BUN: 11 mg/dL (ref 6–24)
Bilirubin Total: 0.4 mg/dL (ref 0.0–1.2)
CO2: 28 mmol/L (ref 20–29)
Calcium: 9.3 mg/dL (ref 8.7–10.2)
Chloride: 105 mmol/L (ref 96–106)
Creatinine, Ser: 0.83 mg/dL (ref 0.57–1.00)
Globulin, Total: 2.6 g/dL (ref 1.5–4.5)
Glucose: 88 mg/dL (ref 65–99)
Potassium: 4.9 mmol/L (ref 3.5–5.2)
Sodium: 143 mmol/L (ref 134–144)
Total Protein: 6.4 g/dL (ref 6.0–8.5)
eGFR: 82 mL/min/{1.73_m2} (ref >59)

## 2021-07-04 NOTE — Progress Notes (Signed)
Kidney and liver tests normal, potassium 4.9 normal lp

## 2021-07-19 ENCOUNTER — Ambulatory Visit (INDEPENDENT_AMBULATORY_CARE_PROVIDER_SITE_OTHER): Payer: Medicare HMO | Admitting: Family Medicine

## 2021-07-19 ENCOUNTER — Other Ambulatory Visit: Payer: Self-pay

## 2021-07-19 ENCOUNTER — Encounter: Payer: Self-pay | Admitting: Family Medicine

## 2021-07-19 VITALS — BP 122/68 | HR 104 | Temp 96.9°F | Ht 63.0 in | Wt 260.0 lb

## 2021-07-19 DIAGNOSIS — R0600 Dyspnea, unspecified: Secondary | ICD-10-CM | POA: Diagnosis not present

## 2021-07-19 DIAGNOSIS — M545 Low back pain, unspecified: Secondary | ICD-10-CM

## 2021-07-19 DIAGNOSIS — R0609 Other forms of dyspnea: Secondary | ICD-10-CM

## 2021-07-19 DIAGNOSIS — R600 Localized edema: Secondary | ICD-10-CM

## 2021-07-19 DIAGNOSIS — R9431 Abnormal electrocardiogram [ECG] [EKG]: Secondary | ICD-10-CM | POA: Diagnosis not present

## 2021-07-19 MED ORDER — FUROSEMIDE 40 MG PO TABS: 40.0000 mg | ORAL_TABLET | Freq: Two times a day (BID) | ORAL | 1 refills | Status: DC

## 2021-07-19 NOTE — Patient Instructions (Signed)
Increase lasix to 40 mg one twice a day.  Get lumbar xray.  Ordering an echocardiogram.

## 2021-07-19 NOTE — Progress Notes (Signed)
Subjective:  Patient ID: Cynthia [name], female    DOB: 01-Apr-1961  Age: 60 y.o. MRN: [id number]  Chief Complaint  Patient presents with   Edema    HPI  Patient states that she has been taking her fluid pills however can not see that her rx is helping a lot. It helps some along with wearing compression socks. Pt has dyspnea on exertion. NO PND or Orthopnea.  Was given Lasix 40 mg once daily.  Has not helped much.  Patient is complaining of bilateral ankle pain as well as low back pain.  Ankle pain may be related to the swelling.  Her low back pain seems to radiate posterior legs bilaterally.  Current Outpatient Medications on File Prior to Visit  Medication Sig Dispense Refill   albuterol (VENTOLIN HFA) 108 (90 Base) MCG/ACT inhaler Inhale 1-2 puffs into the lungs every 4 (four) hours as needed for wheezing or shortness of breath. (Patient not taking: Reported on 08/27/2020)     buPROPion (WELLBUTRIN XL) 150 MG 24 hr tablet Take 1 tablet (150 mg total) by mouth daily. For depression 30 tablet 0   divalproex (DEPAKOTE) 500 MG DR tablet Take 1 tablet (500 mg total) by mouth every 12 (twelve) hours. For mood stabilization 60 tablet 0   hydrOXYzine (ATARAX/VISTARIL) 10 MG tablet Take 2 tablets (20 mg total) by mouth 3 (three) times daily as needed for anxiety. 60 tablet 0   ibuprofen (ADVIL) 600 MG tablet Take 1 tablet (600 mg total) by mouth every 6 (six) hours as needed for mild pain or moderate pain. 30 tablet 0   melatonin 3 MG TABS tablet Take 1 tablet (3 mg total) by mouth at bedtime. For sleep 30 tablet 0   No current facility-administered medications on file prior to visit.   Past Medical History:  Diagnosis Date   Bipolar 1 disorder (HCC)    Essential hypertension    GERD (gastroesophageal reflux disease)    History reviewed. No pertinent surgical history.  Family History  Problem Relation Age of Onset   Cancer Mother        lung   Cancer Father        prostate    Diabetes Paternal Grandfather    Social History   Socioeconomic History   Marital status: Divorced    Spouse name: Not on file   Number of children: 3   Years of education: Not on file   Highest education level: Not on file  Occupational History   Not on file  Tobacco Use   Smoking status: Never   Smokeless tobacco: Never  Substance and Sexual Activity   Alcohol use: Not Currently    Comment: rarely   Drug use: Never   Sexual activity: Not on file  Other Topics Concern   Not on file  Social History Narrative   Not on file   Social Determinants of Health   Financial Resource Strain: Not on file  Food Insecurity: Not on file  Transportation Needs: Not on file  Physical Activity: Not on file  Stress: Not on file  Social Connections: Not on file    Review of Systems  Constitutional:  Negative for chills, fatigue and fever.  HENT:  Negative for congestion, ear pain, rhinorrhea and sore throat.   Respiratory:  Negative for cough and shortness of breath.   Cardiovascular:  Positive for leg swelling. Negative for chest pain.  Musculoskeletal:  Positive for arthralgias (Ankle pain) and back pain (lower).  Objective:  BP 122/68   Pulse (!) 104   Temp (!) 96.9 F (36.1 C)   Ht 5\' 3"  (1.6 m)   Wt 260 lb (117.9 kg)   SpO2 100%   BMI 46.06 kg/m   BP/Weight 07/19/2021 07/03/2021 10/22/2020  Systolic BP 122 112 136  Diastolic BP 68 80 86  Wt. (Lbs) 260 262.2 219.6  BMI 46.06 46.45 38.9  Some encounter information is confidential and restricted. Go to Review Flowsheets activity to see all data.    Physical Exam Vitals reviewed.  Constitutional:      Appearance: Normal appearance. She is obese.  Cardiovascular:     Rate and Rhythm: Normal rate and regular rhythm.     Pulses: Normal pulses.     Heart sounds: Normal heart sounds.  Pulmonary:     Effort: Pulmonary effort is normal.     Breath sounds: Normal breath sounds.  Abdominal:     Palpations: Abdomen is  soft.     Tenderness: There is no abdominal tenderness.  Musculoskeletal:        General: Tenderness (lumbar midline. Neg SLR BL.) present.     Right lower leg: Edema present.     Left lower leg: Edema present.  Neurological:     Mental Status: She is alert.   Lab Results  Component Value Date   WBC 9.8 08/26/2020   HGB 14.5 08/26/2020   HCT 42.0 08/26/2020   PLT 436 (H) 08/26/2020   GLUCOSE 88 07/03/2021   CHOL 208 (H) 08/29/2020   TRIG 144 08/29/2020   HDL 48 08/29/2020   LDLCALC 131 (H) 08/29/2020   ALT 13 07/03/2021   AST 17 07/03/2021   NA 143 07/03/2021   K 4.9 07/03/2021   CL 105 07/03/2021   CREATININE 0.83 07/03/2021   BUN 11 07/03/2021   CO2 28 07/03/2021   TSH 0.887 10/22/2020   INR 1.0 07/03/2019   HGBA1C 5.6 08/29/2020      Assessment & Plan:   1. Pedal edema Increase lasix to 40 mg one twice a day.  - Comprehensive metabolic panel - TSH  2. Dyspnea on exertion - ECHOCARDIOGRAM COMPLETE - EKG 12-Lead  3. Lumbar pain - DG Lumbar Spine Complete   4. Prolonged qt interval Await labs as it may be related to electrolytes. No obvious medicines on pt list that cause qt prolongation.  Meds ordered this encounter  Medications   furosemide (LASIX) 40 MG tablet    Sig: Take 1 tablet (40 mg total) by mouth 2 (two) times daily.    Dispense:  60 tablet    Refill:  1    Orders Placed This Encounter  Procedures   DG Lumbar Spine Complete   Comprehensive metabolic panel   TSH   EKG 12-Lead   ECHOCARDIOGRAM COMPLETE     Follow-up: Return in about 4 weeks (around 08/16/2021) for fasting.  An After Visit Summary was printed and given to the patient.  10/16/2021, MD [facility] [phone]

## 2021-07-20 LAB — COMPREHENSIVE METABOLIC PANEL
ALT: 10 IU/L (ref 0–32)
AST: 14 IU/L (ref 0–40)
Albumin/Globulin Ratio: 1.4 (ref 1.2–2.2)
Albumin: 3.9 g/dL (ref 3.8–4.9)
Alkaline Phosphatase: 81 IU/L (ref 44–121)
BUN/Creatinine Ratio: 19 (ref 9–23)
BUN: 19 mg/dL (ref 6–24)
Bilirubin Total: 0.3 mg/dL (ref 0.0–1.2)
CO2: 26 mmol/L (ref 20–29)
Calcium: 9.6 mg/dL (ref 8.7–10.2)
Chloride: 100 mmol/L (ref 96–106)
Creatinine, Ser: 0.99 mg/dL (ref 0.57–1.00)
Globulin, Total: 2.8 g/dL (ref 1.5–4.5)
Glucose: 132 mg/dL — ABNORMAL HIGH (ref 65–99)
Potassium: 4.8 mmol/L (ref 3.5–5.2)
Sodium: 143 mmol/L (ref 134–144)
Total Protein: 6.7 g/dL (ref 6.0–8.5)
eGFR: 67 mL/min/{1.73_m2} (ref >59)

## 2021-07-20 LAB — TSH: TSH: 1.65 u[IU]/mL (ref 0.450–4.500)

## 2021-07-23 ENCOUNTER — Encounter: Payer: Self-pay | Admitting: Family Medicine

## 2021-07-26 ENCOUNTER — Other Ambulatory Visit: Payer: Self-pay | Admitting: Family Medicine

## 2021-07-26 MED ORDER — TIZANIDINE HCL 4 MG PO TABS: 4.0000 mg | ORAL_TABLET | Freq: Four times a day (QID) | ORAL | 0 refills | Status: DC | PRN

## 2021-07-26 NOTE — Telephone Encounter (Signed)
Pt calling requesting muscle relaxer due to pain.   Lorita Officer, CCMA 07/26/21 9:22 AM

## 2021-08-16 ENCOUNTER — Ambulatory Visit (INDEPENDENT_AMBULATORY_CARE_PROVIDER_SITE_OTHER): Payer: Medicare HMO | Admitting: Family Medicine

## 2021-08-16 ENCOUNTER — Other Ambulatory Visit: Payer: Self-pay

## 2021-08-16 ENCOUNTER — Encounter: Payer: Self-pay | Admitting: Family Medicine

## 2021-08-16 VITALS — BP 110/60 | HR 88 | Temp 97.3°F | Resp 18 | Ht 63.0 in | Wt 256.0 lb

## 2021-08-16 DIAGNOSIS — R600 Localized edema: Secondary | ICD-10-CM

## 2021-08-16 DIAGNOSIS — E782 Mixed hyperlipidemia: Secondary | ICD-10-CM | POA: Diagnosis not present

## 2021-08-16 DIAGNOSIS — Z6841 Body Mass Index (BMI) 40.0 and over, adult: Secondary | ICD-10-CM | POA: Diagnosis not present

## 2021-08-16 DIAGNOSIS — E875 Hyperkalemia: Secondary | ICD-10-CM | POA: Diagnosis not present

## 2021-08-16 DIAGNOSIS — Z23 Encounter for immunization: Secondary | ICD-10-CM | POA: Diagnosis not present

## 2021-08-16 DIAGNOSIS — R7309 Other abnormal glucose: Secondary | ICD-10-CM | POA: Diagnosis not present

## 2021-08-16 NOTE — Progress Notes (Signed)
Acute Office Visit  Subjective:    Patient ID: Cynthia [name], female    DOB: [birth year], 60 y.o.   MRN: [id number]  Chief Complaint  Patient presents with   pedal edema    HPI Patient is in today for follow up of edema. I increased lasix 40 mg to twice a day. She has started wearing compression socks which have helped. She is complaining of itching of her legs.  Pt returned fasting. She has been decreasing her sugar in diet. His sugar was non fasting, but elevated on her last CMP.  Pt has a history of high cholesterol. Has not been checked since last summer.  Past Medical History:  Diagnosis Date   Bipolar 1 disorder ([place])    Essential hypertension    GERD (gastroesophageal reflux disease)     No past surgical history on file.  Family History  Problem Relation Age of Onset   Cancer Mother        lung   Cancer Father        prostate   Diabetes Paternal Grandfather     Social History   Socioeconomic History   Marital status: Divorced    Spouse name: Not on file   Number of children: 3   Years of education: Not on file   Highest education level: Not on file  Occupational History   Not on file  Tobacco Use   Smoking status: Never   Smokeless tobacco: Never  Substance and Sexual Activity   Alcohol use: Not Currently    Comment: rarely   Drug use: Never   Sexual activity: Not on file  Other Topics Concern   Not on file  Social History Narrative   Not on file   Social Determinants of Health   Financial Resource Strain: Low Risk    Difficulty of Paying Living Expenses: Not hard at all  Food Insecurity: No Food Insecurity   Worried About Charity fundraiser in the Last Year: Never true   [place] in the Last Year: Never true  Transportation Needs: No Transportation Needs   Lack of Transportation (Medical): No   Lack of Transportation (Non-Medical): No  Physical Activity: Inactive   Days of Exercise per Week: 0 days   Minutes of Exercise per  Session: 0 min  Stress: No Stress Concern Present   Feeling of Stress : Not at all  Social Connections: Socially Isolated   Frequency of Communication with Friends and Family: More than three times a week   Frequency of Social Gatherings with Friends and Family: More than three times a week   Attends Religious Services: Never   Marine scientist or Organizations: No   Attends Music therapist: Never   Marital Status: Divorced  Human resources officer Violence: Not At Risk   Fear of Current or Ex-Partner: No   Emotionally Abused: No   Physically Abused: No   Sexually Abused: No    Outpatient Medications Prior to Visit  Medication Sig Dispense Refill   albuterol (VENTOLIN HFA) 108 (90 Base) MCG/ACT inhaler Inhale 1-2 puffs into the lungs every 4 (four) hours as needed for wheezing or shortness of breath.     buPROPion (WELLBUTRIN XL) 150 MG 24 hr tablet Take 1 tablet (150 mg total) by mouth daily. For depression 30 tablet 0   divalproex (DEPAKOTE) 500 MG DR tablet Take 1 tablet (500 mg total) by mouth every 12 (twelve) hours. For mood stabilization 60 tablet  0   furosemide (LASIX) 40 MG tablet Take 1 tablet (40 mg total) by mouth 2 (two) times daily. 60 tablet 1   hydrOXYzine (ATARAX/VISTARIL) 10 MG tablet Take 2 tablets (20 mg total) by mouth 3 (three) times daily as needed for anxiety. 60 tablet 0   ibuprofen (ADVIL) 600 MG tablet Take 1 tablet (600 mg total) by mouth every 6 (six) hours as needed for mild pain or moderate pain. 30 tablet 0   melatonin 3 MG TABS tablet Take 1 tablet (3 mg total) by mouth at bedtime. For sleep 30 tablet 0   tiZANidine (ZANAFLEX) 4 MG tablet Take 1 tablet (4 mg total) by mouth every 6 (six) hours as needed for muscle spasms. 50 tablet 0   No facility-administered medications prior to visit.    No Known Allergies  Review of Systems  Constitutional:  Negative for chills, fatigue and fever.  HENT:  Negative for congestion, ear pain,  rhinorrhea and sore throat.   Eyes:  Positive for visual disturbance (needs new glasses.).  Respiratory:  Negative for cough and shortness of breath.   Cardiovascular:  Negative for chest pain.  Gastrointestinal:  Negative for abdominal pain, constipation, diarrhea, nausea and vomiting.  Genitourinary:  Negative for dysuria and urgency.  Musculoskeletal:  Positive for arthralgias. Negative for back pain and myalgias.  Neurological:  Positive for weakness. Negative for dizziness, light-headedness and headaches.  Psychiatric/Behavioral:  Negative for dysphoric mood. The patient is not nervous/anxious.       Objective:    Physical Exam Vitals reviewed.  Constitutional:      Appearance: Normal appearance. She is obese.  Neck:     Vascular: No carotid bruit.  Cardiovascular:     Rate and Rhythm: Normal rate and regular rhythm.     Heart sounds: Normal heart sounds.  Pulmonary:     Effort: Pulmonary effort is normal. No respiratory distress.     Breath sounds: Normal breath sounds.  Skin:    Comments: Scabs and excoriations from scratching on lower legs. No edema now.  No rashes. Dry skin  Neurological:     Mental Status: She is alert and oriented to person, place, and time.  Psychiatric:        Mood and Affect: Mood normal.        Behavior: Behavior normal.    BP 110/60   Pulse 88   Temp (!) 97.3 F (36.3 C)   Resp 18   Ht _0  (1.6 m)   Wt 256 lb (116.1 kg)   BMI 45.35 kg/m  Wt Readings from Last 3 Encounters:  08/18/21 256 lb (116.1 kg)  08/16/21 256 lb (116.1 kg)  07/19/21 260 lb (117.9 kg)    Health Maintenance Due  Topic Date Due   COVID-19 Vaccine (1) Never done   HIV Screening  Never done   Hepatitis C Screening  Never done   TETANUS/TDAP  Never done   PAP SMEAR-Modifier  Never done    There are no preventive care reminders to display for this patient.   Lab Results  Component Value Date   TSH 1.650 07/19/2021   Lab Results  Component Value Date    WBC 5.5 08/16/2021   HGB 9.1 (L) 08/16/2021   HCT 28.7 (L) 08/16/2021   MCV 83 08/16/2021   PLT 413 08/16/2021   Lab Results  Component Value Date   NA 141 08/16/2021   K 4.1 08/16/2021   CO2 26 08/16/2021   GLUCOSE 107 (  H) 08/16/2021   BUN 21 08/16/2021   CREATININE 0.91 08/16/2021   BILITOT 0.4 08/16/2021   ALKPHOS 63 08/16/2021   AST 14 08/16/2021   ALT 13 08/16/2021   PROT 6.7 08/16/2021   ALBUMIN 3.9 08/16/2021   CALCIUM 9.0 08/16/2021   ANIONGAP 12 08/28/2020   EGFR 74 08/16/2021   Lab Results  Component Value Date   CHOL 200 (H) 08/16/2021   Lab Results  Component Value Date   HDL 44 08/16/2021   Lab Results  Component Value Date   LDLCALC 118 (H) 08/16/2021   Lab Results  Component Value Date   TRIG 215 (H) 08/16/2021   Lab Results  Component Value Date   CHOLHDL 4.5 (H) 08/16/2021   Lab Results  Component Value Date   HGBA1C 6.0 (H) 08/16/2021       Assessment & Plan:   Problem List Items Addressed This Visit       Other   Mixed hyperlipidemia    HIGH TRIGS AND LDL. Recommend continue to work on eating healthy diet and exercise. Recommend pravastatin 20 mg qd and vascepa 1 gm 2 caps twice daily.       Relevant Orders   CBC with Differential/Platelet (Completed)   Lipid panel (Completed)   Morbid obesity with body mass index (BMI) of 45.0 to 49.9 in adult [facility])    Recommend continue to work on eating healthy diet and exercise.       Pedal edema - Primary    IMPROVED.  CONTINUE LASIX.  CONTINUE COMPRESSION SOCKS.      Relevant Orders   Hemoglobin A1c (Completed)   Elevated glucose    A1C CAME BACK C/W PREDIABETES.  CONTINUE TO DECREASE SUGAR/CARBS IN DIET.       Hyperkalemia    CHECK CMP      Relevant Orders   Comprehensive metabolic panel (Completed)   Other Visit Diagnoses     Needs flu shot       Relevant Orders   Flu Vaccine MDCK QUAD PF (Completed)        Follow-up: Return in about 3 months (around  11/15/2021) for chronic fasting.  An After Visit Summary was printed and given to the patient.  Rochel Brome, MD [facility] [phone]

## 2021-08-17 LAB — CBC WITH DIFFERENTIAL/PLATELET
Basophils Absolute: 0.1 10*3/uL (ref 0.0–0.2)
Basos: 2 %
EOS (ABSOLUTE): 0.2 10*3/uL (ref 0.0–0.4)
Eos: 3 %
Hematocrit: 28.7 % — ABNORMAL LOW (ref 34.0–46.6)
Hemoglobin: 9.1 g/dL — ABNORMAL LOW (ref 11.1–15.9)
Immature Grans (Abs): 0 10*3/uL (ref 0.0–0.1)
Immature Granulocytes: 0 %
Lymphocytes Absolute: 1.6 10*3/uL (ref 0.7–3.1)
Lymphs: 29 %
MCH: 26.3 pg — ABNORMAL LOW (ref 26.6–33.0)
MCHC: 31.7 g/dL (ref 31.5–35.7)
MCV: 83 fL (ref 79–97)
Monocytes Absolute: 0.4 10*3/uL (ref 0.1–0.9)
Monocytes: 8 %
Neutrophils Absolute: 3.2 10*3/uL (ref 1.4–7.0)
Neutrophils: 58 %
Platelets: 413 10*3/uL (ref 150–450)
RBC: 3.46 x10E6/uL — ABNORMAL LOW (ref 3.77–5.28)
RDW: 14.1 % (ref 11.7–15.4)
WBC: 5.5 10*3/uL (ref 3.4–10.8)

## 2021-08-17 LAB — COMPREHENSIVE METABOLIC PANEL
ALT: 13 IU/L (ref 0–32)
AST: 14 IU/L (ref 0–40)
Albumin/Globulin Ratio: 1.4 (ref 1.2–2.2)
Albumin: 3.9 g/dL (ref 3.8–4.9)
Alkaline Phosphatase: 63 IU/L (ref 44–121)
BUN/Creatinine Ratio: 23 (ref 9–23)
BUN: 21 mg/dL (ref 6–24)
Bilirubin Total: 0.4 mg/dL (ref 0.0–1.2)
CO2: 26 mmol/L (ref 20–29)
Calcium: 9 mg/dL (ref 8.7–10.2)
Chloride: 102 mmol/L (ref 96–106)
Creatinine, Ser: 0.91 mg/dL (ref 0.57–1.00)
Globulin, Total: 2.8 g/dL (ref 1.5–4.5)
Glucose: 107 mg/dL — ABNORMAL HIGH (ref 65–99)
Potassium: 4.1 mmol/L (ref 3.5–5.2)
Sodium: 141 mmol/L (ref 134–144)
Total Protein: 6.7 g/dL (ref 6.0–8.5)
eGFR: 74 mL/min/{1.73_m2} (ref >59)

## 2021-08-17 LAB — LIPID PANEL
Chol/HDL Ratio: 4.5 ratio — ABNORMAL HIGH (ref 0.0–4.4)
Cholesterol, Total: 200 mg/dL — ABNORMAL HIGH (ref 100–199)
HDL: 44 mg/dL (ref >39)
LDL Chol Calc (NIH): 118 mg/dL — ABNORMAL HIGH (ref 0–99)
Triglycerides: 215 mg/dL — ABNORMAL HIGH (ref 0–149)
VLDL Cholesterol Cal: 38 mg/dL (ref 5–40)

## 2021-08-17 LAB — HEMOGLOBIN A1C
Est. average glucose Bld gHb Est-mCnc: 126 mg/dL
Hgb A1c MFr Bld: 6 % — ABNORMAL HIGH (ref 4.8–5.6)

## 2021-08-17 LAB — CARDIOVASCULAR RISK ASSESSMENT

## 2021-08-18 ENCOUNTER — Encounter: Payer: Self-pay | Admitting: Family Medicine

## 2021-08-18 ENCOUNTER — Ambulatory Visit (INDEPENDENT_AMBULATORY_CARE_PROVIDER_SITE_OTHER): Payer: Medicare HMO

## 2021-08-18 VITALS — Ht 63.0 in | Wt 256.0 lb

## 2021-08-18 DIAGNOSIS — Z0000 Encounter for general adult medical examination without abnormal findings: Secondary | ICD-10-CM | POA: Diagnosis not present

## 2021-08-18 DIAGNOSIS — E875 Hyperkalemia: Secondary | ICD-10-CM | POA: Insufficient documentation

## 2021-08-18 DIAGNOSIS — R7309 Other abnormal glucose: Secondary | ICD-10-CM | POA: Insufficient documentation

## 2021-08-18 NOTE — Assessment & Plan Note (Signed)
CHECK CMP

## 2021-08-18 NOTE — Assessment & Plan Note (Signed)
HIGH TRIGS AND LDL. Recommend continue to work on eating healthy diet and exercise. Recommend pravastatin 20 mg qd and vascepa 1 gm 2 caps twice daily.

## 2021-08-18 NOTE — Assessment & Plan Note (Signed)
Recommend continue to work on eating healthy diet and exercise.  

## 2021-08-18 NOTE — Assessment & Plan Note (Signed)
IMPROVED.  CONTINUE LASIX.  CONTINUE COMPRESSION SOCKS.

## 2021-08-18 NOTE — Assessment & Plan Note (Signed)
A1C CAME BACK C/W PREDIABETES.  CONTINUE TO DECREASE SUGAR/CARBS IN DIET.

## 2021-08-18 NOTE — Progress Notes (Signed)
Subjective:   Cynthia [name] is a 60 y.o. female who presents for an Initial Medicare Annual Wellness Visit.  I connected with  Disa Riedlinger Csaszar on 08/18/21 by a Audio enabled telemedicine application and verified that I am speaking with the correct person using two identifiers.   I discussed the limitations of evaluation and management by telemedicine. The patient expressed understanding and agreed to proceed.   Location of Patient: Home  Location of provider: Home  Persons participating in visit: Cynthia [name] (patient) and Gwynneth Aliment, CMA. Review of Systems    Defer to PCP Cardiac Risk Factors include: advanced age (>89men, >63 women)     Objective:    Today's Vitals   08/18/21 0830  Weight: 256 lb (116.1 kg)  Height: 5\' 3"  (1.6 m)   Body mass index is 45.35 kg/m.  Advanced Directives 08/18/2021 08/26/2020 07/04/2019  Does Patient Have a Medical Advance Directive? Yes No Unable to assess, patient is non-responsive or altered mental status  Type of Advance Directive Living will - -  Does patient want to make changes to medical advance directive? No - Patient declined - -  Would patient like information on creating a medical advance directive? - No - Patient declined -  Some encounter information is confidential and restricted. Go to Review Flowsheets activity to see all data.    Current Medications (verified) Outpatient Encounter Medications as of 08/18/2021  Medication Sig   albuterol (VENTOLIN HFA) 108 (90 Base) MCG/ACT inhaler Inhale 1-2 puffs into the lungs every 4 (four) hours as needed for wheezing or shortness of breath.   buPROPion (WELLBUTRIN XL) 150 MG 24 hr tablet Take 1 tablet (150 mg total) by mouth daily. For depression   divalproex (DEPAKOTE) 500 MG DR tablet Take 1 tablet (500 mg total) by mouth every 12 (twelve) hours. For mood stabilization   furosemide (LASIX) 40 MG tablet Take 1 tablet (40 mg total) by mouth 2 (two) times daily.   hydrOXYzine  (ATARAX/VISTARIL) 10 MG tablet Take 2 tablets (20 mg total) by mouth 3 (three) times daily as needed for anxiety.   ibuprofen (ADVIL) 600 MG tablet Take 1 tablet (600 mg total) by mouth every 6 (six) hours as needed for mild pain or moderate pain.   melatonin 3 MG TABS tablet Take 1 tablet (3 mg total) by mouth at bedtime. For sleep   tiZANidine (ZANAFLEX) 4 MG tablet Take 1 tablet (4 mg total) by mouth every 6 (six) hours as needed for muscle spasms.   No facility-administered encounter medications on file as of 08/18/2021.    Allergies (verified) Patient has no known allergies.   History: Past Medical History:  Diagnosis Date   Bipolar 1 disorder (HCC)    Essential hypertension    GERD (gastroesophageal reflux disease)    No past surgical history on file. Family History  Problem Relation Age of Onset   Cancer Mother        lung   Cancer Father        prostate   Diabetes Paternal Grandfather    Social History   Socioeconomic History   Marital status: Divorced    Spouse name: Not on file   Number of children: 3   Years of education: Not on file   Highest education level: Not on file  Occupational History   Not on file  Tobacco Use   Smoking status: Never   Smokeless tobacco: Never  Substance and Sexual Activity   Alcohol use: Not  Currently    Comment: rarely   Drug use: Never   Sexual activity: Not on file  Other Topics Concern   Not on file  Social History Narrative   Not on file   Social Determinants of Health   Financial Resource Strain: Low Risk    Difficulty of Paying Living Expenses: Not hard at all  Food Insecurity: No Food Insecurity   Worried About Running Out of Food in the Last Year: Never true   Ran Out of Food in the Last Year: Never true  Transportation Needs: No Transportation Needs   Lack of Transportation (Medical): No   Lack of Transportation (Non-Medical): No  Physical Activity: Inactive   Days of Exercise per Week: 0 days   Minutes of  Exercise per Session: 0 min  Stress: No Stress Concern Present   Feeling of Stress : Not at all  Social Connections: Socially Isolated   Frequency of Communication with Friends and Family: More than three times a week   Frequency of Social Gatherings with Friends and Family: More than three times a week   Attends Religious Services: Never   Database administrator or Organizations: No   Attends Engineer, structural: Never   Marital Status: Divorced    Tobacco Counseling Counseling given: Not Answered   Clinical Intake:  Pre-visit preparation completed: Yes  Pain : No/denies pain     BMI - recorded: 45.36 Nutritional Status: BMI > 30  Obese Nutritional Risks: None Diabetes: No  How often do you need to have someone help you when you read instructions, pamphlets, or other written materials from your doctor or pharmacy?: 1 - Never What is the last grade level you completed in school?: Some College  Diabetic?NO  Interpreter Needed?: No  Information entered by :: Gwynneth Aliment   Activities of Daily Living In your present state of health, do you have any difficulty performing the following activities: 08/18/2021  Hearing? N  Vision? N  Difficulty concentrating or making decisions? N  Walking or climbing stairs? N  Dressing or bathing? N  Doing errands, shopping? N  Preparing Food and eating ? N  Using the Toilet? N  In the past six months, have you accidently leaked urine? N  Do you have problems with loss of bowel control? N  Managing your Medications? N  Managing your Finances? N  Housekeeping or managing your Housekeeping? N  Some encounter information is confidential and restricted. Go to Review Flowsheets activity to see all data.  Some recent data might be hidden    Patient Care Team: Blane Ohara, MD as PCP - General (Family Medicine) Albertina Parr (Nurse Practitioner)  Indicate any recent Medical Services you may have received from other than  [facility] providers in the past year (date may be approximate).     Assessment:   This is a routine wellness examination for [place].  Hearing/Vision screen No results found.  Dietary issues and exercise activities discussed: Current Exercise Habits: The patient does not participate in regular exercise at present, Exercise limited by: Other - see comments (Walking with a walker at moment.)   Goals Addressed   None   Depression Screen PHQ 2/9 Scores 08/18/2021 07/19/2021 10/22/2020 08/22/2020  PHQ - 2 Score 0 0 6 6  PHQ- 9 Score - 6 18 22     Fall Risk Fall Risk  08/18/2021 07/19/2021  Falls in the past year? 0 0  Number falls in past yr: 0 0  Injury with  Fall? 0 0  Risk for fall due to : Impaired mobility No Fall Risks  Follow up Education provided Falls evaluation completed    FALL RISK PREVENTION PERTAINING TO THE HOME:  Any stairs in or around the home? Yes  If so, are there any without handrails? Yes  Home free of loose throw rugs in walkways, pet beds, electrical cords, etc? YES Adequate lighting in your home to reduce risk of falls? Yes   ASSISTIVE DEVICES UTILIZED TO PREVENT FALLS:  Life alert? No  Use of a cane, walker or w/c? Yes  Grab bars in the bathroom? No  Shower chair or bench in shower? No  Elevated toilet seat or a handicapped toilet? Yes   TIMED UP AND GO:  Was the test performed?  N/A .  Length of time to ambulate 10 feet: N/A sec.     Cognitive Function:     6CIT Screen 08/18/2021  What Year? 0 points  What month? 0 points  What time? 0 points  Count back from 20 0 points  Months in reverse 0 points  Repeat phrase 0 points  Total Score 0    Immunizations Immunization History  Administered Date(s) Administered   Influenza Inj Mdck Quad Pf 08/22/2020, 08/16/2021   Influenza-Unspecified 08/22/2019    TDAP status: Due, Education has been provided regarding the importance of this vaccine. Advised may receive this vaccine at local pharmacy or  Health Dept. Aware to provide a copy of the vaccination record if obtained from local pharmacy or Health Dept. Verbalized acceptance and understanding.  Flu Vaccine status: Up to date  Pneumococcal vaccine status: Declined,  Education has been provided regarding the importance of this vaccine but patient still declined. Advised may receive this vaccine at local pharmacy or Health Dept. Aware to provide a copy of the vaccination record if obtained from local pharmacy or Health Dept. Verbalized acceptance and understanding.   Covid-19 vaccine status: Completed vaccines  Qualifies for Shingles Vaccine?  No   Zostavax completed  N/A   Shingrix Completed?: No.    Education has been provided regarding the importance of this vaccine. Patient has been advised to call insurance company to determine out of pocket expense if they have not yet received this vaccine. Advised may also receive vaccine at local pharmacy or Health Dept. Verbalized acceptance and understanding.  Screening Tests Health Maintenance  Topic Date Due   COVID-19 Vaccine (1) Never done   HIV Screening  Never done   Hepatitis C Screening  Never done   TETANUS/TDAP  Never done   PAP SMEAR-Modifier  Never done   Zoster Vaccines- Shingrix (1 of 2) 11/17/2021 (Originally 11/03/2013)   MAMMOGRAM  08/18/2022 (Originally 01/26/2020)   COLONOSCOPY (Pts 45-618yrs Insurance coverage will need to be confirmed)  04/05/2025   INFLUENZA VACCINE  Completed   Pneumococcal Vaccine 490-258 Years old  Aged Out   HPV VACCINES  Aged Out    Health Maintenance  Health Maintenance Due  Topic Date Due   COVID-19 Vaccine (1) Never done   HIV Screening  Never done   Hepatitis C Screening  Never done   TETANUS/TDAP  Never done   PAP SMEAR-Modifier  Never done    Colorectal cancer screening: Type of screening: Colonoscopy. Completed 04/06/2015. Repeat every 10 years  Mammogram status: Ordered Patient denied. Pt provided with contact info and advised  to call to schedule appt.   N/A  Lung Cancer Screening: (Low Dose CT Chest recommended if Age 83-80 years,  30 pack-year currently smoking OR have quit w/in 15years.) does not qualify.   Lung Cancer Screening Referral: N/A  Additional Screening:  Hepatitis C Screening: does qualify; Completed N/A  Vision Screening: Recommended annual ophthalmology exams for early detection of glaucoma and other disorders of the eye. Is the patient up to date with their annual eye exam?  No  Who is the provider or what is the name of the office in which the patient attends annual eye exams? [facility] in [place] If pt is not established with a provider, would they like to be referred to a provider to establish care?  N/A .   Dental Screening: Recommended annual dental exams for proper oral hygiene  Community Resource Referral / Chronic Care Management: CRR required this visit?  No   CCM required this visit?  No      Plan:     I have personally reviewed and noted the following in the patient's chart:   Medical and social history Use of alcohol, tobacco or illicit drugs  Current medications and supplements including opioid prescriptions. Patient is not currently taking opioid prescriptions. Functional ability and status Nutritional status Physical activity Advanced directives List of other physicians Hospitalizations, surgeries, and ER visits in previous 12 months Vitals Screenings to include cognitive, depression, and falls Referrals and appointments  In addition, I have reviewed and discussed with patient certain preventive protocols, quality metrics, and best practice recommendations. A written personalized care plan for preventive services as well as general preventive health recommendations were provided to patient.     Jomarie Longs, CMA   08/18/2021   Nurse Notes: Non-Face to Face 30 minute visit encounter.  Patient will bring COVID card to next visit, patient need RX for  tetanus to get done at pharmacy, also denied mammogram, agrees to get Hep C screening at her next visit.   Ms. Dedominicis , Thank you for taking time to come for your Medicare Wellness Visit. I appreciate your ongoing commitment to your health goals. Please review the following plan we discussed and let me know if I can assist you in the future.   These are the goals we discussed:  Goals   None     This is a list of the screening recommended for you and due dates:  Health Maintenance  Topic Date Due   COVID-19 Vaccine (1) Never done   HIV Screening  Never done   Hepatitis C Screening: USPSTF Recommendation to screen - Ages 21-79 yo.  Never done   Tetanus Vaccine  Never done   Pap Smear  Never done   Zoster (Shingles) Vaccine (1 of 2) 11/17/2021*   Mammogram  08/18/2022*   Colon Cancer Screening  04/05/2025   Flu Shot  Completed   Pneumococcal Vaccination  Aged Out   HPV Vaccine  Aged Out  *Topic was postponed. The date shown is not the original due date.

## 2021-08-19 ENCOUNTER — Other Ambulatory Visit: Payer: Self-pay

## 2021-08-19 DIAGNOSIS — R799 Abnormal finding of blood chemistry, unspecified: Secondary | ICD-10-CM

## 2021-08-19 MED ORDER — PRAVASTATIN SODIUM 20 MG PO TABS
20.0000 mg | ORAL_TABLET | Freq: Every day | ORAL | 0 refills | Status: DC
Start: 2021-08-19 — End: 2021-11-27

## 2021-08-19 MED ORDER — ICOSAPENT ETHYL 1 G PO CAPS: 2.0000 g | ORAL_CAPSULE | Freq: Two times a day (BID) | ORAL | 0 refills | Status: DC

## 2021-08-20 LAB — IRON AND TIBC
Iron Saturation: 6 % — CL (ref 15–55)
Iron: 24 ug/dL — ABNORMAL LOW (ref 27–159)
Total Iron Binding Capacity: 428 ug/dL (ref 250–450)
UIBC: 404 ug/dL (ref 131–425)

## 2021-08-20 LAB — FERRITIN: Ferritin: 7 ng/mL — ABNORMAL LOW (ref 15–150)

## 2021-08-20 LAB — SPECIMEN STATUS REPORT

## 2021-08-21 ENCOUNTER — Other Ambulatory Visit: Payer: Self-pay

## 2021-08-21 ENCOUNTER — Ambulatory Visit: Payer: Medicare HMO

## 2021-08-21 DIAGNOSIS — R799 Abnormal finding of blood chemistry, unspecified: Secondary | ICD-10-CM | POA: Diagnosis not present

## 2021-08-21 LAB — CBC WITH DIFFERENTIAL/PLATELET
Basophils Absolute: 0.1 10*3/uL (ref 0.0–0.2)
Basos: 1 %
EOS (ABSOLUTE): 0.2 10*3/uL (ref 0.0–0.4)
Eos: 3 %
Hematocrit: 27.9 % — ABNORMAL LOW (ref 34.0–46.6)
Hemoglobin: 8.6 g/dL — ABNORMAL LOW (ref 11.1–15.9)
Immature Grans (Abs): 0 10*3/uL (ref 0.0–0.1)
Immature Granulocytes: 0 %
Lymphocytes Absolute: 2 10*3/uL (ref 0.7–3.1)
Lymphs: 30 %
MCH: 25.7 pg — ABNORMAL LOW (ref 26.6–33.0)
MCHC: 30.8 g/dL — ABNORMAL LOW (ref 31.5–35.7)
MCV: 84 fL (ref 79–97)
Monocytes Absolute: 0.5 10*3/uL (ref 0.1–0.9)
Monocytes: 7 %
Neutrophils Absolute: 3.9 10*3/uL (ref 1.4–7.0)
Neutrophils: 59 %
Platelets: 420 10*3/uL (ref 150–450)
RBC: 3.34 x10E6/uL — ABNORMAL LOW (ref 3.77–5.28)
RDW: 14.1 % (ref 11.7–15.4)
WBC: 6.7 10*3/uL (ref 3.4–10.8)

## 2021-08-22 ENCOUNTER — Encounter: Payer: Self-pay | Admitting: Family Medicine

## 2021-08-26 ENCOUNTER — Ambulatory Visit (INDEPENDENT_AMBULATORY_CARE_PROVIDER_SITE_OTHER): Payer: Medicare HMO

## 2021-08-26 ENCOUNTER — Other Ambulatory Visit: Payer: Self-pay | Admitting: Family Medicine

## 2021-08-26 DIAGNOSIS — D509 Iron deficiency anemia, unspecified: Secondary | ICD-10-CM | POA: Diagnosis not present

## 2021-08-26 LAB — POC HEMOCCULT BLD/STL (HOME/3-CARD/SCREEN)
Card #2 Fecal Occult Blod, POC: NEGATIVE
Card #3 Fecal Occult Blood, POC: NEGATIVE
Fecal Occult Blood, POC: NEGATIVE

## 2021-10-10 ENCOUNTER — Other Ambulatory Visit: Payer: Self-pay | Admitting: Family Medicine

## 2021-10-10 NOTE — Telephone Encounter (Signed)
Refill sent to pharmacy.   

## 2021-10-15 DIAGNOSIS — R69 Illness, unspecified: Secondary | ICD-10-CM | POA: Diagnosis not present

## 2021-10-15 DIAGNOSIS — F3132 Bipolar disorder, current episode depressed, moderate: Secondary | ICD-10-CM | POA: Diagnosis not present

## 2021-11-14 DIAGNOSIS — D500 Iron deficiency anemia secondary to blood loss (chronic): Secondary | ICD-10-CM | POA: Diagnosis not present

## 2021-11-14 DIAGNOSIS — Z1212 Encounter for screening for malignant neoplasm of rectum: Secondary | ICD-10-CM | POA: Diagnosis not present

## 2021-11-21 NOTE — Progress Notes (Signed)
Subjective:  Patient ID: Cynthia [name], female    DOB: 1961/07/27  Age: 60 y.o. MRN: [id number]  Chief Complaint  Patient presents with   Hyperlipidemia    HPI: Hyperlipidemia: Current medications: on pravachol 20 mg daily and vascepa 1 gm 2 capsules twice a day.  Mild persistent asthma: Albuterol prn. Has not had to use It.  Bipolar disorder: well controlled. Pt is functioning well. 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  Depakote 500 mg one bid.  Wellbutrin xl 150 mg once daily.  Sees psychiatry.   Iron deficiency anemia: Patient has had this intermittently since 2016.  At that time she had an EGD and colonoscopy neither which identified a source of her bleeding.  Recent blood count showed her hemoglobin had dropped to 8.6 in September 2022.  At that time I referred her back to Dr. Charm Barges.  She had started on some iron but did not tolerate this.  He recently saw her earlier this month and changed her iron supplement to ferrous gluconate.  If she does not respond to this medicine he would consider repeat endoscopies however at this point he does not feel this is necessary.  She is scheduled to follow-up with his in early 2023.  Current Outpatient Medications on File Prior to Visit  Medication Sig Dispense Refill   albuterol (VENTOLIN HFA) 108 (90 Base) MCG/ACT inhaler Inhale 1-2 puffs into the lungs every 4 (four) hours as needed for wheezing or shortness of breath.     buPROPion (WELLBUTRIN XL) 150 MG 24 hr tablet Take 1 tablet (150 mg total) by mouth daily. For depression 30 tablet 0   divalproex (DEPAKOTE) 500 MG DR tablet Take 1 tablet (500 mg total) by mouth every 12 (twelve) hours. For mood stabilization 60 tablet 0   ferrous gluconate (FERGON) 324 MG tablet Take 324 mg by mouth daily.     furosemide (LASIX) 40 MG tablet Take 1 tablet by mouth twice daily 60 tablet 0   hydrOXYzine (ATARAX/VISTARIL) 10 MG tablet Take 2 tablets (20 mg total) by mouth 3 (three) times daily as needed for  anxiety. 60 tablet 0   ibuprofen (ADVIL) 600 MG tablet Take 1 tablet (600 mg total) by mouth every 6 (six) hours as needed for mild pain or moderate pain. 30 tablet 0   icosapent Ethyl (VASCEPA) 1 g capsule Take 2 capsules (2 g total) by mouth 2 (two) times daily. 360 capsule 0   melatonin 3 MG TABS tablet Take 1 tablet (3 mg total) by mouth at bedtime. For sleep 30 tablet 0   tiZANidine (ZANAFLEX) 4 MG tablet Take 1 tablet (4 mg total) by mouth every 6 (six) hours as needed for muscle spasms. 50 tablet 0   No current facility-administered medications on file prior to visit.   Past Medical History:  Diagnosis Date   Bipolar 1 disorder (HCC)    Essential hypertension    GERD (gastroesophageal reflux disease)    History reviewed. No pertinent surgical history.  Family History  Problem Relation Age of Onset   Cancer Mother        lung   Cancer Father        prostate   Diabetes Paternal Grandfather    Social History   Socioeconomic History   Marital status: Divorced    Spouse name: Not on file   Number of children: 3   Years of education: Not on file   Highest education level: Not on file  Occupational History   Not on  file  Tobacco Use   Smoking status: Never   Smokeless tobacco: Never  Substance and Sexual Activity   Alcohol use: Not Currently    Comment: rarely   Drug use: Never   Sexual activity: Not on file  Other Topics Concern   Not on file  Social History Narrative   Not on file   Social Determinants of Health   Financial Resource Strain: Low Risk    Difficulty of Paying Living Expenses: Not hard at all  Food Insecurity: No Food Insecurity   Worried About Programme researcher, broadcasting/film/video in the Last Year: Never true   Ran Out of Food in the Last Year: Never true  Transportation Needs: No Transportation Needs   Lack of Transportation (Medical): No   Lack of Transportation (Non-Medical): No  Physical Activity: Inactive   Days of Exercise per Week: 0 days   Minutes of  Exercise per Session: 0 min  Stress: No Stress Concern Present   Feeling of Stress : Not at all  Social Connections: Socially Isolated   Frequency of Communication with Friends and Family: More than three times a week   Frequency of Social Gatherings with Friends and Family: More than three times a week   Attends Religious Services: Never   Database administrator or Organizations: No   Attends Banker Meetings: Never   Marital Status: Divorced    Review of Systems  Constitutional:  Negative for chills, fatigue and fever.  HENT:  Negative for congestion, ear pain and sore throat.   Respiratory:  Negative for cough and shortness of breath.   Cardiovascular:  Negative for chest pain and palpitations.  Gastrointestinal:  Negative for abdominal pain, constipation, diarrhea, nausea and vomiting.  Endocrine: Negative for polydipsia, polyphagia and polyuria.  Genitourinary:  Negative for difficulty urinating and dysuria.  Musculoskeletal:  Negative for arthralgias, back pain and myalgias.  Skin:  Negative for rash.  Neurological:  Negative for headaches.  Psychiatric/Behavioral:  Negative for dysphoric mood. The patient is not nervous/anxious.     Objective:  BP 124/78    Pulse 84    Temp (!) 97.5 F (36.4 C)    Resp 18    Ht 5\' 3"  (1.6 m)    Wt 256 lb (116.1 kg)    BMI 45.35 kg/m   BP/Weight 11/22/2021 08/18/2021 08/16/2021  Systolic BP 124 - 110  Diastolic BP 78 - 60  Wt. (Lbs) 256 256 256  BMI 45.35 45.35 45.35  Some encounter information is confidential and restricted. Go to Review Flowsheets activity to see all data.    Physical Exam Vitals reviewed.  Constitutional:      Appearance: Normal appearance. She is normal weight.  Neck:     Vascular: No carotid bruit.  Cardiovascular:     Rate and Rhythm: Normal rate and regular rhythm.     Heart sounds: Normal heart sounds.  Pulmonary:     Effort: Pulmonary effort is normal. No respiratory distress.     Breath  sounds: Normal breath sounds.  Abdominal:     General: Abdomen is flat. Bowel sounds are normal.     Palpations: Abdomen is soft.     Tenderness: There is no abdominal tenderness.  Neurological:     Mental Status: She is alert and oriented to person, place, and time.  Psychiatric:        Mood and Affect: Mood normal.        Behavior: Behavior normal.    Diabetic  Foot Exam - Simple   No data filed      Lab Results  Component Value Date   WBC 5.8 11/22/2021   HGB 11.3 11/22/2021   HCT 35.0 11/22/2021   PLT 356 11/22/2021   GLUCOSE 106 (H) 11/22/2021   CHOL 202 (H) 11/22/2021   TRIG 203 (H) 11/22/2021   HDL 47 11/22/2021   LDLCALC 119 (H) 11/22/2021   ALT 8 11/22/2021   AST 14 11/22/2021   NA 142 11/22/2021   K 4.6 11/22/2021   CL 102 11/22/2021   CREATININE 0.84 11/22/2021   BUN 10 11/22/2021   CO2 28 11/22/2021   TSH 0.747 11/22/2021   INR 1.0 07/03/2019   HGBA1C 5.9 (H) 11/22/2021      Assessment & Plan:   Problem List Items Addressed This Visit       Cardiovascular and Mediastinum   Essential hypertension, benign    Well controlled.  No changes to medicines.  Continue to work on eating a healthy diet and exercise.  Labs drawn today.         Respiratory   Mild persistent asthma without complication - Primary    The current medical regimen is effective;  continue present plan and medications.        Other   Mixed hyperlipidemia    Not at goal. Change pravastatin 20 mg to rosuvastatin 20 mg daily.  Continue vascepa.  Continue to work on eating a healthy diet and exercise.  Labs drawn today.       Relevant Orders   Comprehensive metabolic panel (Completed)   Hemoglobin A1c (Completed)   Lipid panel (Completed)   CBC with Differential/Platelet (Completed)   TSH (Completed)   Bipolar I disorder, most recent episode (or current) depressed (HCC)    Management per specialist.  The current medical regimen is effective;  continue present plan and  medications. Pt is much better than at past appointments.       RESOLVED: Other iron deficiency anemias    Recheck cbc.  Continue ferrous gluconate.  Keep follow up with Dr. Charm Barges.       Relevant Medications   ferrous gluconate (FERGON) 324 MG tablet   Prediabetes    Recommend continue to work on eating healthy diet and exercise.      Other Visit Diagnoses     Need for pneumococcal vaccination       Relevant Orders   Pneumococcal conjugate vaccine 20-valent (Completed)     .  No orders of the defined types were placed in this encounter.    Orders Placed This Encounter  Procedures   Pneumococcal conjugate vaccine 20-valent   Comprehensive metabolic panel   Hemoglobin A1c   Lipid panel   CBC with Differential/Platelet   TSH   Iron, TIBC and Ferritin Panel   Cardiovascular Risk Assessment   I,Verne Cove,acting as a scribe for Blane Ohara, MD.,have documented all relevant documentation on the behalf of Blane Ohara, MD,as directed by  Blane Ohara, MD while in the presence of Blane Ohara, MD.    Follow-up: Return in about 6 months (around 05/23/2022) for chronic fasting.  An After Visit Summary was printed and given to the patient.  Blane Ohara, MD [facility] [phone]

## 2021-11-22 ENCOUNTER — Ambulatory Visit (INDEPENDENT_AMBULATORY_CARE_PROVIDER_SITE_OTHER): Payer: Medicare HMO | Admitting: Family Medicine

## 2021-11-22 ENCOUNTER — Other Ambulatory Visit: Payer: Self-pay

## 2021-11-22 ENCOUNTER — Ambulatory Visit (INDEPENDENT_AMBULATORY_CARE_PROVIDER_SITE_OTHER): Payer: Medicare HMO

## 2021-11-22 ENCOUNTER — Encounter: Payer: Self-pay | Admitting: Family Medicine

## 2021-11-22 VITALS — BP 124/78 | HR 84 | Temp 97.5°F | Resp 18 | Ht 63.0 in | Wt 256.0 lb

## 2021-11-22 DIAGNOSIS — J4530 Mild persistent asthma, uncomplicated: Secondary | ICD-10-CM | POA: Diagnosis not present

## 2021-11-22 DIAGNOSIS — D649 Anemia, unspecified: Secondary | ICD-10-CM | POA: Insufficient documentation

## 2021-11-22 DIAGNOSIS — E782 Mixed hyperlipidemia: Secondary | ICD-10-CM

## 2021-11-22 DIAGNOSIS — D508 Other iron deficiency anemias: Secondary | ICD-10-CM

## 2021-11-22 DIAGNOSIS — R7303 Prediabetes: Secondary | ICD-10-CM | POA: Diagnosis not present

## 2021-11-22 DIAGNOSIS — F3130 Bipolar disorder, current episode depressed, mild or moderate severity, unspecified: Secondary | ICD-10-CM | POA: Diagnosis not present

## 2021-11-22 DIAGNOSIS — Z23 Encounter for immunization: Secondary | ICD-10-CM

## 2021-11-22 DIAGNOSIS — I10 Essential (primary) hypertension: Secondary | ICD-10-CM | POA: Diagnosis not present

## 2021-11-22 DIAGNOSIS — R69 Illness, unspecified: Secondary | ICD-10-CM | POA: Diagnosis not present

## 2021-11-22 NOTE — Patient Instructions (Signed)
Recommend continue to work on eating healthy diet and exercise. No changes to medicines at this time.

## 2021-11-23 ENCOUNTER — Encounter: Payer: Self-pay | Admitting: Family Medicine

## 2021-11-23 LAB — COMPREHENSIVE METABOLIC PANEL
ALT: 8 IU/L (ref 0–32)
AST: 14 IU/L (ref 0–40)
Albumin/Globulin Ratio: 1.3 (ref 1.2–2.2)
Albumin: 3.8 g/dL (ref 3.8–4.9)
Alkaline Phosphatase: 70 IU/L (ref 44–121)
BUN/Creatinine Ratio: 12 (ref 9–23)
BUN: 10 mg/dL (ref 6–24)
Bilirubin Total: 0.5 mg/dL (ref 0.0–1.2)
CO2: 28 mmol/L (ref 20–29)
Calcium: 9.3 mg/dL (ref 8.7–10.2)
Chloride: 102 mmol/L (ref 96–106)
Creatinine, Ser: 0.84 mg/dL (ref 0.57–1.00)
Globulin, Total: 2.9 g/dL (ref 1.5–4.5)
Glucose: 106 mg/dL — ABNORMAL HIGH (ref 70–99)
Potassium: 4.6 mmol/L (ref 3.5–5.2)
Sodium: 142 mmol/L (ref 134–144)
Total Protein: 6.7 g/dL (ref 6.0–8.5)
eGFR: 80 mL/min/{1.73_m2} (ref >59)

## 2021-11-23 LAB — CBC WITH DIFFERENTIAL/PLATELET
Basophils Absolute: 0.1 10*3/uL (ref 0.0–0.2)
Basos: 1 %
EOS (ABSOLUTE): 0.1 10*3/uL (ref 0.0–0.4)
Eos: 2 %
Hematocrit: 35 % (ref 34.0–46.6)
Hemoglobin: 11.3 g/dL (ref 11.1–15.9)
Immature Grans (Abs): 0 10*3/uL (ref 0.0–0.1)
Immature Granulocytes: 0 %
Lymphocytes Absolute: 1.8 10*3/uL (ref 0.7–3.1)
Lymphs: 31 %
MCH: 27 pg (ref 26.6–33.0)
MCHC: 32.3 g/dL (ref 31.5–35.7)
MCV: 84 fL (ref 79–97)
Monocytes Absolute: 0.4 10*3/uL (ref 0.1–0.9)
Monocytes: 7 %
Neutrophils Absolute: 3.5 10*3/uL (ref 1.4–7.0)
Neutrophils: 59 %
Platelets: 356 10*3/uL (ref 150–450)
RBC: 4.19 x10E6/uL (ref 3.77–5.28)
RDW: 15.9 % — ABNORMAL HIGH (ref 11.7–15.4)
WBC: 5.8 10*3/uL (ref 3.4–10.8)

## 2021-11-23 LAB — IRON,TIBC AND FERRITIN PANEL
Ferritin: 15 ng/mL (ref 15–150)
Iron Saturation: 10 % — ABNORMAL LOW (ref 15–55)
Iron: 38 ug/dL (ref 27–159)
Total Iron Binding Capacity: 376 ug/dL (ref 250–450)
UIBC: 338 ug/dL (ref 131–425)

## 2021-11-23 LAB — HEMOGLOBIN A1C
Est. average glucose Bld gHb Est-mCnc: 123 mg/dL
Hgb A1c MFr Bld: 5.9 % — ABNORMAL HIGH (ref 4.8–5.6)

## 2021-11-23 LAB — LIPID PANEL
Chol/HDL Ratio: 4.3 ratio (ref 0.0–4.4)
Cholesterol, Total: 202 mg/dL — ABNORMAL HIGH (ref 100–199)
HDL: 47 mg/dL (ref >39)
LDL Chol Calc (NIH): 119 mg/dL — ABNORMAL HIGH (ref 0–99)
Triglycerides: 203 mg/dL — ABNORMAL HIGH (ref 0–149)
VLDL Cholesterol Cal: 36 mg/dL (ref 5–40)

## 2021-11-23 LAB — TSH: TSH: 0.747 u[IU]/mL (ref 0.450–4.500)

## 2021-11-24 NOTE — Assessment & Plan Note (Signed)
Recommend continue to work on eating healthy diet and exercise.  

## 2021-11-24 NOTE — Progress Notes (Signed)
Blood count normal.  Liver function normal.  Kidney function normal.  Thyroid function normal.  Cholesterol: LDL too high still. Trigs too high. Recommend change pravastatin to rosuvastatin 20 mg daily. Continue vascepa 1 gm 2 capsules twice daily.  HBA1C: 5.9. prediabetes good.  Iron levels improved, but would continue iron supplement.

## 2021-11-24 NOTE — Assessment & Plan Note (Addendum)
Not at goal. Change pravastatin 20 mg to rosuvastatin 20 mg daily.  Continue vascepa.  Continue to work on eating a healthy diet and exercise.  Labs drawn today.

## 2021-11-24 NOTE — Assessment & Plan Note (Signed)
The current medical regimen is effective;  continue present plan and medications.  

## 2021-11-24 NOTE — Assessment & Plan Note (Signed)
Well controlled.  ?No changes to medicines.  ?Continue to work on eating a healthy diet and exercise.  ?Labs drawn today.  ?

## 2021-11-25 ENCOUNTER — Encounter: Payer: Self-pay | Admitting: Family Medicine

## 2021-11-25 ENCOUNTER — Other Ambulatory Visit: Payer: Self-pay | Admitting: Family Medicine

## 2021-11-27 ENCOUNTER — Other Ambulatory Visit: Payer: Self-pay

## 2021-11-27 MED ORDER — ROSUVASTATIN CALCIUM 20 MG PO TABS: 20.0000 mg | ORAL_TABLET | Freq: Every day | ORAL | 3 refills | Status: DC

## 2021-12-06 NOTE — Assessment & Plan Note (Signed)
Management per specialist.  The current medical regimen is effective;  continue present plan and medications. Pt is much better than at past appointments.

## 2021-12-06 NOTE — Assessment & Plan Note (Signed)
Recheck cbc.  Continue ferrous gluconate.  Keep follow up with Dr. Charm Barges.

## 2022-01-07 DIAGNOSIS — R195 Other fecal abnormalities: Secondary | ICD-10-CM | POA: Diagnosis not present

## 2022-01-07 DIAGNOSIS — D500 Iron deficiency anemia secondary to blood loss (chronic): Secondary | ICD-10-CM | POA: Diagnosis not present

## 2022-01-21 DIAGNOSIS — Z0100 Encounter for examination of eyes and vision without abnormal findings: Secondary | ICD-10-CM | POA: Diagnosis not present

## 2022-01-21 DIAGNOSIS — H524 Presbyopia: Secondary | ICD-10-CM | POA: Diagnosis not present

## 2022-01-22 DIAGNOSIS — D500 Iron deficiency anemia secondary to blood loss (chronic): Secondary | ICD-10-CM | POA: Diagnosis not present

## 2022-01-22 DIAGNOSIS — K5730 Diverticulosis of large intestine without perforation or abscess without bleeding: Secondary | ICD-10-CM | POA: Diagnosis not present

## 2022-01-22 LAB — HM COLONOSCOPY

## 2022-01-24 ENCOUNTER — Other Ambulatory Visit: Payer: Self-pay | Admitting: Family Medicine

## 2022-01-24 MED ORDER — ALBUTEROL SULFATE HFA 108 (90 BASE) MCG/ACT IN AERS: 1.0000 | INHALATION_SPRAY | RESPIRATORY_TRACT | 3 refills | Status: DC | PRN

## 2022-01-30 ENCOUNTER — Encounter: Payer: Self-pay | Admitting: Legal Medicine

## 2022-01-30 ENCOUNTER — Ambulatory Visit (INDEPENDENT_AMBULATORY_CARE_PROVIDER_SITE_OTHER): Payer: Medicare HMO | Admitting: Legal Medicine

## 2022-01-30 VITALS — BP 130/70 | HR 94 | Temp 98.2°F | Resp 15 | Ht 63.0 in | Wt 255.0 lb

## 2022-01-30 DIAGNOSIS — J45901 Unspecified asthma with (acute) exacerbation: Secondary | ICD-10-CM

## 2022-01-30 MED ORDER — AZITHROMYCIN 250 MG PO TABS
ORAL_TABLET | ORAL | 0 refills | Status: AC
Start: 2022-01-30 — End: 2022-02-04

## 2022-01-30 MED ORDER — PREDNISONE 10 MG (21) PO TBPK: ORAL_TABLET | ORAL | 0 refills | Status: DC

## 2022-01-30 NOTE — Progress Notes (Signed)
Chest x-ray  Established Patient Office Visit  Subjective:  Patient ID: Cynthia [name], female    DOB: 10/29/1961  Age: 61 y.o. MRN: [id number]  CC:  Chief Complaint  Patient presents with   Cough   Shortness of Breath    HPI Cynthia [name] presents for cough, SOB since 2 days ago after she was cleaning the house and she was exposed to cat's hair. She mentioned that albuterol inhaler is not helping. She has a long history of asthma and no SOB and wheezing.  Using albutrol.  Past Medical History:  Diagnosis Date   Bipolar 1 disorder ([place])    Essential hypertension    GERD (gastroesophageal reflux disease)     History reviewed. No pertinent surgical history.  Family History  Problem Relation Age of Onset   Cancer Mother        lung   Cancer Father        prostate   Diabetes Paternal Grandfather     Social History   Socioeconomic History   Marital status: Divorced    Spouse name: Not on file   Number of children: 3   Years of education: Not on file   Highest education level: Not on file  Occupational History   Not on file  Tobacco Use   Smoking status: Never   Smokeless tobacco: Never  Substance and Sexual Activity   Alcohol use: Not Currently    Comment: rarely   Drug use: Never   Sexual activity: Not on file  Other Topics Concern   Not on file  Social History Narrative   Not on file   Social Determinants of Health   Financial Resource Strain: Low Risk    Difficulty of Paying Living Expenses: Not hard at all  Food Insecurity: No Food Insecurity   Worried About Charity fundraiser in the Last Year: Never true   [place] in the Last Year: Never true  Transportation Needs: No Transportation Needs   Lack of Transportation (Medical): No   Lack of Transportation (Non-Medical): No  Physical Activity: Inactive   Days of Exercise per Week: 0 days   Minutes of Exercise per Session: 0 min  Stress: No Stress Concern Present   Feeling of Stress :  Not at all  Social Connections: Socially Isolated   Frequency of Communication with Friends and Family: More than three times a week   Frequency of Social Gatherings with Friends and Family: More than three times a week   Attends Religious Services: Never   Marine scientist or Organizations: No   Attends Music therapist: Never   Marital Status: Divorced  Human resources officer Violence: Not At Risk   Fear of Current or Ex-Partner: No   Emotionally Abused: No   Physically Abused: No   Sexually Abused: No    Outpatient Medications Prior to Visit  Medication Sig Dispense Refill   albuterol (VENTOLIN HFA) 108 (90 Base) MCG/ACT inhaler Inhale 1-2 puffs into the lungs every 4 (four) hours as needed for wheezing or shortness of breath. 1 each 3   buPROPion (WELLBUTRIN XL) 150 MG 24 hr tablet Take 1 tablet (150 mg total) by mouth daily. For depression 30 tablet 0   divalproex (DEPAKOTE) 500 MG DR tablet Take 1 tablet (500 mg total) by mouth every 12 (twelve) hours. For mood stabilization 60 tablet 0   ferrous gluconate (FERGON) 324 MG tablet Take 324 mg by mouth daily.  furosemide (LASIX) 40 MG tablet Take 1 tablet by mouth twice daily 60 tablet 0   GAVILYTE-G 236 g solution SMARTSIG:Milliliter(s) By Mouth     hydrOXYzine (ATARAX/VISTARIL) 10 MG tablet Take 2 tablets (20 mg total) by mouth 3 (three) times daily as needed for anxiety. 60 tablet 0   ibuprofen (ADVIL) 600 MG tablet Take 1 tablet (600 mg total) by mouth every 6 (six) hours as needed for mild pain or moderate pain. 30 tablet 0   icosapent Ethyl (VASCEPA) 1 g capsule Take 2 capsules (2 g total) by mouth 2 (two) times daily. 360 capsule 0   melatonin 3 MG TABS tablet Take 1 tablet (3 mg total) by mouth at bedtime. For sleep 30 tablet 0   rosuvastatin (CRESTOR) 20 MG tablet Take 1 tablet (20 mg total) by mouth daily. 90 tablet 3   tiZANidine (ZANAFLEX) 4 MG tablet Take 1 tablet (4 mg total) by mouth every 6 (six) hours  as needed for muscle spasms. 50 tablet 0   No facility-administered medications prior to visit.    No Known Allergies  ROS Review of Systems  Constitutional:  Negative for chills, fatigue and fever.  HENT:  Negative for congestion, ear pain and sore throat.   Respiratory:  Positive for cough and shortness of breath.   Cardiovascular:  Negative for chest pain and palpitations.  Gastrointestinal:  Negative for abdominal pain, constipation, diarrhea, nausea and vomiting.  Endocrine: Negative for polydipsia, polyphagia and polyuria.  Genitourinary:  Negative for difficulty urinating and dysuria.  Musculoskeletal:  Negative for arthralgias, back pain and myalgias.  Skin:  Negative for rash.  Neurological:  Negative for headaches.  Psychiatric/Behavioral:  Negative for dysphoric mood. The patient is not nervous/anxious.      Objective:    Physical Exam Vitals reviewed.  Constitutional:      General: She is not in acute distress.    Appearance: She is obese.  HENT:     Head: Normocephalic.     Right Ear: Tympanic membrane normal.     Left Ear: Tympanic membrane normal.     Nose: Nose normal.     Mouth/Throat:     Mouth: Mucous membranes are moist.  Eyes:     Extraocular Movements: Extraocular movements intact.     Conjunctiva/sclera: Conjunctivae normal.     Pupils: Pupils are equal, round, and reactive to light.  Cardiovascular:     Rate and Rhythm: Normal rate and regular rhythm.     Pulses: Normal pulses.     Heart sounds: Normal heart sounds. No murmur heard.   No gallop.  Pulmonary:     Breath sounds: Wheezing and rhonchi present.  Abdominal:     General: Abdomen is flat. Bowel sounds are normal. There is no distension.     Tenderness: There is no abdominal tenderness.  Musculoskeletal:     Cervical back: Normal range of motion and neck supple.  Skin:    Capillary Refill: Capillary refill takes less than 2 seconds.  Neurological:     General: No focal deficit  present.     Mental Status: She is alert. Mental status is at baseline.    BP 130/70    Pulse 94    Temp 98.2 F (36.8 C)    Resp 15    Ht _0  (1.6 m)    Wt 255 lb (115.7 kg)    SpO2 97%    BMI 45.17 kg/m  Wt Readings from Last 3 Encounters:  01/30/22  255 lb (115.7 kg)  11/22/21 256 lb (116.1 kg)  08/18/21 256 lb (116.1 kg)     Health Maintenance Due  Topic Date Due   HIV Screening  Never done   Hepatitis C Screening  Never done   TETANUS/TDAP  Never done   PAP SMEAR-Modifier  Never done   Zoster Vaccines- Shingrix (1 of 2) Never done    There are no preventive care reminders to display for this patient.  Lab Results  Component Value Date   TSH 0.747 11/22/2021   Lab Results  Component Value Date   WBC 5.8 11/22/2021   HGB 11.3 11/22/2021   HCT 35.0 11/22/2021   MCV 84 11/22/2021   PLT 356 11/22/2021   Lab Results  Component Value Date   NA 142 11/22/2021   K 4.6 11/22/2021   CO2 28 11/22/2021   GLUCOSE 106 (H) 11/22/2021   BUN 10 11/22/2021   CREATININE 0.84 11/22/2021   BILITOT 0.5 11/22/2021   ALKPHOS 70 11/22/2021   AST 14 11/22/2021   ALT 8 11/22/2021   PROT 6.7 11/22/2021   ALBUMIN 3.8 11/22/2021   CALCIUM 9.3 11/22/2021   ANIONGAP 12 08/28/2020   EGFR 80 11/22/2021   Lab Results  Component Value Date   CHOL 202 (H) 11/22/2021   Lab Results  Component Value Date   HDL 47 11/22/2021   Lab Results  Component Value Date   LDLCALC 119 (H) 11/22/2021   Lab Results  Component Value Date   TRIG 203 (H) 11/22/2021   Lab Results  Component Value Date   CHOLHDL 4.3 11/22/2021   Lab Results  Component Value Date   HGBA1C 5.9 (H) 11/22/2021      Assessment & Plan:   Problem List Items Addressed This Visit   None Visit Diagnoses     Moderate asthma with acute exacerbation, unspecified whether persistent    -  Primary   Relevant Medications   azithromycin (ZITHROMAX) 250 MG tablet   predniSONE (STERAPRED UNI-PAK 21 TAB) 10 MG (21)  TBPK tablet   Other Relevant Orders   DG Chest 2 View Patient comes in with asthma with exacerbation she was given an albuterol nebulizer which only helped a small amount and then given a DuoNeb nebulizer which helped much more.  She has much less wheezing and better she breathing.  We will start her on prednisone and azithromycin and actually send her for chest x-ray we will follow-up in 1 to 2 weeks with her provider.       Meds ordered this encounter  Medications   azithromycin (ZITHROMAX) 250 MG tablet    Sig: Take 2 tablets on day 1, then 1 tablet daily on days 2 through 5    Dispense:  6 tablet    Refill:  0   predniSONE (STERAPRED UNI-PAK 21 TAB) 10 MG (21) TBPK tablet    Sig: Take 6ills first day , then 5 pills day 2 and then cut down one pill day until gone    Dispense:  21 tablet    Refill:  0    Follow-up: Return in about 1 week (around 02/06/2022) for asthma.    Reinaldo Meeker, MD

## 2022-02-02 ENCOUNTER — Other Ambulatory Visit: Payer: Self-pay | Admitting: Family Medicine

## 2022-02-03 NOTE — Telephone Encounter (Signed)
Refill sent to pharmacy.   

## 2022-02-05 ENCOUNTER — Ambulatory Visit: Payer: Medicare HMO | Admitting: Family Medicine

## 2022-02-06 ENCOUNTER — Encounter: Payer: Self-pay | Admitting: Legal Medicine

## 2022-02-06 ENCOUNTER — Other Ambulatory Visit: Payer: Self-pay

## 2022-02-06 ENCOUNTER — Ambulatory Visit (INDEPENDENT_AMBULATORY_CARE_PROVIDER_SITE_OTHER): Payer: Medicare HMO | Admitting: Legal Medicine

## 2022-02-06 VITALS — BP 138/88 | HR 92 | Temp 98.2°F | Ht 63.0 in | Wt 260.4 lb

## 2022-02-06 DIAGNOSIS — J4521 Mild intermittent asthma with (acute) exacerbation: Secondary | ICD-10-CM | POA: Diagnosis not present

## 2022-02-06 MED ORDER — TRIAMCINOLONE ACETONIDE 40 MG/ML IJ SUSP
80.0000 mg | Freq: Once | INTRAMUSCULAR | Status: AC
  Administered 2022-02-06: 80 mg via INTRAMUSCULAR

## 2022-02-06 NOTE — Progress Notes (Signed)
? ?Subjective:  ?Patient ID: Cynthia [name], female    DOB: Apr 27, 1961  Age: 61 y.o. MRN: [id number] ? ?Chief Complaint  ?Patient presents with  ? Asthma  ?  1 week follow up  ? ? ?Asthma ?She complains of wheezing. There is no cough or shortness of breath. Pertinent negatives include no appetite change, chest pain, ear pain, fever, headaches, myalgias, rhinorrhea, sneezing or sore throat. Her past medical history is significant for asthma.  ?  ? ?Cynthia [name], a 61 year old female presents today for a follow up on asthma. Patient states she is doing better. Patient states her inhaler does not seem to be helping. ? ?Current Outpatient Medications on File Prior to Visit  ?Medication Sig Dispense Refill  ? albuterol (VENTOLIN HFA) 108 (90 Base) MCG/ACT inhaler Inhale 1-2 puffs into the lungs every 4 (four) hours as needed for wheezing or shortness of breath. 1 each 3  ? buPROPion (WELLBUTRIN XL) 150 MG 24 hr tablet Take 1 tablet (150 mg total) by mouth daily. For depression 30 tablet 0  ? divalproex (DEPAKOTE) 500 MG DR tablet Take 1 tablet (500 mg total) by mouth every 12 (twelve) hours. For mood stabilization 60 tablet 0  ? ferrous gluconate (FERGON) 324 MG tablet Take 324 mg by mouth daily.    ? furosemide (LASIX) 40 MG tablet Take 1 tablet by mouth twice daily 60 tablet 0  ? GAVILYTE-G 236 g solution SMARTSIG:Milliliter(s) By Mouth    ? hydrOXYzine (ATARAX/VISTARIL) 10 MG tablet Take 2 tablets (20 mg total) by mouth 3 (three) times daily as needed for anxiety. 60 tablet 0  ? ibuprofen (ADVIL) 600 MG tablet Take 1 tablet (600 mg total) by mouth every 6 (six) hours as needed for mild pain or moderate pain. 30 tablet 0  ? melatonin 3 MG TABS tablet Take 1 tablet (3 mg total) by mouth at bedtime. For sleep 30 tablet 0  ? predniSONE (STERAPRED UNI-PAK 21 TAB) 10 MG (21) TBPK tablet Take 6ills first day , then 5 pills day 2 and then cut down one pill day until gone 21 tablet 0  ? rosuvastatin (CRESTOR) 20 MG  tablet Take 1 tablet (20 mg total) by mouth daily. 90 tablet 3  ? tiZANidine (ZANAFLEX) 4 MG tablet Take 1 tablet (4 mg total) by mouth every 6 (six) hours as needed for muscle spasms. 50 tablet 0  ? VASCEPA 1 g capsule Take 2 capsules by mouth twice daily 360 capsule 0  ? ?No current facility-administered medications on file prior to visit.  ? ?Past Medical History:  ?Diagnosis Date  ? Bipolar 1 disorder ([facility])   ? Essential hypertension   ? GERD (gastroesophageal reflux disease)   ? ?History reviewed. No pertinent surgical history.  ?Family History  ?Problem Relation Age of Onset  ? Cancer Mother   ?     lung  ? Cancer Father   ?     prostate  ? Diabetes Paternal Grandfather   ? ?Social History  ? ?Socioeconomic History  ? Marital status: Divorced  ?  Spouse name: Not on file  ? Number of children: 3  ? Years of education: Not on file  ? Highest education level: Not on file  ?Occupational History  ? Not on file  ?Tobacco Use  ? Smoking status: Never  ? Smokeless tobacco: Never  ?Substance and Sexual Activity  ? Alcohol use: Not Currently  ?  Comment: rarely  ? Drug use: Never  ?  Sexual activity: Not on file  ?Other Topics Concern  ? Not on file  ?Social History Narrative  ? Not on file  ? ?Social Determinants of Health  ? ?Financial Resource Strain: Low Risk   ? Difficulty of Paying Living Expenses: Not hard at all  ?Food Insecurity: No Food Insecurity  ? Worried About Programme researcher, broadcasting/film/video in the Last Year: Never true  ? Ran Out of Food in the Last Year: Never true  ?Transportation Needs: No Transportation Needs  ? Lack of Transportation (Medical): No  ? Lack of Transportation (Non-Medical): No  ?Physical Activity: Inactive  ? Days of Exercise per Week: 0 days  ? Minutes of Exercise per Session: 0 min  ?Stress: No Stress Concern Present  ? Feeling of Stress : Not at all  ?Social Connections: Socially Isolated  ? Frequency of Communication with Friends and Family: More than three times a week  ? Frequency of Social  Gatherings with Friends and Family: More than three times a week  ? Attends Religious Services: Never  ? Active Member of Clubs or Organizations: No  ? Attends Banker Meetings: Never  ? Marital Status: Divorced  ? ? ?Review of Systems  ?Constitutional:  Negative for appetite change, chills, fatigue and fever.  ?HENT:  Negative for congestion, ear discharge, ear pain, rhinorrhea, sinus pressure, sneezing and sore throat.   ?Eyes:  Negative for visual disturbance.  ?Respiratory:  Positive for wheezing. Negative for cough, chest tightness and shortness of breath.   ?Cardiovascular:  Negative for chest pain, palpitations and leg swelling.  ?Gastrointestinal:  Negative for abdominal pain, diarrhea, nausea and vomiting.  ?Endocrine: Negative for polydipsia, polyphagia and polyuria.  ?Genitourinary:  Negative for difficulty urinating, dysuria, frequency, hematuria, menstrual problem, urgency, vaginal bleeding, vaginal discharge and vaginal pain.  ?Musculoskeletal:  Negative for back pain, gait problem, joint swelling, myalgias and neck pain.  ?Neurological:  Negative for dizziness, seizures, syncope, weakness, numbness and headaches.  ?Psychiatric/Behavioral:  Negative for agitation, confusion, hallucinations, sleep disturbance and suicidal ideas. The patient is not nervous/anxious.   ? ? ?Objective:  ?BP 138/88   Pulse 92   Temp 98.2 ?F (36.8 ?C)   Ht 5\' 3"  (1.6 m)   Wt 260 lb 6.4 oz (118.1 kg)   SpO2 98%   BMI 46.13 kg/m?  ? ?BP/Weight 02/06/2022 01/30/2022 11/22/2021  ?Systolic BP 138 130 124  ?Diastolic BP 88 70 78  ?Wt. (Lbs) 260.4 255 256  ?BMI 46.13 45.17 45.35  ?Some encounter information is confidential and restricted. Go to Review Flowsheets activity to see all data.  ? ? ?Physical Exam ?Vitals reviewed.  ?Constitutional:   ?   Appearance: Normal appearance. She is obese. She is ill-appearing.  ?HENT:  ?   Right Ear: Tympanic membrane normal.  ?   Left Ear: Tympanic membrane normal.  ?    Mouth/Throat:  ?   Mouth: Mucous membranes are moist.  ?Eyes:  ?   Extraocular Movements: Extraocular movements intact.  ?   Conjunctiva/sclera: Conjunctivae normal.  ?   Pupils: Pupils are equal, round, and reactive to light.  ?Cardiovascular:  ?   Rate and Rhythm: Normal rate and regular rhythm.  ?   Pulses: Normal pulses.  ?   Heart sounds: Normal heart sounds. No murmur heard. ?  No gallop.  ?Pulmonary:  ?   Breath sounds: Wheezing and rhonchi present.  ?Neurological:  ?   Mental Status: She is alert.  ? ? ? ?  ? ?  Lab Results  ?Component Value Date  ? WBC 5.8 11/22/2021  ? HGB 11.3 11/22/2021  ? HCT 35.0 11/22/2021  ? PLT 356 11/22/2021  ? GLUCOSE 106 (H) 11/22/2021  ? CHOL 202 (H) 11/22/2021  ? TRIG 203 (H) 11/22/2021  ? HDL 47 11/22/2021  ? LDLCALC 119 (H) 11/22/2021  ? ALT 8 11/22/2021  ? AST 14 11/22/2021  ? NA 142 11/22/2021  ? K 4.6 11/22/2021  ? CL 102 11/22/2021  ? CREATININE 0.84 11/22/2021  ? BUN 10 11/22/2021  ? CO2 28 11/22/2021  ? TSH 0.747 11/22/2021  ? INR 1.0 07/03/2019  ? HGBA1C 5.9 (H) 11/22/2021  ? ? ? ? ?Assessment & Plan:  ? ?Diagnoses and all orders for this visit: ?Mild intermittent asthma with acute exacerbation ?-     DG Chest 2 View; Future ?-     triamcinolone acetonide (KENALOG-40) injection 80 mg ?Patient is having continued asthma and did not get chest x-ray.  Gave nebulizer treatment with albuterol that partial cleared her wheezing.  Get x-ray, Gave kenalog shot ?  ? ? ? ?  ? ?Follow-up: Return in about 2 weeks (around 02/20/2022) for asthma. ? ?An After Visit Summary was printed and given to the patient. ? ?Brent Bulla, MD ?[facility] ?[phone] ?

## 2022-02-10 ENCOUNTER — Other Ambulatory Visit: Payer: Self-pay

## 2022-02-10 DIAGNOSIS — J4521 Mild intermittent asthma with (acute) exacerbation: Secondary | ICD-10-CM

## 2022-02-11 DIAGNOSIS — R69 Illness, unspecified: Secondary | ICD-10-CM | POA: Diagnosis not present

## 2022-02-11 DIAGNOSIS — F3132 Bipolar disorder, current episode depressed, moderate: Secondary | ICD-10-CM | POA: Diagnosis not present

## 2022-02-20 ENCOUNTER — Ambulatory Visit (INDEPENDENT_AMBULATORY_CARE_PROVIDER_SITE_OTHER): Payer: Medicare HMO | Admitting: Legal Medicine

## 2022-02-20 ENCOUNTER — Other Ambulatory Visit: Payer: Self-pay

## 2022-02-20 ENCOUNTER — Encounter: Payer: Self-pay | Admitting: Legal Medicine

## 2022-02-20 VITALS — BP 122/80 | HR 99 | Temp 97.7°F | Ht 63.0 in | Wt 254.0 lb

## 2022-02-20 DIAGNOSIS — J4540 Moderate persistent asthma, uncomplicated: Secondary | ICD-10-CM

## 2022-02-20 MED ORDER — BREZTRI AEROSPHERE 160-9-4.8 MCG/ACT IN AERO: 2.0000 | INHALATION_SPRAY | Freq: Two times a day (BID) | RESPIRATORY_TRACT | 3 refills | Status: DC | PRN

## 2022-02-20 NOTE — Progress Notes (Signed)
? ?Subjective:  ?Patient ID: Cynthia [name], female    DOB: [birth year]  Age: 61 y.o. MRN: [id number] ? ?Chief Complaint  ?Patient presents with  ? Asthma  ? ? ?Asthma ?There is no cough, shortness of breath or wheezing. Pertinent negatives include no appetite change, chest pain, ear pain, fever, headaches, myalgias, rhinorrhea, sneezing or sore throat. Her past medical history is significant for asthma.  ?  ?Patient is here for follow on new medication for asthma. Patient was seen on 02/06/2022 for acute exacerbation for asthma. Patient feels better. Patient using Breztri PRN that we g e as sample ? ?Current Outpatient Medications on File Prior to Visit  ?Medication Sig Dispense Refill  ? albuterol (VENTOLIN HFA) 108 (90 Base) MCG/ACT inhaler Inhale 1-2 puffs into the lungs every 4 (four) hours as needed for wheezing or shortness of breath. 1 each 3  ? buPROPion (WELLBUTRIN XL) 150 MG 24 hr tablet Take 1 tablet (150 mg total) by mouth daily. For depression 30 tablet 0  ? divalproex (DEPAKOTE) 500 MG DR tablet Take 1 tablet (500 mg total) by mouth every 12 (twelve) hours. For mood stabilization 60 tablet 0  ? ferrous gluconate (FERGON) 324 MG tablet Take 324 mg by mouth daily.    ? furosemide (LASIX) 40 MG tablet Take 1 tablet by mouth twice daily 60 tablet 0  ? GAVILYTE-G 236 g solution SMARTSIG:Milliliter(s) By Mouth    ? hydrOXYzine (ATARAX/VISTARIL) 10 MG tablet Take 2 tablets (20 mg total) by mouth 3 (three) times daily as needed for anxiety. 60 tablet 0  ? ibuprofen (ADVIL) 600 MG tablet Take 1 tablet (600 mg total) by mouth every 6 (six) hours as needed for mild pain or moderate pain. 30 tablet 0  ? melatonin 3 MG TABS tablet Take 1 tablet (3 mg total) by mouth at bedtime. For sleep 30 tablet 0  ? rosuvastatin (CRESTOR) 20 MG tablet Take 1 tablet (20 mg total) by mouth daily. 90 tablet 3  ? tiZANidine (ZANAFLEX) 4 MG tablet Take 1 tablet (4 mg total) by mouth every 6 (six) hours as needed for muscle spasms.  50 tablet 0  ? ?No current facility-administered medications on file prior to visit.  ? ?Past Medical History:  ?Diagnosis Date  ? Bipolar 1 disorder ([facility])   ? Essential hypertension   ? GERD (gastroesophageal reflux disease)   ? ?History reviewed. No pertinent surgical history.  ?Family History  ?Problem Relation Age of Onset  ? Cancer Mother   ?     lung  ? Cancer Father   ?     prostate  ? Diabetes Paternal Grandfather   ? ?Social History  ? ?Socioeconomic History  ? Marital status: Divorced  ?  Spouse name: Not on file  ? Number of children: 3  ? Years of education: Not on file  ? Highest education level: Not on file  ?Occupational History  ? Not on file  ?Tobacco Use  ? Smoking status: Never  ? Smokeless tobacco: Never  ?Substance and Sexual Activity  ? Alcohol use: Not Currently  ?  Comment: rarely  ? Drug use: Never  ? Sexual activity: Not on file  ?Other Topics Concern  ? Not on file  ?Social History Narrative  ? Not on file  ? ?Social Determinants of Health  ? ?Financial Resource Strain: Low Risk   ? Difficulty of Paying Living Expenses: Not hard at all  ?Food Insecurity: No Food Insecurity  ? Worried About Running  Out of Food in the Last Year: Never true  ? Ran Out of Food in the Last Year: Never true  ?Transportation Needs: No Transportation Needs  ? Lack of Transportation (Medical): No  ? Lack of Transportation (Non-Medical): No  ?Physical Activity: Inactive  ? Days of Exercise per Week: 0 days  ? Minutes of Exercise per Session: 0 min  ?Stress: No Stress Concern Present  ? Feeling of Stress : Not at all  ?Social Connections: Socially Isolated  ? Frequency of Communication with Friends and Family: More than three times a week  ? Frequency of Social Gatherings with Friends and Family: More than three times a week  ? Attends Religious Services: Never  ? Active Member of Clubs or Organizations: No  ? Attends BankerClub or Organization Meetings: Never  ? Marital Status: Divorced  ? ? ?Review of Systems   ?Constitutional:  Negative for appetite change, chills, fatigue and fever.  ?HENT:  Negative for congestion, ear discharge, ear pain, rhinorrhea, sinus pressure, sneezing and sore throat.   ?Eyes:  Negative for visual disturbance.  ?Respiratory:  Negative for cough, chest tightness, shortness of breath and wheezing.   ?Cardiovascular:  Negative for chest pain, palpitations and leg swelling.  ?Gastrointestinal:  Negative for abdominal pain, diarrhea, nausea and vomiting.  ?Endocrine: Negative for polydipsia, polyphagia and polyuria.  ?Genitourinary:  Negative for difficulty urinating, dysuria, frequency, hematuria, menstrual problem, urgency, vaginal bleeding, vaginal discharge and vaginal pain.  ?Musculoskeletal:  Negative for back pain, gait problem, joint swelling, myalgias and neck pain.  ?Neurological:  Negative for dizziness, seizures, syncope, weakness, numbness and headaches.  ?Psychiatric/Behavioral:  Negative for agitation, confusion, hallucinations, sleep disturbance and suicidal ideas. The patient is not nervous/anxious.   ? ? ?Objective:  ?BP 122/80   Pulse 99   Temp 97.7 ?F (36.5 ?C)   Ht 5\' 3"  (1.6 m)   Wt 254 lb (115.2 kg)   SpO2 96%   BMI 44.99 kg/m?  ? ?BP/Weight 02/20/2022 02/06/2022 01/30/2022  ?Systolic BP 122 138 130  ?Diastolic BP 80 88 70  ?Wt. (Lbs) 254 260.4 255  ?BMI 44.99 46.13 45.17  ?Some encounter information is confidential and restricted. Go to Review Flowsheets activity to see all data.  ? ? ?Physical Exam ?Vitals reviewed.  ?Constitutional:   ?   General: She is not in acute distress. ?   Appearance: Normal appearance.  ?HENT:  ?   Head: Normocephalic.  ?   Right Ear: Tympanic membrane normal.  ?   Left Ear: Tympanic membrane normal.  ?   Nose: Nose normal.  ?Eyes:  ?   Extraocular Movements: Extraocular movements intact.  ?   Conjunctiva/sclera: Conjunctivae normal.  ?   Pupils: Pupils are equal, round, and reactive to light.  ?Cardiovascular:  ?   Rate and Rhythm: Normal rate  and regular rhythm.  ?   Pulses: Normal pulses.  ?   Heart sounds: Normal heart sounds. No murmur heard. ?  No gallop.  ?Pulmonary:  ?   Effort: Pulmonary effort is normal. No respiratory distress.  ?   Breath sounds: Normal breath sounds. No wheezing.  ?Abdominal:  ?   General: Abdomen is flat. Bowel sounds are normal. There is no distension.  ?   Palpations: Abdomen is soft.  ?   Tenderness: There is no abdominal tenderness.  ?Musculoskeletal:  ?   Cervical back: Normal range of motion.  ?Skin: ?   Capillary Refill: Capillary refill takes less than 2 seconds.  ?Neurological:  ?  General: No focal deficit present.  ?   Mental Status: She is alert and oriented to person, place, and time.  ? ? ?Diabetic Foot Exam - Simple   ?No data filed ?  ?  ? ?Lab Results  ?Component Value Date  ? WBC 5.8 11/22/2021  ? HGB 11.3 11/22/2021  ? HCT 35.0 11/22/2021  ? PLT 356 11/22/2021  ? GLUCOSE 106 (H) 11/22/2021  ? CHOL 202 (H) 11/22/2021  ? TRIG 203 (H) 11/22/2021  ? HDL 47 11/22/2021  ? LDLCALC 119 (H) 11/22/2021  ? ALT 8 11/22/2021  ? AST 14 11/22/2021  ? NA 142 11/22/2021  ? K 4.6 11/22/2021  ? CL 102 11/22/2021  ? CREATININE 0.84 11/22/2021  ? BUN 10 11/22/2021  ? CO2 28 11/22/2021  ? TSH 0.747 11/22/2021  ? INR 1.0 07/03/2019  ? HGBA1C 5.9 (H) 11/22/2021  ? ? ? ? ?Assessment & Plan:  ? ?Problem List Items Addressed This Visit   ?None ?Visit Diagnoses   ? ? Moderate persistent asthma without complication    -  Primary  ? Relevant Medications  ? Budeson-Glycopyrrol-Formoterol (BREZTRI AEROSPHERE) 160-9-4.8 MCG/ACT AERO ?Asthma is stable, continue chronic treatment  ? ?  ?. ? ?Meds ordered this encounter  ?Medications  ? Budeson-Glycopyrrol-Formoterol (BREZTRI AEROSPHERE) 160-9-4.8 MCG/ACT AERO  ?  Sig: Inhale 2 puffs into the lungs 2 (two) times daily as needed.  ?  Dispense:  10.7 g  ?  Refill:  3  ? ? ?  ? ?Follow-up: Return if symptoms worsen or fail to improve. ? ?An After Visit Summary was printed and given to the  patient. ? ?Brent Bulla, MD ?[facility] ?[phone]

## 2022-04-28 DIAGNOSIS — R410 Disorientation, unspecified: Secondary | ICD-10-CM | POA: Diagnosis not present

## 2022-04-28 DIAGNOSIS — H538 Other visual disturbances: Secondary | ICD-10-CM | POA: Diagnosis not present

## 2022-04-28 DIAGNOSIS — Z743 Need for continuous supervision: Secondary | ICD-10-CM | POA: Diagnosis not present

## 2022-04-28 DIAGNOSIS — R4781 Slurred speech: Secondary | ICD-10-CM | POA: Diagnosis not present

## 2022-04-28 DIAGNOSIS — I10 Essential (primary) hypertension: Secondary | ICD-10-CM | POA: Diagnosis not present

## 2022-04-29 ENCOUNTER — Emergency Department (HOSPITAL_COMMUNITY): Payer: Medicare HMO

## 2022-04-29 ENCOUNTER — Emergency Department (HOSPITAL_COMMUNITY)
Admission: EM | Admit: 2022-04-29 | Discharge: 2022-04-29 | Disposition: A | Discharge status: Home / Self Care | Payer: Medicare HMO | Attending: Emergency Medicine | Admitting: Emergency Medicine

## 2022-04-29 ENCOUNTER — Other Ambulatory Visit: Payer: Self-pay

## 2022-04-29 ENCOUNTER — Encounter (HOSPITAL_COMMUNITY): Payer: Self-pay | Admitting: Emergency Medicine

## 2022-04-29 DIAGNOSIS — R4182 Altered mental status, unspecified: Secondary | ICD-10-CM | POA: Insufficient documentation

## 2022-04-29 DIAGNOSIS — I10 Essential (primary) hypertension: Secondary | ICD-10-CM | POA: Diagnosis not present

## 2022-04-29 DIAGNOSIS — R410 Disorientation, unspecified: Secondary | ICD-10-CM | POA: Diagnosis not present

## 2022-04-29 DIAGNOSIS — R531 Weakness: Secondary | ICD-10-CM | POA: Diagnosis not present

## 2022-04-29 DIAGNOSIS — K449 Diaphragmatic hernia without obstruction or gangrene: Secondary | ICD-10-CM | POA: Diagnosis not present

## 2022-04-29 LAB — URINALYSIS, ROUTINE W REFLEX MICROSCOPIC
Bilirubin Urine: NEGATIVE
Glucose, UA: NEGATIVE mg/dL
Hgb urine dipstick: NEGATIVE
Ketones, ur: 5 mg/dL — AB
Leukocytes,Ua: NEGATIVE
Nitrite: NEGATIVE
Protein, ur: NEGATIVE mg/dL
Specific Gravity, Urine: 1.014 (ref 1.005–1.030)
pH: 6 (ref 5.0–8.0)

## 2022-04-29 LAB — CBG MONITORING, ED: Glucose-Capillary: 139 mg/dL — ABNORMAL HIGH (ref 70–99)

## 2022-04-29 LAB — CBC WITH DIFFERENTIAL/PLATELET
Abs Immature Granulocytes: 0.03 10*3/uL (ref 0.00–0.07)
Basophils Absolute: 0.1 10*3/uL (ref 0.0–0.1)
Basophils Relative: 1 %
Eosinophils Absolute: 0.1 10*3/uL (ref 0.0–0.5)
Eosinophils Relative: 2 %
HCT: 41.4 % (ref 36.0–46.0)
Hemoglobin: 12.8 g/dL (ref 12.0–15.0)
Immature Granulocytes: 0 %
Lymphocytes Relative: 26 %
Lymphs Abs: 2.2 10*3/uL (ref 0.7–4.0)
MCH: 28.7 pg (ref 26.0–34.0)
MCHC: 30.9 g/dL (ref 30.0–36.0)
MCV: 92.8 fL (ref 80.0–100.0)
Monocytes Absolute: 0.6 10*3/uL (ref 0.1–1.0)
Monocytes Relative: 7 %
Neutro Abs: 5.6 10*3/uL (ref 1.7–7.7)
Neutrophils Relative %: 64 %
Platelets: 308 10*3/uL (ref 150–400)
RBC: 4.46 MIL/uL (ref 3.87–5.11)
RDW: 13.6 % (ref 11.5–15.5)
WBC: 8.6 10*3/uL (ref 4.0–10.5)
nRBC: 0 % (ref 0.0–0.2)

## 2022-04-29 LAB — RAPID URINE DRUG SCREEN, HOSP PERFORMED
Amphetamines: NOT DETECTED
Barbiturates: NOT DETECTED
Benzodiazepines: NOT DETECTED
Cocaine: NOT DETECTED
Opiates: NOT DETECTED
Tetrahydrocannabinol: NOT DETECTED

## 2022-04-29 LAB — COMPREHENSIVE METABOLIC PANEL
ALT: 15 U/L (ref 0–44)
AST: 20 U/L (ref 15–41)
Albumin: 3.6 g/dL (ref 3.5–5.0)
Alkaline Phosphatase: 68 U/L (ref 38–126)
Anion gap: 10 (ref 5–15)
BUN: 20 mg/dL (ref 6–20)
CO2: 23 mmol/L (ref 22–32)
Calcium: 9.1 mg/dL (ref 8.9–10.3)
Chloride: 106 mmol/L (ref 98–111)
Creatinine, Ser: 1 mg/dL (ref 0.44–1.00)
GFR, Estimated: >60 mL/min (ref >60)
Glucose, Bld: 115 mg/dL — ABNORMAL HIGH (ref 70–99)
Potassium: 3.8 mmol/L (ref 3.5–5.1)
Sodium: 139 mmol/L (ref 135–145)
Total Bilirubin: 0.7 mg/dL (ref 0.3–1.2)
Total Protein: 6.8 g/dL (ref 6.5–8.1)

## 2022-04-29 LAB — AMMONIA: Ammonia: 22 umol/L (ref 9–35)

## 2022-04-29 LAB — LACTIC ACID, PLASMA: Lactic Acid, Venous: 1.1 mmol/L (ref 0.5–1.9)

## 2022-04-29 LAB — ETHANOL: Alcohol, Ethyl (B): <10 mg/dL (ref <10)

## 2022-04-29 MED ORDER — IBUPROFEN 400 MG PO TABS
600.0000 mg | ORAL_TABLET | Freq: Once | ORAL | Status: AC
  Administered 2022-04-29: 600 mg via ORAL
  Filled 2022-04-29: qty 1

## 2022-04-29 NOTE — ED Triage Notes (Signed)
BIB [facility] from home. EMS called for abd pain. Pt speaking in 3rd person, confused speech, found laying on ground. Able to stand up/ambulate on own. Does not remember falling. No neck pain, no headache, no cp, no sob.   180 palp 121CBG 100% RA 18RR 80H

## 2022-04-29 NOTE — ED Notes (Signed)
Requested urine for pt, pt updated that this is what we are waiting on to decide on her plan of care. Pt states "I am trying to pee have I gone yet?"

## 2022-04-29 NOTE — ED Notes (Signed)
Pt given more water to attempt to urinate

## 2022-04-29 NOTE — ED Provider Notes (Signed)
[facility]   CSN: [id number] Arrival date & time: 04/29/22  0011     History  Chief Complaint  Patient presents with   Altered Mental Status    Sanjuan DameKimberly H Milroy is a 61 y.o. female.  The history is provided by the patient and medical records.   61 year old female with hx of bipolar disorder, hypertension, mixed hyperlipidemia, prediabetes, presenting to the ED with altered mental status.  Per EMS, upon their arrival she was lying facedown on the floor, denies any fall but states she lowered herself off the couch and crawled to the door to let them open.  She was able to push herself up off the floor and get onto the stretcher.  She reports for the past 3 days she has been generally weak with some intermittent confusion.  States she has been falling asleep quickly, not remembering going into certain areas of her house, etc.  She is not having any pain.  She denies any fever or other recent illness.  She has not had any sick contacts.  She does report feeling ticks on her frequently.  [place] but denies pulling any ticks off of her directly.    Home Medications Prior to Admission medications   Medication Sig Start Date End Date Taking? Authorizing Provider  albuterol (VENTOLIN HFA) 108 (90 Base) MCG/ACT inhaler Inhale 1-2 puffs into the lungs every 4 (four) hours as needed for wheezing or shortness of breath. 01/24/22   Marianne Sofiaavis, Sara, PA-C  Budeson-Glycopyrrol-Formoterol (BREZTRI AEROSPHERE) 160-9-4.8 MCG/ACT AERO Inhale 2 puffs into the lungs 2 (two) times daily as needed. 02/20/22   Abigail MiyamotoPerry, Lawrence Edward, MD  buPROPion (WELLBUTRIN XL) 150 MG 24 hr tablet Take 1 tablet (150 mg total) by mouth daily. For depression 09/08/20   Armandina StammerNwoko, Agnes I, NP  divalproex (DEPAKOTE) 500 MG DR tablet Take 1 tablet (500 mg total) by mouth every 12 (twelve) hours. For mood stabilization 09/07/20   Armandina StammerNwoko, Agnes I, NP   ferrous gluconate (FERGON) 324 MG tablet Take 324 mg by mouth daily. 11/14/21   [provider]  furosemide (LASIX) 40 MG tablet Take 1 tablet by mouth twice daily 10/10/21   Cox, Fritzi MandesKirsten, MD  GAVILYTE-G 236 g solution SMARTSIG:Milliliter(s) By Mouth 01/07/22   [provider]  hydrOXYzine (ATARAX/VISTARIL) 10 MG tablet Take 2 tablets (20 mg total) by mouth 3 (three) times daily as needed for anxiety. 09/07/20   Armandina StammerNwoko, Agnes I, NP  ibuprofen (ADVIL) 600 MG tablet Take 1 tablet (600 mg total) by mouth every 6 (six) hours as needed for mild pain or moderate pain. 09/07/20   Armandina StammerNwoko, Agnes I, NP  melatonin 3 MG TABS tablet Take 1 tablet (3 mg total) by mouth at bedtime. For sleep 09/07/20   Armandina StammerNwoko, Agnes I, NP  rosuvastatin (CRESTOR) 20 MG tablet Take 1 tablet (20 mg total) by mouth daily. 11/27/21   Cox, Fritzi MandesKirsten, MD  tiZANidine (ZANAFLEX) 4 MG tablet Take 1 tablet (4 mg total) by mouth every 6 (six) hours as needed for muscle spasms. 07/26/21   Blane Oharaox, Kirsten, MD      Allergies    Patient has no known allergies.    Review of Systems   Review of Systems  Constitutional:        AMS, generalized weakness  All other systems reviewed and are negative.  Physical Exam Updated Vital Signs BP 140/88   Pulse (!) 102  Resp 18   SpO2 97%   Physical Exam Vitals and nursing note reviewed.  Constitutional:      Appearance: She is well-developed.  HENT:     Head: Normocephalic and atraumatic.  Eyes:     Conjunctiva/sclera: Conjunctivae normal.     Pupils: Pupils are equal, round, and reactive to light.  Cardiovascular:     Rate and Rhythm: Normal rate and regular rhythm.     Heart sounds: Normal heart sounds.  Pulmonary:     Effort: Pulmonary effort is normal. No respiratory distress.     Breath sounds: Normal breath sounds. No rhonchi.  Abdominal:     General: Bowel sounds are normal.     Palpations: Abdomen is soft.     Tenderness: There is no abdominal tenderness. There is no  rebound.  Musculoskeletal:        General: Normal range of motion.     Cervical back: Normal range of motion.  Skin:    General: Skin is warm and dry.     Comments: No visible tick/insect bites, no discrete rash  Neurological:     Mental Status: She is alert and oriented to person, place, and time.     Comments: AAOx3, able to answer questions and follow commands appropriately, speech is clear and goal oriented, moving extremities well, no focal deficit    ED Results / Procedures / Treatments   Labs (all labs ordered are listed, but only abnormal results are displayed) Labs Reviewed  COMPREHENSIVE METABOLIC PANEL - Abnormal; Notable for the following components:      Result Value   Glucose, Bld 115 (*)    All other components within normal limits  CBG MONITORING, ED - Abnormal; Notable for the following components:   Glucose-Capillary 139 (*)    All other components within normal limits  CULTURE, BLOOD (ROUTINE X 2)  CULTURE, BLOOD (ROUTINE X 2)  URINE CULTURE  CBC WITH DIFFERENTIAL/PLATELET  ETHANOL  AMMONIA  LACTIC ACID, PLASMA  RAPID URINE DRUG SCREEN, HOSP PERFORMED  LACTIC ACID, PLASMA  URINALYSIS, ROUTINE W REFLEX MICROSCOPIC  ROCKY MTN SPOTTED FVR ABS PNL(IGG+IGM)  LYME DISEASE SEROLOGY W/REFLEX    EKG None  Radiology DG Chest 2 View  Result Date: 04/29/2022 CLINICAL DATA:  Altered mental status. EXAM: CHEST - 2 VIEW COMPARISON:  07/03/2019. FINDINGS: The heart size and mediastinal contours are within normal limits. A moderate to large hiatal hernia is noted. No consolidation, effusion, or pneumothorax no acute osseous abnormality. IMPRESSION: 1. No active cardiopulmonary disease. 2. Moderate to large hiatal hernia. Electronically Signed   By: Thornell Sartorius M.D.   On: 04/29/2022 01:27   CT HEAD WO CONTRAST ( )  Result Date: 04/29/2022 CLINICAL DATA:  Delirium. EXAM: CT HEAD WITHOUT CONTRAST TECHNIQUE: Contiguous axial images were obtained from the base of the  skull through the vertex without intravenous contrast. RADIATION DOSE REDUCTION: This exam was performed according to the departmental dose-optimization program which includes automated exposure control, adjustment of the mA and/or kV according to patient size and/or use of iterative reconstruction technique. COMPARISON:  08/28/2020. FINDINGS: Brain: No acute intracranial hemorrhage, midline shift or mass effect. No extra-axial fluid collection. Mild atrophy is noted. Periventricular white matter hypodensities are present bilaterally. No hydrocephalus. An empty sella is noted. Vascular: No hyperdense vessel or unexpected calcification. Skull: Normal. Negative for fracture or focal lesion. Sinuses/Orbits: Mild mucosal thickening in the right maxillary sinus. No acute orbital abnormality. Other: None. IMPRESSION: 1. No acute intracranial process. 2. Atrophy  with chronic microvascular ischemic changes. Electronically Signed   By: Thornell Sartorius M.D.   On: 04/29/2022 01:26    Procedures Procedures    Medications Ordered in ED Medications - No data to display  ED Course/ Medical Decision Making/ A&P                           Medical Decision Making Amount and/or Complexity of Data Reviewed Labs: ordered. Radiology: ordered.   61 year old female presenting to the ED with generalized weakness and altered mental status.  Reportedly she has felt generally weak for about 3 days.  No specific symptoms.  She has not had any falls or syncopal events.  She has remained alert and oriented x3 with EMS, able to ambulate to stretcher without difficulty.  She is AAOx3 with my questioning as well.  She does report [place] and feels like she may have had some ticks on her but denies pulling any off of her recently.  She has not had any fevers or other infectious symptoms.  She has no apparent confusion on my exam.  We will check EKG, labs, CT head, chest x-ray. Given questionable tick  exposure, will send Lyme and RMSF testing.  Labs thus far are reassuring.  Normal WBC count, normal lactate.  CT head negative.  CXR is clear.  Lyme and RMSF in process, however no thrombocytopenia or transaminitis to suggest tickborne illness.  Awaiting UA and UDS.  5:05 AM Patient now agitated and yelling when trying to obtain UA.  She is behaving erratically but remains AAOx3, stating things that occurred upon arrival to the ED, etc.  Advised that we need to get urine sample in a timely manner as all other labs reassuring and this is holding up her disposition.  Patient given water.  She continues talking with son on the phone stating she wants to go home as she feels "great".  6:18 AM Still no production of urine after drinking water.  She is refusing in and out cath.  Continues drinking water.  States "I will keep trying".  Care will be signed out to oncoming provider.  If UA showing infection, will need treatment for this but if still AAOx3, feel she is stable for OP management and close PCP follow-up.  Final Clinical Impression(s) / ED Diagnoses Final diagnoses:  Generalized weakness    Rx / DC Orders ED Discharge Orders     None         Garlon Hatchet, PA-C 04/29/22 [id number]    Shon Baton, MD 04/29/22 [phone]

## 2022-04-29 NOTE — ED Notes (Addendum)
This RN attempted to bladder scan pt, requested that pt urinate prior to I&O cath attempt. Pt then became upset and started yelling at this RN "I feel perfectly fine I think I can just go home I can't pee". Pt then started removing self from monitor. PA Lisa to bedside, pt then started yelling "I can't pee without water, I have talked to my son and his girlfriend trying to pee". Pt then started yelling about types of water and coughing in attempt to urinate. Pt provided with pitcher of water to attempt to obtain urine sample.

## 2022-04-29 NOTE — Discharge Instructions (Addendum)
Your work-up today was reassuring.  Labs for Lyme and RMSF have been sent-- you will be notified if abnormal. Recommend to hydrate well at home, eat regularly. Please follow-up with your primary care doctor. Return here for new concerns.

## 2022-04-29 NOTE — ED Notes (Signed)
Pt found to be yelling in room and pulled call bell out of wall. Pt stated that she was having cramps but was okay as long as she pushed her legs against the side rails.

## 2022-04-29 NOTE — ED Notes (Signed)
Patient verbalizes understanding of discharge instructions. Opportunity for questioning and answers were provided. Armband removed by staff, pt discharged from ED. Pt taken to ED waiting room via wheelchair to wait for ride.  

## 2022-04-29 NOTE — ED Notes (Signed)
Attempted I&O cath once again, pt unwilling to allow cath. Pt started drinking water again stating "I used to not be allowed to pee very often so that is part of it too". PA Misty Stanley made aware.

## 2022-04-30 LAB — LYME DISEASE SEROLOGY W/REFLEX: Lyme Total Antibody EIA: NEGATIVE

## 2022-04-30 LAB — ROCKY MTN SPOTTED FVR ABS PNL(IGG+IGM)
RMSF IgG: NEGATIVE
RMSF IgM: 0.63 index (ref 0.00–0.89)

## 2022-04-30 LAB — URINE CULTURE

## 2022-05-01 ENCOUNTER — Other Ambulatory Visit: Payer: Self-pay

## 2022-05-01 DIAGNOSIS — D500 Iron deficiency anemia secondary to blood loss (chronic): Secondary | ICD-10-CM | POA: Diagnosis not present

## 2022-05-01 MED ORDER — MECLIZINE HCL 25 MG PO TABS
25.0000 mg | ORAL_TABLET | Freq: Three times a day (TID) | ORAL | 0 refills | Status: DC | PRN
Start: 2022-05-01 — End: 2022-12-02

## 2022-05-04 LAB — CULTURE, BLOOD (ROUTINE X 2)
Culture: NO GROWTH
Culture: NO GROWTH

## 2022-05-06 DIAGNOSIS — D500 Iron deficiency anemia secondary to blood loss (chronic): Secondary | ICD-10-CM | POA: Diagnosis not present

## 2022-05-06 DIAGNOSIS — R195 Other fecal abnormalities: Secondary | ICD-10-CM | POA: Diagnosis not present

## 2022-05-12 ENCOUNTER — Ambulatory Visit (INDEPENDENT_AMBULATORY_CARE_PROVIDER_SITE_OTHER): Payer: Medicare HMO | Admitting: Family Medicine

## 2022-05-12 VITALS — BP 140/90 | HR 88 | Temp 97.3°F | Resp 16 | Ht 63.0 in | Wt 255.0 lb

## 2022-05-12 DIAGNOSIS — R7303 Prediabetes: Secondary | ICD-10-CM

## 2022-05-12 NOTE — Progress Notes (Signed)
Appointment cut short due to loss of electricity.  Patient is to return for appointment next week.

## 2022-05-22 NOTE — Progress Notes (Unsigned)
Subjective:  Patient ID: Cynthia [name], female    DOB: [birth year]  Age: 61 y.o. MRN: [id number]  Chief Complaint  Patient presents with   Hyperlipidemia   Hypertension   Cystix leson on right upper eyelid: drained by me today.   Hyperlipidemia: Current medications: on pravachol 20 mg daily and vascepa 1 gm 2 capsules twice a day.  Mild persistent asthma: Albuterol prn. Has not had to use It.  Bipolar disorder: well controlled. Pt is functioning well. 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  Depakote 500 mg one bid.  Wellbutrin xl 150 mg once daily.  Sees psychiatry.     Current Outpatient Medications on File Prior to Visit  Medication Sig Dispense Refill   albuterol (VENTOLIN HFA) 108 (90 Base) MCG/ACT inhaler Inhale 1-2 puffs into the lungs every 4 (four) hours as needed for wheezing or shortness of breath. (Patient not taking: Reported on 05/12/2022) 1 each 3   Budeson-Glycopyrrol-Formoterol (BREZTRI AEROSPHERE) 160-9-4.8 MCG/ACT AERO Inhale 2 puffs into the lungs 2 (two) times daily as needed. (Patient not taking: Reported on 05/12/2022) 10.7 g 3   buPROPion (WELLBUTRIN XL) 150 MG 24 hr tablet Take 1 tablet (150 mg total) by mouth daily. For depression 30 tablet 0   divalproex (DEPAKOTE) 500 MG DR tablet Take 1 tablet (500 mg total) by mouth every 12 (twelve) hours. For mood stabilization (Patient taking differently: Take 500 mg by mouth daily. For mood stabilization) 60 tablet 0   ferrous gluconate (FERGON) 324 MG tablet Take 324 mg by mouth daily.     furosemide (LASIX) 40 MG tablet Take 1 tablet by mouth twice daily (Patient not taking: Reported on 05/12/2022) 60 tablet 0   hydrOXYzine (ATARAX/VISTARIL) 10 MG tablet Take 2 tablets (20 mg total) by mouth 3 (three) times daily as needed for anxiety. (Patient not taking: Reported on 05/12/2022) 60 tablet 0   Magnesium 250 MG TABS Take by mouth.     meclizine (ANTIVERT) 25 MG tablet Take 1 tablet (25 mg total) by mouth 3 (three) times daily as  needed for dizziness. 40 tablet 0   melatonin 3 MG TABS tablet Take 1 tablet (3 mg total) by mouth at bedtime. For sleep 30 tablet 0   rosuvastatin (CRESTOR) 20 MG tablet Take 1 tablet (20 mg total) by mouth daily. (Patient not taking: Reported on 05/12/2022) 90 tablet 3   No current facility-administered medications on file prior to visit.   Past Medical History:  Diagnosis Date   Bipolar 1 disorder (HCC)    Essential hypertension    GERD (gastroesophageal reflux disease)    No past surgical history on file.  Family History  Problem Relation Age of Onset   Cancer Mother        lung   Cancer Father        prostate   Diabetes Paternal Grandfather    Social History   Socioeconomic History   Marital status: Divorced    Spouse name: Not on file   Number of children: 3   Years of education: Not on file   Highest education level: Not on file  Occupational History   Not on file  Tobacco Use   Smoking status: Never   Smokeless tobacco: Never  Substance and Sexual Activity   Alcohol use: Not Currently    Comment: rarely   Drug use: Never   Sexual activity: Not on file  Other Topics Concern   Not on file  Social History Narrative   Not on file  Social Determinants of Health   Financial Resource Strain: Low Risk  (08/18/2021)   Overall Financial Resource Strain (CARDIA)    Difficulty of Paying Living Expenses: Not hard at all  Food Insecurity: No Food Insecurity (08/18/2021)   Hunger Vital Sign    Worried About Running Out of Food in the Last Year: Never true    Ran Out of Food in the Last Year: Never true  Transportation Needs: No Transportation Needs (08/18/2021)   PRAPARE - Administrator, Civil Service (Medical): No    Lack of Transportation (Non-Medical): No  Physical Activity: Inactive (08/18/2021)   Exercise Vital Sign    Days of Exercise per Week: 0 days    Minutes of Exercise per Session: 0 min  Stress: No Stress Concern Present (08/18/2021)   [employer] of Occupational Health - Occupational Stress Questionnaire    Feeling of Stress : Not at all  Social Connections: Socially Isolated (08/18/2021)   Social Connection and Isolation Panel [NHANES]    Frequency of Communication with Friends and Family: More than three times a week    Frequency of Social Gatherings with Friends and Family: More than three times a week    Attends Religious Services: Never    Database administrator or Organizations: No    Attends Banker Meetings: Never    Marital Status: Divorced    Review of Systems  Constitutional:  Negative for appetite change, fatigue and fever.  HENT:  Negative for congestion, ear pain, sinus pressure and sore throat.   Respiratory:  Negative for cough, chest tightness, shortness of breath and wheezing.   Cardiovascular:  Negative for chest pain and palpitations.  Gastrointestinal:  Negative for abdominal pain, constipation, diarrhea, nausea and vomiting.  Genitourinary:  Negative for dysuria and hematuria.  Musculoskeletal:  Negative for arthralgias, back pain, joint swelling and myalgias.  Skin:  Negative for rash.  Neurological:  Negative for dizziness, weakness and headaches.  Psychiatric/Behavioral:  Negative for dysphoric mood. The patient is not nervous/anxious.      Objective:  BP 130/76   Pulse 86   Temp 97.6 F (36.4 C)   Ht 5\' 3"  (1.6 m)   Wt 256 lb (116.1 kg)   SpO2 98%   BMI 45.35 kg/m      05/23/2022    7:46 AM 05/12/2022   11:11 AM 04/29/2022    8:00 AM  BP/Weight  Systolic BP 130 140 144  Diastolic BP 76 90 77  Wt. (Lbs) 256 255   BMI 45.35 kg/m2 45.17 kg/m2     Physical Exam Vitals reviewed.  Constitutional:      Appearance: Normal appearance. She is normal weight.  Cardiovascular:     Rate and Rhythm: Normal rate and regular rhythm.     Heart sounds: Normal heart sounds.  Pulmonary:     Effort: Pulmonary effort is normal.     Breath sounds: Normal breath sounds.  Abdominal:      General: Abdomen is flat. Bowel sounds are normal.     Palpations: Abdomen is soft.  Neurological:     Mental Status: She is alert and oriented to person, place, and time.  Psychiatric:        Mood and Affect: Mood normal.        Behavior: Behavior normal.     Diabetic Foot Exam - Simple   No data filed      Lab Results  Component Value Date   WBC 8.6  04/29/2022   HGB 12.8 04/29/2022   HCT 41.4 04/29/2022   PLT 308 04/29/2022   GLUCOSE 115 (H) 04/29/2022   CHOL 202 (H) 11/22/2021   TRIG 203 (H) 11/22/2021   HDL 47 11/22/2021   LDLCALC 119 (H) 11/22/2021   ALT 15 04/29/2022   AST 20 04/29/2022   NA 139 04/29/2022   K 3.8 04/29/2022   CL 106 04/29/2022   CREATININE 1.00 04/29/2022   BUN 20 04/29/2022   CO2 23 04/29/2022   TSH 0.747 11/22/2021   INR 1.0 07/03/2019   HGBA1C 5.9 (H) 11/22/2021      Assessment & Plan:   Problem List Items Addressed This Visit       Cardiovascular and Mediastinum   Essential hypertension, benign     Respiratory   Mild persistent asthma without complication - Primary     Other   Mixed hyperlipidemia   Bipolar I disorder, most recent episode (or current) depressed (HCC)   Prediabetes   Other Visit Diagnoses     Other iron deficiency anemia         .  No orders of the defined types were placed in this encounter.   No orders of the defined types were placed in this encounter.    Follow-up: No follow-ups on file.  An After Visit Summary was printed and given to the patient.    I,Lauren M Auman,acting as a scribe for Blane Ohara, MD.,have documented all relevant documentation on the behalf of Blane Ohara, MD,as directed by  Blane Ohara, MD while in the presence of Blane Ohara, MD.    Blane Ohara, MD [facility] [phone]

## 2022-05-23 ENCOUNTER — Ambulatory Visit (INDEPENDENT_AMBULATORY_CARE_PROVIDER_SITE_OTHER): Payer: Medicare HMO | Admitting: Family Medicine

## 2022-05-23 VITALS — BP 130/76 | HR 86 | Temp 97.6°F | Ht 63.0 in | Wt 256.0 lb

## 2022-05-23 DIAGNOSIS — F3130 Bipolar disorder, current episode depressed, mild or moderate severity, unspecified: Secondary | ICD-10-CM | POA: Diagnosis not present

## 2022-05-23 DIAGNOSIS — R7303 Prediabetes: Secondary | ICD-10-CM | POA: Diagnosis not present

## 2022-05-23 DIAGNOSIS — E782 Mixed hyperlipidemia: Secondary | ICD-10-CM | POA: Diagnosis not present

## 2022-05-23 DIAGNOSIS — H02823 Cysts of right eye, unspecified eyelid: Secondary | ICD-10-CM

## 2022-05-23 DIAGNOSIS — D508 Other iron deficiency anemias: Secondary | ICD-10-CM

## 2022-05-23 DIAGNOSIS — I10 Essential (primary) hypertension: Secondary | ICD-10-CM

## 2022-05-23 DIAGNOSIS — M2559 Pain in other specified joint: Secondary | ICD-10-CM

## 2022-05-23 DIAGNOSIS — R69 Illness, unspecified: Secondary | ICD-10-CM | POA: Diagnosis not present

## 2022-05-23 DIAGNOSIS — J4530 Mild persistent asthma, uncomplicated: Secondary | ICD-10-CM

## 2022-05-23 MED ORDER — IBUPROFEN 800 MG PO TABS: 800.0000 mg | ORAL_TABLET | Freq: Three times a day (TID) | ORAL | 0 refills | Status: DC | PRN

## 2022-05-24 LAB — CBC WITH DIFF/PLATELET
Basophils Absolute: 0.1 10*3/uL (ref 0.0–0.2)
Basos: 1 %
EOS (ABSOLUTE): 0.1 10*3/uL (ref 0.0–0.4)
Eos: 1 %
Hematocrit: 38.5 % (ref 34.0–46.6)
Hemoglobin: 12.6 g/dL (ref 11.1–15.9)
Immature Grans (Abs): 0 10*3/uL (ref 0.0–0.1)
Immature Granulocytes: 0 %
Lymphocytes Absolute: 1.4 10*3/uL (ref 0.7–3.1)
Lymphs: 22 %
MCH: 28.6 pg (ref 26.6–33.0)
MCHC: 32.7 g/dL (ref 31.5–35.7)
MCV: 87 fL (ref 79–97)
Monocytes Absolute: 0.5 10*3/uL (ref 0.1–0.9)
Monocytes: 7 %
Neutrophils Absolute: 4.5 10*3/uL (ref 1.4–7.0)
Neutrophils: 69 %
Platelets: 336 10*3/uL (ref 150–450)
RBC: 4.41 x10E6/uL (ref 3.77–5.28)
RDW: 13.5 % (ref 11.7–15.4)
WBC: 6.6 10*3/uL (ref 3.4–10.8)

## 2022-05-24 LAB — HEMOGLOBIN A1C
Est. average glucose Bld gHb Est-mCnc: 126 mg/dL
Hgb A1c MFr Bld: 6 % — ABNORMAL HIGH (ref 4.8–5.6)

## 2022-05-24 LAB — COMPREHENSIVE METABOLIC PANEL
ALT: 11 IU/L (ref 0–32)
AST: 13 IU/L (ref 0–40)
Albumin/Globulin Ratio: 1.6 (ref 1.2–2.2)
Albumin: 4.2 g/dL (ref 3.8–4.9)
Alkaline Phosphatase: 77 IU/L (ref 44–121)
BUN/Creatinine Ratio: 17 (ref 9–23)
BUN: 13 mg/dL (ref 6–24)
Bilirubin Total: 0.8 mg/dL (ref 0.0–1.2)
CO2: 26 mmol/L (ref 20–29)
Calcium: 9.1 mg/dL (ref 8.7–10.2)
Chloride: 102 mmol/L (ref 96–106)
Creatinine, Ser: 0.75 mg/dL (ref 0.57–1.00)
Globulin, Total: 2.7 g/dL (ref 1.5–4.5)
Glucose: 107 mg/dL — ABNORMAL HIGH (ref 70–99)
Potassium: 4.2 mmol/L (ref 3.5–5.2)
Sodium: 141 mmol/L (ref 134–144)
Total Protein: 6.9 g/dL (ref 6.0–8.5)
eGFR: 92 mL/min/{1.73_m2} (ref >59)

## 2022-05-24 LAB — CARDIOVASCULAR RISK ASSESSMENT

## 2022-05-24 LAB — LIPID PANEL
Chol/HDL Ratio: 3.6 ratio (ref 0.0–4.4)
Cholesterol, Total: 216 mg/dL — ABNORMAL HIGH (ref 100–199)
HDL: 60 mg/dL (ref >39)
LDL Chol Calc (NIH): 132 mg/dL — ABNORMAL HIGH (ref 0–99)
Triglycerides: 137 mg/dL (ref 0–149)
VLDL Cholesterol Cal: 24 mg/dL (ref 5–40)

## 2022-05-25 NOTE — Progress Notes (Signed)
Blood count normal.  Liver function normal.  Kidney function normal.  Cholesterol: LDL 132.  Triglycerides significantly improved from 203 to 137.  Is at goal.  Recommend increase Crestor 40 mg nightly. HBA1C: 6. Arthritis panel is still pending.  Thyroid level pending.

## 2022-05-26 ENCOUNTER — Other Ambulatory Visit: Payer: Self-pay

## 2022-05-26 LAB — SEDIMENTATION RATE: Sed Rate: 3 mm/hr (ref 0–40)

## 2022-05-26 LAB — TSH: TSH: 1.02 u[IU]/mL (ref 0.450–4.500)

## 2022-05-26 LAB — C-REACTIVE PROTEIN: CRP: 7 mg/L (ref 0–10)

## 2022-05-26 LAB — RHEUMATOID FACTOR: Rheumatoid fact SerPl-aCnc: <10 IU/mL (ref <14.0)

## 2022-05-26 LAB — CYCLIC CITRUL PEPTIDE ANTIBODY, IGG/IGA: Cyclic Citrullin Peptide Ab: 13 units (ref 0–19)

## 2022-05-26 MED ORDER — ROSUVASTATIN CALCIUM 40 MG PO TABS: 40.0000 mg | ORAL_TABLET | Freq: Every day | ORAL | 1 refills | Status: DC

## 2022-06-01 ENCOUNTER — Encounter: Payer: Self-pay | Admitting: Family Medicine

## 2022-06-01 DIAGNOSIS — D649 Anemia, unspecified: Secondary | ICD-10-CM | POA: Insufficient documentation

## 2022-06-01 DIAGNOSIS — M2550 Pain in unspecified joint: Secondary | ICD-10-CM | POA: Insufficient documentation

## 2022-06-01 DIAGNOSIS — H02823 Cysts of right eye, unspecified eyelid: Secondary | ICD-10-CM | POA: Insufficient documentation

## 2022-06-01 NOTE — Assessment & Plan Note (Signed)
Risks: bleeding, infection, eye injury.  Expressed understanding and wishes to proceed. Using 22G needle.Cyst lanced and lesion ejected out without issue. Bleeding easily controlled.

## 2022-06-26 ENCOUNTER — Ambulatory Visit (INDEPENDENT_AMBULATORY_CARE_PROVIDER_SITE_OTHER): Payer: Medicare HMO | Admitting: Family Medicine

## 2022-06-26 ENCOUNTER — Encounter: Payer: Self-pay | Admitting: Family Medicine

## 2022-06-26 VITALS — BP 124/78 | HR 72 | Temp 97.1°F | Resp 18 | Ht 63.0 in | Wt 252.0 lb

## 2022-06-26 DIAGNOSIS — Z7251 High risk heterosexual behavior: Secondary | ICD-10-CM

## 2022-06-26 DIAGNOSIS — R69 Illness, unspecified: Secondary | ICD-10-CM | POA: Diagnosis not present

## 2022-06-26 DIAGNOSIS — L237 Allergic contact dermatitis due to plants, except food: Secondary | ICD-10-CM

## 2022-06-26 MED ORDER — TRIAMCINOLONE ACETONIDE 40 MG/ML IJ SUSP
80.0000 mg | Freq: Once | INTRAMUSCULAR | Status: AC
  Administered 2022-06-26: 80 mg via INTRAMUSCULAR

## 2022-06-26 NOTE — Progress Notes (Signed)
Acute Office Visit  Subjective:    Patient ID: Cynthia [name], female    DOB: Aug 04, 1961, 61 y.o.   MRN: [id number]  Chief Complaint  Patient presents with   Unprotected Sex    HPI: Patient is in today for STD testing.  She had unprotected sex 1 week ago and wants to be tested.  She is having no abdominal pain or vaginal discharge or itching.    Patient has also been in poison ivy and has an itchy rash on arms/legs.  Past Medical History:  Diagnosis Date   Bipolar 1 disorder ([place])    Essential hypertension    GERD (gastroesophageal reflux disease)     History reviewed. No pertinent surgical history.  Family History  Problem Relation Age of Onset   Cancer Mother        lung   Cancer Father        prostate   Diabetes Paternal Grandfather     Social History   Socioeconomic History   Marital status: Divorced    Spouse name: Not on file   Number of children: 3   Years of education: Not on file   Highest education level: Not on file  Occupational History   Not on file  Tobacco Use   Smoking status: Never   Smokeless tobacco: Never  Substance and Sexual Activity   Alcohol use: Not Currently    Comment: rarely   Drug use: Never   Sexual activity: Not on file  Other Topics Concern   Not on file  Social History Narrative   Not on file   Social Determinants of Health   Financial Resource Strain: Low Risk  (08/18/2021)   Overall Financial Resource Strain (CARDIA)    Difficulty of Paying Living Expenses: Not hard at all  Food Insecurity: No Food Insecurity (08/18/2021)   Hunger Vital Sign    Worried About Running Out of Food in the Last Year: Never true    [place] in the Last Year: Never true  Transportation Needs: No Transportation Needs (08/18/2021)   PRAPARE - Hydrologist (Medical): No    Lack of Transportation (Non-Medical): No  Physical Activity: Inactive (08/18/2021)   Exercise Vital Sign    Days of Exercise per  Week: 0 days    Minutes of Exercise per Session: 0 min  Stress: No Stress Concern Present (08/18/2021)   [place]    Feeling of Stress : Not at all  Social Connections: Socially Isolated (08/18/2021)   Social Connection and Isolation Panel [NHANES]    Frequency of Communication with Friends and Family: More than three times a week    Frequency of Social Gatherings with Friends and Family: More than three times a week    Attends Religious Services: Never    Marine scientist or Organizations: No    Attends Archivist Meetings: Never    Marital Status: Divorced  Human resources officer Violence: Not At Risk (08/18/2021)   Humiliation, Afraid, Rape, and Kick questionnaire    Fear of Current or Ex-Partner: No    Emotionally Abused: No    Physically Abused: No    Sexually Abused: No    Outpatient Medications Prior to Visit  Medication Sig Dispense Refill   albuterol (VENTOLIN HFA) 108 (90 Base) MCG/ACT inhaler Inhale 1-2 puffs into the lungs every 4 (four) hours as needed for wheezing or shortness of breath. 1  each 3   buPROPion (WELLBUTRIN XL) 150 MG 24 hr tablet Take 1 tablet (150 mg total) by mouth daily. For depression 30 tablet 0   divalproex (DEPAKOTE) 500 MG DR tablet Take 1 tablet (500 mg total) by mouth every 12 (twelve) hours. For mood stabilization (Patient taking differently: Take 500 mg by mouth daily. For mood stabilization) 60 tablet 0   ferrous gluconate (FERGON) 324 MG tablet Take 324 mg by mouth daily.     ibuprofen (ADVIL) 800 MG tablet Take 1 tablet (800 mg total) by mouth every 8 (eight) hours as needed. 90 tablet 0   Magnesium 250 MG TABS Take by mouth.     meclizine (ANTIVERT) 25 MG tablet Take 1 tablet (25 mg total) by mouth 3 (three) times daily as needed for dizziness. 40 tablet 0   rosuvastatin (CRESTOR) 40 MG tablet Take 1 tablet (40 mg total) by mouth daily. 90 tablet 1    Budeson-Glycopyrrol-Formoterol (BREZTRI AEROSPHERE) 160-9-4.8 MCG/ACT AERO Inhale 2 puffs into the lungs 2 (two) times daily as needed. (Patient not taking: Reported on 05/12/2022) 10.7 g 3   furosemide (LASIX) 40 MG tablet Take 1 tablet by mouth twice daily (Patient not taking: Reported on 05/12/2022) 60 tablet 0   hydrOXYzine (ATARAX/VISTARIL) 10 MG tablet Take 2 tablets (20 mg total) by mouth 3 (three) times daily as needed for anxiety. (Patient not taking: Reported on 05/12/2022) 60 tablet 0   melatonin 3 MG TABS tablet Take 1 tablet (3 mg total) by mouth at bedtime. For sleep 30 tablet 0   No facility-administered medications prior to visit.    No Known Allergies  Review of Systems  Constitutional:  Negative for chills and fever.  Gastrointestinal:  Negative for abdominal pain.  Genitourinary:  Negative for vaginal discharge and vaginal pain.       Objective:    Physical Exam Vitals reviewed.  Constitutional:      Appearance: Normal appearance.  Cardiovascular:     Rate and Rhythm: Normal rate and regular rhythm.     Heart sounds: Normal heart sounds.  Pulmonary:     Effort: Pulmonary effort is normal.     Breath sounds: Normal breath sounds.  Skin:    Findings: Rash (excoriations on legs and arms. papules.) present.  Neurological:     Mental Status: She is alert and oriented to person, place, and time.  Psychiatric:        Mood and Affect: Mood normal.        Behavior: Behavior normal.     BP 124/78   Pulse 72   Temp (!) 97.1 F (36.2 C)   Resp 18   Ht _0  (1.6 m)   Wt 252 lb (114.3 kg)   BMI 44.64 kg/m  Wt Readings from Last 3 Encounters:  06/26/22 252 lb (114.3 kg)  05/23/22 256 lb (116.1 kg)  05/12/22 255 lb (115.7 kg)    Health Maintenance Due  Topic Date Due   HIV Screening  Never done   Hepatitis C Screening  Never done   PAP SMEAR-Modifier  Never done   Zoster Vaccines- Shingrix (1 of 2) Never done    There are no preventive care reminders to  display for this patient.   Lab Results  Component Value Date   TSH 1.020 05/23/2022   Lab Results  Component Value Date   WBC 6.6 05/23/2022   HGB 12.6 05/23/2022   HCT 38.5 05/23/2022   MCV 87 05/23/2022   PLT  336 05/23/2022   Lab Results  Component Value Date   NA 141 05/23/2022   K 4.2 05/23/2022   CO2 26 05/23/2022   GLUCOSE 107 (H) 05/23/2022   BUN 13 05/23/2022   CREATININE 0.75 05/23/2022   BILITOT 0.8 05/23/2022   ALKPHOS 77 05/23/2022   AST 13 05/23/2022   ALT 11 05/23/2022   PROT 6.9 05/23/2022   ALBUMIN 4.2 05/23/2022   CALCIUM 9.1 05/23/2022   ANIONGAP 10 04/29/2022   EGFR 92 05/23/2022   Lab Results  Component Value Date   CHOL 216 (H) 05/23/2022   Lab Results  Component Value Date   HDL 60 05/23/2022   Lab Results  Component Value Date   LDLCALC 132 (H) 05/23/2022   Lab Results  Component Value Date   TRIG 137 05/23/2022   Lab Results  Component Value Date   CHOLHDL 3.6 05/23/2022   Lab Results  Component Value Date   HGBA1C 6.0 (H) 05/23/2022       Assessment & Plan:   Problem List Items Addressed This Visit   None Visit Diagnoses     Poison ivy dermatitis    -  Primary   Relevant Medications   triamcinolone acetonide (KENALOG-40) injection 80 mg (Start on 06/26/2022  9:45 AM)   High risk sexual behavior, unspecified type       Relevant Orders   HIV Antibody (routine testing w rflx)   HSV(herpes simplex vrs) 1+2 ab-IgG   HCV Ab w Reflex to Quant PCR   GC/Chlamydia Probe Amp      Meds ordered this encounter  Medications   triamcinolone acetonide (KENALOG-40) injection 80 mg    Orders Placed This Encounter  Procedures   GC/Chlamydia Probe Amp   HIV Antibody (routine testing w rflx)   HSV(herpes simplex vrs) 1+2 ab-IgG   HCV Ab w Reflex to Quant PCR     Follow-up: No follow-ups on file.  An After Visit Summary was printed and given to the patient.  Rochel Brome, MD [facility] [phone]

## 2022-06-27 ENCOUNTER — Other Ambulatory Visit: Payer: Medicare HMO

## 2022-06-27 DIAGNOSIS — Z7251 High risk heterosexual behavior: Secondary | ICD-10-CM | POA: Diagnosis not present

## 2022-06-27 DIAGNOSIS — R69 Illness, unspecified: Secondary | ICD-10-CM | POA: Diagnosis not present

## 2022-06-28 LAB — HSV(HERPES SIMPLEX VRS) I + II AB-IGG
HSV 1 Glycoprotein G Ab, IgG: 14.3 index — ABNORMAL HIGH (ref 0.00–0.90)
HSV 2 IgG, Type Spec: 6.47 index — ABNORMAL HIGH (ref 0.00–0.90)

## 2022-06-28 LAB — HCV INTERPRETATION

## 2022-06-28 LAB — HIV ANTIBODY (ROUTINE TESTING W REFLEX): HIV Screen 4th Generation wRfx: NONREACTIVE

## 2022-06-28 LAB — HCV AB W REFLEX TO QUANT PCR: HCV Ab: NONREACTIVE

## 2022-06-30 LAB — GC/CHLAMYDIA PROBE AMP
Chlamydia trachomatis, NAA: NEGATIVE
Neisseria Gonorrhoeae by PCR: NEGATIVE

## 2022-07-14 ENCOUNTER — Ambulatory Visit: Payer: Medicare HMO | Admitting: Family Medicine

## 2022-07-21 ENCOUNTER — Ambulatory Visit (INDEPENDENT_AMBULATORY_CARE_PROVIDER_SITE_OTHER): Payer: Medicare HMO | Admitting: Family Medicine

## 2022-07-21 ENCOUNTER — Encounter: Payer: Self-pay | Admitting: Family Medicine

## 2022-07-21 VITALS — BP 140/90 | HR 83 | Temp 98.5°F | Resp 15 | Ht 63.0 in | Wt 252.0 lb

## 2022-07-21 DIAGNOSIS — M542 Cervicalgia: Secondary | ICD-10-CM

## 2022-07-21 DIAGNOSIS — M545 Low back pain, unspecified: Secondary | ICD-10-CM

## 2022-07-21 MED ORDER — IBUPROFEN 800 MG PO TABS: 800.0000 mg | ORAL_TABLET | Freq: Three times a day (TID) | ORAL | 0 refills | Status: DC | PRN

## 2022-07-21 NOTE — Progress Notes (Signed)
Subjective:  Patient ID: Cynthia [name], female    DOB: 12-03-1961  Age: 61 y.o. MRN: [id number]  Chief Complaint  Patient presents with   Back Pain    HPI Patient came 6 weeks ago for joint and cramping pain. She mentioned magnesium and ibuprofen is helping. Today, she has aching pain in cervical spine.    Current Outpatient Medications on File Prior to Visit  Medication Sig Dispense Refill   albuterol (VENTOLIN HFA) 108 (90 Base) MCG/ACT inhaler Inhale 1-2 puffs into the lungs every 4 (four) hours as needed for wheezing or shortness of breath. 1 each 3   buPROPion (WELLBUTRIN XL) 150 MG 24 hr tablet Take 1 tablet (150 mg total) by mouth daily. For depression 30 tablet 0   divalproex (DEPAKOTE) 500 MG DR tablet Take 1 tablet (500 mg total) by mouth every 12 (twelve) hours. For mood stabilization (Patient taking differently: Take 500 mg by mouth daily. For mood stabilization) 60 tablet 0   ferrous gluconate (FERGON) 324 MG tablet Take 324 mg by mouth daily.     Magnesium 250 MG TABS Take by mouth.     rosuvastatin (CRESTOR) 40 MG tablet Take 1 tablet (40 mg total) by mouth daily. 90 tablet 1   Budeson-Glycopyrrol-Formoterol (BREZTRI AEROSPHERE) 160-9-4.8 MCG/ACT AERO Inhale 2 puffs into the lungs 2 (two) times daily as needed. (Patient not taking: Reported on 07/21/2022) 10.7 g 3   meclizine (ANTIVERT) 25 MG tablet Take 1 tablet (25 mg total) by mouth 3 (three) times daily as needed for dizziness. (Patient not taking: Reported on 07/21/2022) 40 tablet 0   No current facility-administered medications on file prior to visit.   Past Medical History:  Diagnosis Date   Bipolar 1 disorder ([facility])    Essential hypertension    GERD (gastroesophageal reflux disease)    Past Surgical History:  Procedure Laterality Date   NO PAST SURGERIES      Family History  Problem Relation Age of Onset   Cancer Mother        lung   Cancer Father        prostate   Diabetes Paternal Grandfather     Social History   Socioeconomic History   Marital status: Divorced    Spouse name: Not on file   Number of children: 3   Years of education: Not on file   Highest education level: Not on file  Occupational History   Not on file  Tobacco Use   Smoking status: Never   Smokeless tobacco: Never  Vaping Use   Vaping Use: Never used  Substance and Sexual Activity   Alcohol use: Not Currently    Comment: rarely   Drug use: Never   Sexual activity: Yes    Partners: Male  Other Topics Concern   Not on file  Social History Narrative   Not on file   Social Determinants of Health   Financial Resource Strain: Low Risk  (08/18/2021)   Overall Financial Resource Strain (CARDIA)    Difficulty of Paying Living Expenses: Not hard at all  Food Insecurity: No Food Insecurity (08/18/2021)   Hunger Vital Sign    Worried About Running Out of Food in the Last Year: Never true    Ran Out of Food in the Last Year: Never true  Transportation Needs: No Transportation Needs (08/18/2021)   PRAPARE - Administrator, Civil Service (Medical): No    Lack of Transportation (Non-Medical): No  Physical Activity: Inactive (  08/18/2021)   Exercise Vital Sign    Days of Exercise per Week: 0 days    Minutes of Exercise per Session: 0 min  Stress: No Stress Concern Present (08/18/2021)   [employer] of Occupational Health - Occupational Stress Questionnaire    Feeling of Stress : Not at all  Social Connections: Socially Isolated (08/18/2021)   Social Connection and Isolation Panel [NHANES]    Frequency of Communication with Friends and Family: More than three times a week    Frequency of Social Gatherings with Friends and Family: More than three times a week    Attends Religious Services: Never    Database administrator or Organizations: No    Attends Banker Meetings: Never    Marital Status: Divorced    Review of Systems  Constitutional:  Negative for chills, fatigue and  fever.  HENT:  Negative for congestion, ear pain and sore throat.   Respiratory:  Negative for cough and shortness of breath.   Cardiovascular:  Negative for chest pain and palpitations.  Gastrointestinal:  Negative for abdominal pain, constipation, diarrhea, nausea and vomiting.  Endocrine: Negative for polydipsia, polyphagia and polyuria.  Genitourinary:  Negative for difficulty urinating and dysuria.  Musculoskeletal:  Positive for back pain. Negative for arthralgias and myalgias.  Skin:  Negative for rash.  Neurological:  Negative for headaches.  Psychiatric/Behavioral:  Negative for dysphoric mood. The patient is not nervous/anxious.      Objective:  BP (!) 140/90   Pulse 83   Temp 98.5 F (36.9 C)   Resp 15   Ht 5\' 3"  (1.6 m)   Wt 252 lb (114.3 kg)   SpO2 98%   BMI 44.64 kg/m      07/21/2022    8:45 AM 06/26/2022    9:16 AM 05/23/2022    7:46 AM  BP/Weight  Systolic BP 140 124 130  Diastolic BP 90 78 76  Wt. (Lbs) 252 252 256  BMI 44.64 kg/m2 44.64 kg/m2 45.35 kg/m2    Physical Exam Vitals reviewed.  Constitutional:      Appearance: Normal appearance. She is normal weight.  Neck:     Vascular: No carotid bruit.  Cardiovascular:     Rate and Rhythm: Normal rate and regular rhythm.     Heart sounds: Normal heart sounds.  Pulmonary:     Effort: Pulmonary effort is normal. No respiratory distress.     Breath sounds: Normal breath sounds.  Abdominal:     General: Abdomen is flat. Bowel sounds are normal.     Palpations: Abdomen is soft.     Tenderness: There is no abdominal tenderness.  Musculoskeletal:        General: No tenderness. Normal range of motion.  Neurological:     Mental Status: She is alert and oriented to person, place, and time.  Psychiatric:        Mood and Affect: Mood normal.        Behavior: Behavior normal.        Lab Results  Component Value Date   WBC 6.6 05/23/2022   HGB 12.6 05/23/2022   HCT 38.5 05/23/2022   PLT 336  05/23/2022   GLUCOSE 107 (H) 05/23/2022   CHOL 216 (H) 05/23/2022   TRIG 137 05/23/2022   HDL 60 05/23/2022   LDLCALC 132 (H) 05/23/2022   ALT 11 05/23/2022   AST 13 05/23/2022   NA 141 05/23/2022   K 4.2 05/23/2022   CL 102 05/23/2022  CREATININE 0.75 05/23/2022   BUN 13 05/23/2022   CO2 26 05/23/2022   TSH 1.020 05/23/2022   INR 1.0 07/03/2019   HGBA1C 6.0 (H) 05/23/2022      Assessment & Plan:   Problem List Items Addressed This Visit       Other   Neck pain - Primary   Lumbar back pain   Relevant Medications   ibuprofen (ADVIL) 800 MG tablet    Meds ordered this encounter  Medications   ibuprofen (ADVIL) 800 MG tablet    Sig: Take 1 tablet (800 mg total) by mouth every 8 (eight) hours as needed.    Dispense:  270 tablet    Refill:  0    Follow-up: Return in about 5 weeks (around 08/26/2022) for chronic fasting.  An After Visit Summary was printed and given to the patient.  Blane Ohara, MD [facility] [phone]

## 2022-08-19 ENCOUNTER — Ambulatory Visit (INDEPENDENT_AMBULATORY_CARE_PROVIDER_SITE_OTHER): Payer: Medicare HMO

## 2022-08-19 VITALS — BP 128/82 | HR 78 | Resp 16 | Ht 63.0 in | Wt 254.8 lb

## 2022-08-19 DIAGNOSIS — Z0000 Encounter for general adult medical examination without abnormal findings: Secondary | ICD-10-CM

## 2022-08-19 DIAGNOSIS — Z23 Encounter for immunization: Secondary | ICD-10-CM | POA: Diagnosis not present

## 2022-08-25 ENCOUNTER — Encounter: Payer: Self-pay | Admitting: Family Medicine

## 2022-08-25 ENCOUNTER — Ambulatory Visit (INDEPENDENT_AMBULATORY_CARE_PROVIDER_SITE_OTHER): Payer: Medicare HMO | Admitting: Family Medicine

## 2022-08-25 VITALS — BP 172/102 | HR 94 | Temp 97.2°F | Resp 15 | Ht 63.0 in | Wt 253.0 lb

## 2022-08-25 DIAGNOSIS — R69 Illness, unspecified: Secondary | ICD-10-CM | POA: Diagnosis not present

## 2022-08-25 DIAGNOSIS — M546 Pain in thoracic spine: Secondary | ICD-10-CM | POA: Diagnosis not present

## 2022-08-25 DIAGNOSIS — R030 Elevated blood-pressure reading, without diagnosis of hypertension: Secondary | ICD-10-CM

## 2022-08-25 DIAGNOSIS — J4530 Mild persistent asthma, uncomplicated: Secondary | ICD-10-CM | POA: Diagnosis not present

## 2022-08-25 DIAGNOSIS — R7303 Prediabetes: Secondary | ICD-10-CM | POA: Diagnosis not present

## 2022-08-25 DIAGNOSIS — K219 Gastro-esophageal reflux disease without esophagitis: Secondary | ICD-10-CM | POA: Diagnosis not present

## 2022-08-25 DIAGNOSIS — G8929 Other chronic pain: Secondary | ICD-10-CM | POA: Diagnosis not present

## 2022-08-25 DIAGNOSIS — F3130 Bipolar disorder, current episode depressed, mild or moderate severity, unspecified: Secondary | ICD-10-CM

## 2022-08-25 DIAGNOSIS — E782 Mixed hyperlipidemia: Secondary | ICD-10-CM

## 2022-08-25 MED ORDER — CYCLOBENZAPRINE HCL 5 MG PO TABS: 5.0000 mg | ORAL_TABLET | Freq: Three times a day (TID) | ORAL | 1 refills | Status: DC | PRN

## 2022-08-25 NOTE — Progress Notes (Unsigned)
Subjective:  Patient ID: Cynthia [name], female    DOB: 1961-08-11  Age: 61 y.o. MRN: [id number]  Chief Complaint  Patient presents with   Prediabetes   Hyperlipidemia    HPI Hyperlipidemia: Patient is taking Rosuvastatin 40 mg daily.  Asthma: Patient was using Breztri 2 puff twice a day PRN,  Albuterol inhaler 1-2 puff every 4 hours PRN, however, she is not using them lately.   Bipolar Disorder: Takes Wellbutrin 150 mg daily, She is on Depakote 500 mg every 12 hours. Patient sees Rejeana Brock, NP.  Gets thoracic back pain due to movements with taking care of her home. Taking ibuprofen 800 mg three times a day.   Elevated BP today. Repeat bp worsened. Her bp has done well in the past.   Current Outpatient Medications on File Prior to Visit  Medication Sig Dispense Refill   albuterol (VENTOLIN HFA) 108 (90 Base) MCG/ACT inhaler Inhale 1-2 puffs into the lungs every 4 (four) hours as needed for wheezing or shortness of breath. 1 each 3   buPROPion (WELLBUTRIN XL) 150 MG 24 hr tablet Take 1 tablet (150 mg total) by mouth daily. For depression 30 tablet 0   divalproex (DEPAKOTE) 500 MG DR tablet Take 1 tablet (500 mg total) by mouth every 12 (twelve) hours. For mood stabilization (Patient taking differently: Take 500 mg by mouth daily. For mood stabilization) 60 tablet 0   ferrous gluconate (FERGON) 324 MG tablet Take 324 mg by mouth daily.     ibuprofen (ADVIL) 800 MG tablet Take 1 tablet (800 mg total) by mouth every 8 (eight) hours as needed. 270 tablet 0   Magnesium 250 MG TABS Take by mouth.     meclizine (ANTIVERT) 25 MG tablet Take 1 tablet (25 mg total) by mouth 3 (three) times daily as needed for dizziness. 40 tablet 0   rosuvastatin (CRESTOR) 40 MG tablet Take 1 tablet (40 mg total) by mouth daily. 90 tablet 1   Budeson-Glycopyrrol-Formoterol (BREZTRI AEROSPHERE) 160-9-4.8 MCG/ACT AERO Inhale 2 puffs into the lungs 2 (two) times daily as needed. 10.7 g 3   No current  facility-administered medications on file prior to visit.   Past Medical History:  Diagnosis Date   Bipolar 1 disorder ([place])    Essential hypertension    GERD (gastroesophageal reflux disease)    Past Surgical History:  Procedure Laterality Date   NO PAST SURGERIES      Family History  Problem Relation Age of Onset   Cancer Mother        lung   Cancer Father        prostate   Diabetes Paternal Grandfather    Social History   Socioeconomic History   Marital status: Divorced    Spouse name: Not on file   Number of children: 3   Years of education: Not on file   Highest education level: Not on file  Occupational History   Not on file  Tobacco Use   Smoking status: Never   Smokeless tobacco: Never  Vaping Use   Vaping Use: Never used  Substance and Sexual Activity   Alcohol use: Not Currently    Comment: rarely   Drug use: Never   Sexual activity: Yes    Partners: Male  Other Topics Concern   Not on file  Social History Narrative   Not on file   Social Determinants of Health   Financial Resource Strain: Low Risk  (08/18/2021)   Overall Financial Resource Strain (  CARDIA)    Difficulty of Paying Living Expenses: Not hard at all  Food Insecurity: No Food Insecurity (08/18/2021)   Hunger Vital Sign    Worried About Running Out of Food in the Last Year: Never true    Ran Out of Food in the Last Year: Never true  Transportation Needs: No Transportation Needs (08/18/2021)   PRAPARE - Hydrologist (Medical): No    Lack of Transportation (Non-Medical): No  Physical Activity: Inactive (08/18/2021)   Exercise Vital Sign    Days of Exercise per Week: 0 days    Minutes of Exercise per Session: 0 min  Stress: No Stress Concern Present (08/18/2021)   [place]    Feeling of Stress : Not at all  Social Connections: Socially Isolated (08/18/2021)   Social Connection and Isolation  Panel [NHANES]    Frequency of Communication with Friends and Family: More than three times a week    Frequency of Social Gatherings with Friends and Family: More than three times a week    Attends Religious Services: Never    Marine scientist or Organizations: No    Attends Archivist Meetings: Never    Marital Status: Divorced    Review of Systems  Constitutional:  Negative for chills, fatigue and fever.  HENT:  Negative for congestion, ear pain and sore throat.   Respiratory:  Negative for cough and shortness of breath.   Cardiovascular:  Negative for chest pain and palpitations.  Gastrointestinal:  Positive for diarrhea (sometimes loose. 4-5 stools per day.). Negative for abdominal pain, constipation, nausea and vomiting.  Endocrine: Negative for polydipsia, polyphagia and polyuria.  Genitourinary:  Negative for difficulty urinating and dysuria.  Musculoskeletal:  Positive for back pain. Negative for arthralgias and myalgias.  Skin:  Negative for rash.  Neurological:  Negative for headaches.  Psychiatric/Behavioral:  Negative for dysphoric mood. The patient is not nervous/anxious.      Objective:  BP (!) 172/102   Pulse 94   Temp (!) 97.2 F (36.2 C)   Resp 15   Ht 5\' 3"  (1.6 m)   Wt 253 lb (114.8 kg)   LMP  (LMP Unknown)   SpO2 96%   BMI 44.82 kg/m      08/25/2022    8:32 AM 08/25/2022    8:04 AM 08/19/2022    2:12 PM  BP/Weight  Systolic BP [id number]  Diastolic BP A999333 90 82  Wt. (Lbs)  253 254.8  BMI  44.82 kg/m2 45.14 kg/m2    Physical Exam Vitals reviewed.  Constitutional:      Appearance: Normal appearance. She is obese.  Neck:     Vascular: No carotid bruit.  Cardiovascular:     Rate and Rhythm: Normal rate and regular rhythm.     Heart sounds: Normal heart sounds.  Pulmonary:     Effort: Pulmonary effort is normal. No respiratory distress.     Breath sounds: Normal breath sounds.  Abdominal:     General: Abdomen is flat. Bowel  sounds are normal.     Palpations: Abdomen is soft.     Tenderness: There is no abdominal tenderness.  Musculoskeletal:        General: Tenderness (left thoracic paraspinal muscles.) present.  Neurological:     Mental Status: She is alert and oriented to person, place, and time.  Psychiatric:        Mood and Affect: Mood  normal.        Behavior: Behavior normal.    Diabetic Foot Exam - Simple   No data filed     Lab Results  Component Value Date   WBC 6.7 08/25/2022   HGB 12.2 08/25/2022   HCT 38.7 08/25/2022   PLT 321 08/25/2022   GLUCOSE 113 (H) 08/25/2022   CHOL 152 08/25/2022   TRIG 134 08/25/2022   HDL 55 08/25/2022   LDLCALC 74 08/25/2022   ALT 12 08/25/2022   AST 14 08/25/2022   NA 146 (H) 08/25/2022   K 5.1 08/25/2022   CL 104 08/25/2022   CREATININE 0.77 08/25/2022   BUN 10 08/25/2022   CO2 27 08/25/2022   TSH 0.963 08/25/2022   INR 1.0 07/03/2019   HGBA1C 5.9 (H) 08/25/2022      Assessment & Plan:   Problem List Items Addressed This Visit       Respiratory   Mild persistent asthma without complication     Digestive   Gastroesophageal reflux disease without esophagitis    The current medical regimen is effective;  continue present plan and medications.      Relevant Orders   CBC with Differential/Platelet (Completed)     Other   Mixed hyperlipidemia    Well controlled.  No changes to medicines.  Continue to work on eating a healthy diet and exercise.  Labs drawn today.       Relevant Orders   Comprehensive metabolic panel (Completed)   Lipid panel (Completed)   TSH (Completed)   Bipolar I disorder, most recent episode (or current) depressed ([place])    The current medical regimen is effective;  continue present plan and medications.      Prediabetes - Primary    Hemoglobin A1c 6.0%, 3 month avg of blood sugars, is in prediabetic range.  In order to prevent progression to diabetes, recommend low carb diet and regular exercise       Relevant Orders   Hemoglobin A1c (Completed)   Elevated BP without diagnosis of hypertension    Patient needs to return for a nurse check for bp.      Chronic left-sided thoracic back pain    Start on cyclobenzaprine (flexeril) 5 mg three times a day as needed thoracic back pain.       Relevant Medications   cyclobenzaprine (FLEXERIL) 5 MG tablet  .  Meds ordered this encounter  Medications   cyclobenzaprine (FLEXERIL) 5 MG tablet    Sig: Take 1 tablet (5 mg total) by mouth 3 (three) times daily as needed for muscle spasms.    Dispense:  30 tablet    Refill:  1    Orders Placed This Encounter  Procedures   Comprehensive metabolic panel   Hemoglobin A1c   Lipid panel   CBC with Differential/Platelet   TSH   Cardiovascular Risk Assessment     Follow-up: Return in about 4 weeks (around 09/22/2022) for BP visit/PAP.  An After Visit Summary was printed and given to the patient.  Rochel Brome, MD [facility] [phone]

## 2022-08-25 NOTE — Assessment & Plan Note (Signed)
The current medical regimen is effective;  continue present plan and medications.  

## 2022-08-25 NOTE — Assessment & Plan Note (Signed)
Well controlled.  ?No changes to medicines.  ?Continue to work on eating a healthy diet and exercise.  ?Labs drawn today.  ?

## 2022-08-25 NOTE — Assessment & Plan Note (Signed)
Hemoglobin A1c 6.0%, 3 month avg of blood sugars, is in prediabetic range.  In order to prevent progression to diabetes, recommend low carb diet and regular exercise 

## 2022-08-25 NOTE — Patient Instructions (Signed)
Start on cyclobenzaprine (flexeril) 5 mg three times a day as needed thoracic back pain.  

## 2022-08-26 LAB — CBC WITH DIFFERENTIAL/PLATELET
Basophils Absolute: 0.1 10*3/uL (ref 0.0–0.2)
Basos: 1 %
EOS (ABSOLUTE): 0.2 10*3/uL (ref 0.0–0.4)
Eos: 3 %
Hematocrit: 38.7 % (ref 34.0–46.6)
Hemoglobin: 12.2 g/dL (ref 11.1–15.9)
Immature Grans (Abs): 0 10*3/uL (ref 0.0–0.1)
Immature Granulocytes: 0 %
Lymphocytes Absolute: 1.4 10*3/uL (ref 0.7–3.1)
Lymphs: 21 %
MCH: 28.5 pg (ref 26.6–33.0)
MCHC: 31.5 g/dL (ref 31.5–35.7)
MCV: 90 fL (ref 79–97)
Monocytes Absolute: 0.5 10*3/uL (ref 0.1–0.9)
Monocytes: 7 %
Neutrophils Absolute: 4.5 10*3/uL (ref 1.4–7.0)
Neutrophils: 68 %
Platelets: 321 10*3/uL (ref 150–450)
RBC: 4.28 x10E6/uL (ref 3.77–5.28)
RDW: 13.5 % (ref 11.7–15.4)
WBC: 6.7 10*3/uL (ref 3.4–10.8)

## 2022-08-26 LAB — COMPREHENSIVE METABOLIC PANEL
ALT: 12 IU/L (ref 0–32)
AST: 14 IU/L (ref 0–40)
Albumin/Globulin Ratio: 1.5 (ref 1.2–2.2)
Albumin: 4 g/dL (ref 3.8–4.9)
Alkaline Phosphatase: 80 IU/L (ref 44–121)
BUN/Creatinine Ratio: 13 (ref 9–23)
BUN: 10 mg/dL (ref 6–24)
Bilirubin Total: 0.5 mg/dL (ref 0.0–1.2)
CO2: 27 mmol/L (ref 20–29)
Calcium: 9.4 mg/dL (ref 8.7–10.2)
Chloride: 104 mmol/L (ref 96–106)
Creatinine, Ser: 0.77 mg/dL (ref 0.57–1.00)
Globulin, Total: 2.6 g/dL (ref 1.5–4.5)
Glucose: 113 mg/dL — ABNORMAL HIGH (ref 70–99)
Potassium: 5.1 mmol/L (ref 3.5–5.2)
Sodium: 146 mmol/L — ABNORMAL HIGH (ref 134–144)
Total Protein: 6.6 g/dL (ref 6.0–8.5)
eGFR: 89 mL/min/{1.73_m2} (ref >59)

## 2022-08-26 LAB — LIPID PANEL
Chol/HDL Ratio: 2.8 ratio (ref 0.0–4.4)
Cholesterol, Total: 152 mg/dL (ref 100–199)
HDL: 55 mg/dL (ref >39)
LDL Chol Calc (NIH): 74 mg/dL (ref 0–99)
Triglycerides: 134 mg/dL (ref 0–149)
VLDL Cholesterol Cal: 23 mg/dL (ref 5–40)

## 2022-08-26 LAB — TSH: TSH: 0.963 u[IU]/mL (ref 0.450–4.500)

## 2022-08-26 LAB — CARDIOVASCULAR RISK ASSESSMENT

## 2022-08-26 LAB — HEMOGLOBIN A1C
Est. average glucose Bld gHb Est-mCnc: 123 mg/dL
Hgb A1c MFr Bld: 5.9 % — ABNORMAL HIGH (ref 4.8–5.6)

## 2022-08-26 NOTE — Progress Notes (Signed)
Blood count normal.  Liver function normal.  Kidney function normal.  Thyroid function normal.  Cholesterol: Great! Improved significantly. The current medical regimen is effective;  continue present plan and medications. HBA1C:stable at 5.9. prediabetes stable.

## 2022-08-27 DIAGNOSIS — Z23 Encounter for immunization: Secondary | ICD-10-CM

## 2022-08-27 DIAGNOSIS — Z0000 Encounter for general adult medical examination without abnormal findings: Secondary | ICD-10-CM | POA: Diagnosis not present

## 2022-08-27 DIAGNOSIS — G8929 Other chronic pain: Secondary | ICD-10-CM | POA: Insufficient documentation

## 2022-08-27 DIAGNOSIS — R030 Elevated blood-pressure reading, without diagnosis of hypertension: Secondary | ICD-10-CM | POA: Insufficient documentation

## 2022-08-27 NOTE — Assessment & Plan Note (Signed)
Start on cyclobenzaprine (flexeril) 5 mg three times a day as needed thoracic back pain.

## 2022-08-27 NOTE — Progress Notes (Signed)
Subjective:   MKENZIE DOTTS is a 61 y.o. female who presents for Medicare Annual (Subsequent) preventive examination.  This wellness visit is conducted by a nurse.  The patient's medications were reviewed and reconciled since the patient's last visit.  History details were provided by the patient.  The history appears to be reliable.    Medical History: Patient history and Family history was reviewed  Medications, Allergies, and preventative health maintenance was reviewed and updated.   Cardiac Risk Factors include: diabetes mellitus;obesity (BMI >30kg/m2)     Objective:    Today's Vitals   08/19/22 1412  BP: 128/82  Pulse: 78  Resp: 16  Weight: 254 lb 12.8 oz (115.6 kg)  Height: 5\' 3"  (1.6 m)   Body mass index is 45.14 kg/m.     04/29/2022    2:08 AM 08/18/2021    8:39 AM 08/29/2020    1:08 AM 08/26/2020    5:26 PM 07/04/2019    2:08 AM  Advanced Directives  Does Patient Have a Medical Advance Directive? No Yes  No Unable to assess, patient is non-responsive or altered mental status  Type of Advance Directive  Living will     Does patient want to make changes to medical advance directive?  No - Patient declined     Would patient like information on creating a medical advance directive?    No - Patient declined      Information is confidential and restricted. Go to Review Flowsheets to unlock data.    Current Medications (verified) Outpatient Encounter Medications as of 08/19/2022  Medication Sig   albuterol (VENTOLIN HFA) 108 (90 Base) MCG/ACT inhaler Inhale 1-2 puffs into the lungs every 4 (four) hours as needed for wheezing or shortness of breath.   Budeson-Glycopyrrol-Formoterol (BREZTRI AEROSPHERE) 160-9-4.8 MCG/ACT AERO Inhale 2 puffs into the lungs 2 (two) times daily as needed.   buPROPion (WELLBUTRIN XL) 150 MG 24 hr tablet Take 1 tablet (150 mg total) by mouth daily. For depression   cyclobenzaprine (FLEXERIL) 5 MG tablet Take 1 tablet (5 mg total) by mouth 3  (three) times daily as needed for muscle spasms.   divalproex (DEPAKOTE) 500 MG DR tablet Take 1 tablet (500 mg total) by mouth every 12 (twelve) hours. For mood stabilization (Patient taking differently: Take 500 mg by mouth daily. For mood stabilization)   ferrous gluconate (FERGON) 324 MG tablet Take 324 mg by mouth daily.   ibuprofen (ADVIL) 800 MG tablet Take 1 tablet (800 mg total) by mouth every 8 (eight) hours as needed.   Magnesium 250 MG TABS Take by mouth.   meclizine (ANTIVERT) 25 MG tablet Take 1 tablet (25 mg total) by mouth 3 (three) times daily as needed for dizziness.   rosuvastatin (CRESTOR) 40 MG tablet Take 1 tablet (40 mg total) by mouth daily.   No facility-administered encounter medications on file as of 08/19/2022.    Allergies (verified) Patient has no known allergies.   History: Past Medical History:  Diagnosis Date   Bipolar 1 disorder ([place])    Essential hypertension    GERD (gastroesophageal reflux disease)    Past Surgical History:  Procedure Laterality Date   NO PAST SURGERIES     Family History  Problem Relation Age of Onset   Cancer Mother        lung   Cancer Father        prostate   Diabetes Paternal Grandfather    Social History   Socioeconomic History  Marital status: Divorced    Spouse name: Not on file   Number of children: 3   Years of education: Not on file   Highest education level: Not on file  Occupational History   Not on file  Tobacco Use   Smoking status: Never   Smokeless tobacco: Never  Vaping Use   Vaping Use: Never used  Substance and Sexual Activity   Alcohol use: Not Currently    Comment: rarely   Drug use: Never   Sexual activity: Yes    Partners: Male  Other Topics Concern   Not on file  Social History Narrative   Not on file   Social Determinants of Health   Financial Resource Strain: Low Risk  (08/18/2021)   Overall Financial Resource Strain (CARDIA)    Difficulty of Paying Living Expenses: Not hard  at all  Food Insecurity: No Food Insecurity (08/18/2021)   Hunger Vital Sign    Worried About Running Out of Food in the Last Year: Never true    Ran Out of Food in the Last Year: Never true  Transportation Needs: No Transportation Needs (08/18/2021)   PRAPARE - Administrator, Civil Service (Medical): No    Lack of Transportation (Non-Medical): No  Physical Activity: Inactive (08/18/2021)   Exercise Vital Sign    Days of Exercise per Week: 0 days    Minutes of Exercise per Session: 0 min  Stress: No Stress Concern Present (08/18/2021)   [employer] of Occupational Health - Occupational Stress Questionnaire    Feeling of Stress : Not at all  Social Connections: Socially Isolated (08/18/2021)   Social Connection and Isolation Panel [NHANES]    Frequency of Communication with Friends and Family: More than three times a week    Frequency of Social Gatherings with Friends and Family: More than three times a week    Attends Religious Services: Never    Database administrator or Organizations: No    Attends Engineer, structural: Never    Marital Status: Divorced    Tobacco Counseling Counseling given: Not Answered   Clinical Intake:  Pre-visit preparation completed: Yes Pain : No/denies pain   BMI - recorded: 44.82 Nutritional Status: BMI > 30  Obese Nutritional Risks: None Diabetes: Yes CBG done?: No Did pt. bring in CBG monitor from home?: No  How often do you need to have someone help you when you read instructions, pamphlets, or other written materials from your doctor or pharmacy?: 2 - Rarely Interpreter Needed?: No    Activities of Daily Living    08/15/2022    8:58 AM  In your present state of health, do you have any difficulty performing the following activities:  Hearing? 1  Vision? 0  Difficulty concentrating or making decisions? 0  Walking or climbing stairs? 1  Dressing or bathing? 0  Doing errands, shopping? 0  Preparing Food and  eating ? N  Using the Toilet? N  In the past six months, have you accidently leaked urine? N  Do you have problems with loss of bowel control? N  Managing your Medications? N  Managing your Finances? N  Housekeeping or managing your Housekeeping? N    Patient Care Team: Blane Ohara, MD as PCP - General (Family Medicine) Albertina Parr (Nurse Practitioner)     Assessment:   This is a routine wellness examination for [place].  Hearing/Vision screen No results found.  Dietary issues and exercise activities discussed: Current Exercise Habits:  The patient does not participate in regular exercise at present, Exercise limited by: None identified  Depression Screen    02/20/2022   11:02 AM 02/06/2022    2:19 PM 08/18/2021    8:35 AM 07/19/2021    9:36 AM 10/22/2020    1:54 PM 08/22/2020   10:24 AM  PHQ 2/9 Scores  PHQ - 2 Score 0 0 0 0 6 6  PHQ- 9 Score  3  6 18 22     Fall Risk    08/15/2022    8:58 AM 02/20/2022   11:02 AM 02/06/2022    2:19 PM 08/18/2021    8:41 AM 07/19/2021    9:36 AM  Fall Risk   Falls in the past year? 1 1 1  0 0  Number falls in past yr: 1 1 1  0 0  Injury with Fall? 0 0 0 0 0  Risk for fall due to : History of fall(s)   Impaired mobility No Fall Risks  Follow up Falls evaluation completed;Education provided;Falls prevention discussed   Education provided Falls evaluation completed    FALL RISK PREVENTION PERTAINING TO THE HOME:  Any stairs in or around the home? Yes  If so, are there any without handrails? No  Home free of loose throw rugs in walkways, pet beds, electrical cords, etc? Yes  Adequate lighting in your home to reduce risk of falls? Yes   ASSISTIVE DEVICES UTILIZED TO PREVENT FALLS:  Life alert? No  Use of a cane, walker or w/c? No  Grab bars in the bathroom? No  Shower chair or bench in shower? No  Elevated toilet seat or a handicapped toilet? No   Gait steady and fast without use of assistive device  Cognitive Function:         08/18/2021    8:47 AM  6CIT Screen  What Year? 0 points  What month? 0 points  What time? 0 points  Count back from 20 0 points  Months in reverse 0 points  Repeat phrase 0 points  Total Score 0 points    Immunizations Immunization History  Administered Date(s) Administered   Influenza Inj Mdck Quad Pf 08/22/2020, 08/16/2021   Influenza-Unspecified 08/22/2019   Moderna Covid-19 Vaccine Bivalent Booster 52yrs & up 11/22/2021   PNEUMOCOCCAL CONJUGATE-20 11/22/2021    TDAP status: Due, Education has been provided regarding the importance of this vaccine. Advised may receive this vaccine at local pharmacy or Health Dept. Aware to provide a copy of the vaccination record if obtained from local pharmacy or Health Dept. Verbalized acceptance and understanding.  Flu Vaccine status: Completed at today's visit  Qualifies for Shingles Vaccine? Yes   Zostavax completed No   Shingrix Completed?: No.    Education has been provided regarding the importance of this vaccine. Patient has been advised to call insurance company to determine out of pocket expense if they have not yet received this vaccine. Advised may also receive vaccine at local pharmacy or Health Dept. Verbalized acceptance and understanding.  Screening Tests Health Maintenance  Topic Date Due   PAP SMEAR-Modifier  Never done   Zoster Vaccines- Shingrix (1 of 2) Never done   INFLUENZA VACCINE  07/08/2022   TETANUS/TDAP  02/07/2023 (Originally [birth year]/1982)   MAMMOGRAM  08/28/2023 (Originally 01/26/2020)   COLONOSCOPY (Pts 45-29yrs Insurance coverage will need to be confirmed)  01/23/2032   COVID-19 Vaccine  Completed   Hepatitis C Screening  Completed   HIV Screening  Completed   HPV VACCINES  Aged  Out    Health Maintenance  Health Maintenance Due  Topic Date Due   PAP SMEAR-Modifier  Never done   Zoster Vaccines- Shingrix (1 of 2) Never done   INFLUENZA VACCINE  07/08/2022    Colorectal cancer screening: Type of  screening: Colonoscopy. Completed 01/22/22.   Mammogram status: Completed 01/2019. Repeat every year - Patient declined   Additional Screening:  Vision Screening: Recommended annual ophthalmology exams for early detection of glaucoma and other disorders of the eye. Is the patient up to date with their annual eye exam?  Yes  Who is the provider or what is the name of the office in which the patient attends annual eye exams? [facility]  Dental Screening: Recommended annual dental exams for proper oral hygiene  Community Resource Referral / Chronic Care Management: CRR required this visit?  No   CCM required this visit?  No      Plan:     I have personally reviewed and noted the following in the patient's chart:   Medical and social history Use of alcohol, tobacco or illicit drugs  Current medications and supplements including opioid prescriptions.  Functional ability and status Nutritional status Physical activity Advanced directives List of other physicians Hospitalizations, surgeries, and ER visits in previous 12 months Vitals Screenings to include cognitive, depression, and falls Referrals and appointments  In addition, I have reviewed and discussed with patient certain preventive protocols, quality metrics, and best practice recommendations. A written personalized care plan for preventive services as well as general preventive health recommendations were provided to patient.     Jacklynn BueKimberly M Kerryann Allaire, LPN   1/61/09609/20/2023

## 2022-08-27 NOTE — Patient Instructions (Signed)

## 2022-08-27 NOTE — Assessment & Plan Note (Signed)
Patient needs to return for a nurse check for bp.

## 2022-09-01 ENCOUNTER — Telehealth: Payer: Self-pay

## 2022-09-01 NOTE — Telephone Encounter (Signed)
Called patient and made her aware that provider would like for her to check BP at home and keep a log and call us with the BP reading.

## 2022-09-08 ENCOUNTER — Telehealth: Payer: Self-pay

## 2022-09-08 DIAGNOSIS — M545 Low back pain, unspecified: Secondary | ICD-10-CM

## 2022-09-08 DIAGNOSIS — M5410 Radiculopathy, site unspecified: Secondary | ICD-10-CM

## 2022-09-08 MED ORDER — PREDNISONE 50 MG PO TABS: 50.0000 mg | ORAL_TABLET | Freq: Every day | ORAL | 0 refills | Status: DC

## 2022-09-08 NOTE — Telephone Encounter (Signed)
Contacted patient to reply with message and patient sounded off and was making comment's stating that she was wanting to go in peace and Dr. Tobie Poet at that point took hold and got on the phone with patient and talked to patient about her symptoms and questioned the patient if she was suicidal and patient states that she is not and understood if she were to develop symptoms or thoughts of hurting her self that she would go to the nearest ER for treatment and help. Patient was instructed to do Prednisone 50 mg once daily for 5 days, then patient will go get xrays of throacic and cervical spine tomorrow at [facility].

## 2022-09-08 NOTE — Telephone Encounter (Signed)
Patient called about her left shoulder pain and arm numbness, Patient denies chest pain. Muscle relaxer is not helping at all. Patient also stated that her blood pressure was 160/84 Pulse of 86 this morning, and yesterday was 145/77 with heart rate of 76.   Recommended patient to go get check out at hospital or urgent care due to her symptoms, patient denied, stated that she think the pain is just from her pinch nerve in her back. Please advise.

## 2022-09-09 ENCOUNTER — Other Ambulatory Visit: Payer: Self-pay | Admitting: Family Medicine

## 2022-09-09 DIAGNOSIS — G8929 Other chronic pain: Secondary | ICD-10-CM

## 2022-09-09 DIAGNOSIS — M542 Cervicalgia: Secondary | ICD-10-CM | POA: Diagnosis not present

## 2022-09-09 DIAGNOSIS — M546 Pain in thoracic spine: Secondary | ICD-10-CM | POA: Diagnosis not present

## 2022-09-09 DIAGNOSIS — M5412 Radiculopathy, cervical region: Secondary | ICD-10-CM

## 2022-09-17 ENCOUNTER — Telehealth: Payer: Self-pay

## 2022-09-17 ENCOUNTER — Other Ambulatory Visit: Payer: Self-pay | Admitting: Family Medicine

## 2022-09-17 ENCOUNTER — Other Ambulatory Visit: Payer: Self-pay

## 2022-09-17 DIAGNOSIS — M545 Low back pain, unspecified: Secondary | ICD-10-CM

## 2022-09-17 DIAGNOSIS — M5410 Radiculopathy, site unspecified: Secondary | ICD-10-CM

## 2022-09-17 DIAGNOSIS — I10 Essential (primary) hypertension: Secondary | ICD-10-CM

## 2022-09-17 DIAGNOSIS — G8929 Other chronic pain: Secondary | ICD-10-CM

## 2022-09-17 DIAGNOSIS — M542 Cervicalgia: Secondary | ICD-10-CM

## 2022-09-17 MED ORDER — PREDNISONE 50 MG PO TABS: 50.0000 mg | ORAL_TABLET | Freq: Every day | ORAL | 0 refills | Status: DC

## 2022-09-17 MED ORDER — LOSARTAN POTASSIUM 50 MG PO TABS: 50.0000 mg | ORAL_TABLET | Freq: Every day | ORAL | 0 refills | Status: DC

## 2022-09-17 NOTE — Telephone Encounter (Signed)
Results given.  Second course of prednisone given.  Refer to therapy. Dr. Tobie Poet

## 2022-09-17 NOTE — Telephone Encounter (Signed)
Cynthia [name] called to report that she completed the 5 days of prednisone with very little improvement.  She is questioning the results of her xrays

## 2022-09-24 DIAGNOSIS — R69 Illness, unspecified: Secondary | ICD-10-CM | POA: Diagnosis not present

## 2022-09-24 DIAGNOSIS — F3132 Bipolar disorder, current episode depressed, moderate: Secondary | ICD-10-CM | POA: Diagnosis not present

## 2022-10-05 ENCOUNTER — Telehealth: Payer: Self-pay

## 2022-10-05 NOTE — Telephone Encounter (Signed)
I left a message on the number(s) listed in the patients chart requesting the patient to call back regarding the upcomming appointment for 10/06/2022. The provider is out of the office that day. The appointment has been canceled. Waiting for the patient to return the call.   

## 2022-10-06 ENCOUNTER — Ambulatory Visit: Payer: Medicare HMO | Admitting: Family Medicine

## 2022-10-16 DIAGNOSIS — R69 Illness, unspecified: Secondary | ICD-10-CM | POA: Diagnosis not present

## 2022-10-16 DIAGNOSIS — F3132 Bipolar disorder, current episode depressed, moderate: Secondary | ICD-10-CM | POA: Diagnosis not present

## 2022-10-24 ENCOUNTER — Other Ambulatory Visit: Payer: Self-pay | Admitting: Family Medicine

## 2022-10-24 ENCOUNTER — Telehealth: Payer: Self-pay

## 2022-10-24 DIAGNOSIS — M5410 Radiculopathy, site unspecified: Secondary | ICD-10-CM

## 2022-10-24 DIAGNOSIS — M545 Low back pain, unspecified: Secondary | ICD-10-CM

## 2022-10-24 MED ORDER — PREDNISONE 50 MG PO TABS: 50.0000 mg | ORAL_TABLET | Freq: Every day | ORAL | 0 refills | Status: DC

## 2022-10-24 NOTE — Telephone Encounter (Signed)
Cynthia [name] called requesting a refill of her prednisone for her neck and back.  Dr. Sedalia Muta approved the refill but she needs to call us back in 1 week.  If symptoms have not improved with proceed with MRI.

## 2022-11-03 ENCOUNTER — Telehealth: Payer: Self-pay

## 2022-11-03 NOTE — Telephone Encounter (Signed)
[facility] called to report that Cynthia [name] was there for therapy on her shoulder but she is complaining of neck and back pain.  She is having difficulty walking and she fell in their waiting room.  We have no available appointments this morning.  Everything was reviewed with Dr. Sedalia Muta and she advised that she go to the Urgent Care for evaluation and treatment.

## 2022-11-04 ENCOUNTER — Ambulatory Visit (INDEPENDENT_AMBULATORY_CARE_PROVIDER_SITE_OTHER): Payer: Medicare HMO | Admitting: Family Medicine

## 2022-11-04 VITALS — BP 158/98 | HR 105 | Temp 97.2°F | Resp 18 | Ht 63.0 in | Wt 253.0 lb

## 2022-11-04 DIAGNOSIS — R202 Paresthesia of skin: Secondary | ICD-10-CM | POA: Diagnosis not present

## 2022-11-04 DIAGNOSIS — M546 Pain in thoracic spine: Secondary | ICD-10-CM

## 2022-11-04 DIAGNOSIS — R0789 Other chest pain: Secondary | ICD-10-CM

## 2022-11-04 DIAGNOSIS — M5412 Radiculopathy, cervical region: Secondary | ICD-10-CM | POA: Diagnosis not present

## 2022-11-04 DIAGNOSIS — R29898 Other symptoms and signs involving the musculoskeletal system: Secondary | ICD-10-CM

## 2022-11-04 DIAGNOSIS — R270 Ataxia, unspecified: Secondary | ICD-10-CM | POA: Diagnosis not present

## 2022-11-04 DIAGNOSIS — M545 Low back pain, unspecified: Secondary | ICD-10-CM | POA: Diagnosis not present

## 2022-11-04 DIAGNOSIS — G8929 Other chronic pain: Secondary | ICD-10-CM | POA: Diagnosis not present

## 2022-11-04 NOTE — Progress Notes (Unsigned)
Subjective:  Patient ID: Cynthia [name], female    DOB: 07-02-1961  Age: 61 y.o. MRN: [id number]  Chief Complaint  Patient presents with   Back Pain   HPI:  Back Pain Associated symptoms include numbness and weakness. Pertinent negatives include no abdominal pain, chest pain, dysuria or headaches.   Patient states that her back pain is constant and left arm is asleep most of the time and she states that at night her back pain is so bad, she feels like her chest will explode. Has been going on 2 months. Patient saw chiropractor twice, but now left leg feels funny. Her foot feels like it is going to go out. She went to physical therapy yesterday for her first visit and tripped in their lobby and fell. Her pain was going on prior to the fall. Patient has had 3 courses of prednisone over the last 2 months. It helps while she is on the medicine, but returns when completes course. Her balance is very poor. She is using a walker.  Cervical xr and thoracic xrays done at [facility] showed no significant abnormalities. Comment about cspine xray included lack of oblique films may have limited view of foraminal narrowing..   Tried ibuprofen and tylenol. Neither helped much.   Current Outpatient Medications on File Prior to Visit  Medication Sig Dispense Refill   albuterol (VENTOLIN HFA) 108 (90 Base) MCG/ACT inhaler Inhale 1-2 puffs into the lungs every 4 (four) hours as needed for wheezing or shortness of breath. 1 each 3   Budeson-Glycopyrrol-Formoterol (BREZTRI AEROSPHERE) 160-9-4.8 MCG/ACT AERO Inhale 2 puffs into the lungs 2 (two) times daily as needed. 10.7 g 3   buPROPion (WELLBUTRIN XL) 150 MG 24 hr tablet Take 1 tablet (150 mg total) by mouth daily. For depression 30 tablet 0   cyclobenzaprine (FLEXERIL) 5 MG tablet Take 1 tablet (5 mg total) by mouth 3 (three) times daily as needed for muscle spasms. 30 tablet 1   divalproex (DEPAKOTE) 500 MG DR tablet Take 1 tablet (500 mg total) by  mouth every 12 (twelve) hours. For mood stabilization (Patient taking differently: Take 500 mg by mouth daily. For mood stabilization) 60 tablet 0   ferrous gluconate (FERGON) 324 MG tablet Take 324 mg by mouth daily.     ibuprofen (ADVIL) 800 MG tablet Take 1 tablet (800 mg total) by mouth every 8 (eight) hours as needed. 270 tablet 0   losartan (COZAAR) 50 MG tablet Take 1 tablet (50 mg total) by mouth daily. 90 tablet 0   Magnesium 250 MG TABS Take by mouth.     meclizine (ANTIVERT) 25 MG tablet Take 1 tablet (25 mg total) by mouth 3 (three) times daily as needed for dizziness. 40 tablet 0   rosuvastatin (CRESTOR) 40 MG tablet Take 1 tablet (40 mg total) by mouth daily. 90 tablet 1   No current facility-administered medications on file prior to visit.   Past Medical History:  Diagnosis Date   Bipolar 1 disorder ([place])    Essential hypertension    GERD (gastroesophageal reflux disease)    Past Surgical History:  Procedure Laterality Date   NO PAST SURGERIES      Family History  Problem Relation Age of Onset   Cancer Mother        lung   Cancer Father        prostate   Diabetes Paternal Grandfather    Social History   Socioeconomic History   Marital status: Divorced  Spouse name: Not on file   Number of children: 3   Years of education: Not on file   Highest education level: Not on file  Occupational History   Not on file  Tobacco Use   Smoking status: Never   Smokeless tobacco: Never  Vaping Use   Vaping Use: Never used  Substance and Sexual Activity   Alcohol use: Not Currently    Comment: rarely   Drug use: Never   Sexual activity: Yes    Partners: Male  Other Topics Concern   Not on file  Social History Narrative   Not on file   Social Determinants of Health   Financial Resource Strain: Low Risk  (08/18/2021)   Overall Financial Resource Strain (CARDIA)    Difficulty of Paying Living Expenses: Not hard at all  Food Insecurity: No Food Insecurity  (08/18/2021)   Hunger Vital Sign    Worried About Running Out of Food in the Last Year: Never true    Ran Out of Food in the Last Year: Never true  Transportation Needs: No Transportation Needs (08/18/2021)   PRAPARE - Administrator, Civil Service (Medical): No    Lack of Transportation (Non-Medical): No  Physical Activity: Inactive (08/18/2021)   Exercise Vital Sign    Days of Exercise per Week: 0 days    Minutes of Exercise per Session: 0 min  Stress: No Stress Concern Present (08/18/2021)   [employer] of Occupational Health - Occupational Stress Questionnaire    Feeling of Stress : Not at all  Social Connections: Socially Isolated (08/18/2021)   Social Connection and Isolation Panel [NHANES]    Frequency of Communication with Friends and Family: More than three times a week    Frequency of Social Gatherings with Friends and Family: More than three times a week    Attends Religious Services: Never    Database administrator or Organizations: No    Attends Banker Meetings: Never    Marital Status: Divorced    Review of Systems  Constitutional:  Negative for chills and fatigue.  HENT:  Negative for congestion, ear pain, sinus pain and sore throat.   Respiratory:  Negative for cough and shortness of breath.   Cardiovascular:  Negative for chest pain and leg swelling.  Gastrointestinal:  Negative for abdominal pain, constipation, diarrhea, nausea and vomiting.  Genitourinary:  Negative for dysuria and frequency.  Musculoskeletal:  Positive for back pain and neck pain. Negative for arthralgias and myalgias.  Neurological:  Positive for dizziness, weakness and numbness. Negative for headaches.     Objective:  BP (!) 158/98 (BP Location: Left Arm, Patient Position: Sitting)   Pulse (!) 105   Temp (!) 97.2 F (36.2 C)   Resp 18   Ht 5\' 3"  (1.6 m)   Wt 253 lb (114.8 kg)   LMP  (LMP Unknown)   SpO2 99%   BMI 44.82 kg/m      11/04/2022   11:36 AM  11/04/2022   10:29 AM 08/25/2022    8:32 AM  BP/Weight  Systolic BP 158 140 172  Diastolic BP 98 90 102  Wt. (Lbs)  253   BMI  44.82 kg/m2     Physical Exam Vitals reviewed.  Constitutional:      Appearance: Normal appearance. She is obese.  Neck:     Vascular: No carotid bruit.  Cardiovascular:     Rate and Rhythm: Normal rate and regular rhythm.  Heart sounds: Normal heart sounds.  Pulmonary:     Effort: Pulmonary effort is normal. No respiratory distress.     Breath sounds: Normal breath sounds.  Abdominal:     Palpations: Abdomen is soft.     Tenderness: There is no abdominal tenderness.  Musculoskeletal:        General: Tenderness (inferior to left scapula. Nontender over chest.) present.  Neurological:     Mental Status: She is alert and oriented to person, place, and time.     Cranial Nerves: No cranial nerve deficit.     Motor: Weakness (LE. UE normal) present.     Coordination: Coordination abnormal (positive rhombergs. negative dysmetria.).     Gait: Gait abnormal (Wide based gait. Out toeing BL).     Deep Tendon Reflexes: Reflexes normal.  Psychiatric:        Mood and Affect: Mood normal.        Behavior: Behavior normal.     Diabetic Foot Exam - Simple   No data filed      Lab Results  Component Value Date   WBC 6.7 08/25/2022   HGB 12.2 08/25/2022   HCT 38.7 08/25/2022   PLT 321 08/25/2022   GLUCOSE 113 (H) 08/25/2022   CHOL 152 08/25/2022   TRIG 134 08/25/2022   HDL 55 08/25/2022   LDLCALC 74 08/25/2022   ALT 12 08/25/2022   AST 14 08/25/2022   NA 146 (H) 08/25/2022   K 5.1 08/25/2022   CL 104 08/25/2022   CREATININE 0.77 08/25/2022   BUN 10 08/25/2022   CO2 27 08/25/2022   TSH 0.963 08/25/2022   INR 1.0 07/03/2019   HGBA1C 5.9 (H) 08/25/2022      Assessment & Plan:   Problem List Items Addressed This Visit       Nervous and Auditory   Cervical radiculopathy    Referral to neurosurgery - Dr. Yetta Barre.  Likely will need mris  of her c spine and perhaps her brain. Somewhat concerning for cervical stenosis although she is not hyperreflexic. Certainly is ataxic.        Musculoskeletal and Integument   Proximal leg weakness    Check labs. Rule out PMR.       Relevant Orders   Sedimentation rate   C-reactive protein   TSH     Other   Lumbar back pain - Primary    No xrays done. More of problem is in her legs than pain in lumbar back.      Relevant Orders   Comprehensive metabolic panel   CBC with Differential/Platelet   Chronic left-sided thoracic back pain    Likely musculoskeletal.      Ataxia    Referral to neurosurgery - Dr. Yetta Barre.  Labs ordered.  Likely will need mris of her spine and perhaps her brain.      Relevant Orders   B12 and Folate Panel   Methylmalonic acid, serum   Other chest pain    Am most concerning for musculoskeletal. Cardiac and pulmonary exams normal.      Relevant Orders   DG Chest 2 View   Paresthesia    Check labs     .  No orders of the defined types were placed in this encounter.   Orders Placed This Encounter  Procedures   DG Chest 2 View   Sedimentation rate   C-reactive protein   B12 and Folate Panel   Methylmalonic acid, serum   TSH   Comprehensive metabolic  panel   CBC with Differential/Platelet    Total time spent on today's visit was greater than 30 minutes, including both face-to-face time and nonface-to-face time personally spent on review of chart (labs and imaging), discussing labs and goals, discussing further work-up, treatment options, referrals to specialist if needed, reviewing outside records of pertinent, answering patient's questions, and coordinating care.  Follow-up: Return in about 2 months (around 01/04/2023) for chronic follow up.  An After Visit Summary was printed and given to the patient.  Rochel Brome, MD [facility] [phone]

## 2022-11-05 ENCOUNTER — Encounter: Payer: Self-pay | Admitting: Family Medicine

## 2022-11-05 DIAGNOSIS — R29898 Other symptoms and signs involving the musculoskeletal system: Secondary | ICD-10-CM | POA: Insufficient documentation

## 2022-11-05 DIAGNOSIS — R0789 Other chest pain: Secondary | ICD-10-CM | POA: Insufficient documentation

## 2022-11-05 DIAGNOSIS — R270 Ataxia, unspecified: Secondary | ICD-10-CM | POA: Insufficient documentation

## 2022-11-05 DIAGNOSIS — M5412 Radiculopathy, cervical region: Secondary | ICD-10-CM | POA: Insufficient documentation

## 2022-11-05 DIAGNOSIS — R202 Paresthesia of skin: Secondary | ICD-10-CM | POA: Insufficient documentation

## 2022-11-05 NOTE — Assessment & Plan Note (Signed)
Check labs.  °Rule out PMR.  °

## 2022-11-05 NOTE — Assessment & Plan Note (Signed)
Check labs 

## 2022-11-05 NOTE — Assessment & Plan Note (Signed)
Likely musculoskeletal.

## 2022-11-05 NOTE — Assessment & Plan Note (Addendum)
Referral to neurosurgery - Dr. Yetta Barre.  Likely will need mris of her c spine and perhaps her brain. Somewhat concerning for cervical stenosis although she is not hyperreflexic. Certainly is ataxic.

## 2022-11-05 NOTE — Assessment & Plan Note (Signed)
Referral to neurosurgery - Dr. Yetta Barre.  Labs ordered.  Likely will need mris of her spine and perhaps her brain.

## 2022-11-05 NOTE — Assessment & Plan Note (Signed)
Am most concerning for musculoskeletal. Cardiac and pulmonary exams normal.

## 2022-11-05 NOTE — Assessment & Plan Note (Signed)
No xrays done. More of problem is in her legs than pain in lumbar back.

## 2022-11-06 ENCOUNTER — Other Ambulatory Visit: Payer: Self-pay

## 2022-11-06 ENCOUNTER — Other Ambulatory Visit: Payer: Self-pay | Admitting: Family Medicine

## 2022-11-06 DIAGNOSIS — Z6841 Body Mass Index (BMI) 40.0 and over, adult: Secondary | ICD-10-CM | POA: Diagnosis not present

## 2022-11-06 DIAGNOSIS — D649 Anemia, unspecified: Secondary | ICD-10-CM

## 2022-11-06 DIAGNOSIS — M5412 Radiculopathy, cervical region: Secondary | ICD-10-CM | POA: Diagnosis not present

## 2022-11-06 DIAGNOSIS — N289 Disorder of kidney and ureter, unspecified: Secondary | ICD-10-CM

## 2022-11-06 LAB — CBC WITH DIFFERENTIAL/PLATELET
Basophils Absolute: 0 10*3/uL (ref 0.0–0.2)
Basos: 0 %
EOS (ABSOLUTE): 0.2 10*3/uL (ref 0.0–0.4)
Eos: 2 %
Hematocrit: 34.5 % (ref 34.0–46.6)
Hemoglobin: 10.6 g/dL — ABNORMAL LOW (ref 11.1–15.9)
Immature Grans (Abs): 0 10*3/uL (ref 0.0–0.1)
Immature Granulocytes: 0 %
Lymphocytes Absolute: 2.4 10*3/uL (ref 0.7–3.1)
Lymphs: 23 %
MCH: 26.7 pg (ref 26.6–33.0)
MCHC: 30.7 g/dL — ABNORMAL LOW (ref 31.5–35.7)
MCV: 87 fL (ref 79–97)
Monocytes Absolute: 0.6 10*3/uL (ref 0.1–0.9)
Monocytes: 6 %
Neutrophils Absolute: 7.1 10*3/uL — ABNORMAL HIGH (ref 1.4–7.0)
Neutrophils: 69 %
Platelets: 415 10*3/uL (ref 150–450)
RBC: 3.97 x10E6/uL (ref 3.77–5.28)
RDW: 13.8 % (ref 11.7–15.4)
WBC: 10.4 10*3/uL (ref 3.4–10.8)

## 2022-11-06 LAB — B12 AND FOLATE PANEL
Folate: 7.6 ng/mL (ref >3.0)
Vitamin B-12: 490 pg/mL (ref 232–1245)

## 2022-11-06 LAB — COMPREHENSIVE METABOLIC PANEL
ALT: 12 IU/L (ref 0–32)
AST: 15 IU/L (ref 0–40)
Albumin/Globulin Ratio: 1.4 (ref 1.2–2.2)
Albumin: 4 g/dL (ref 3.8–4.9)
Alkaline Phosphatase: 76 IU/L (ref 44–121)
BUN/Creatinine Ratio: 11 (ref 9–23)
BUN: 13 mg/dL (ref 6–24)
Bilirubin Total: 0.5 mg/dL (ref 0.0–1.2)
CO2: 25 mmol/L (ref 20–29)
Calcium: 9.7 mg/dL (ref 8.7–10.2)
Chloride: 99 mmol/L (ref 96–106)
Creatinine, Ser: 1.19 mg/dL — ABNORMAL HIGH (ref 0.57–1.00)
Globulin, Total: 2.9 g/dL (ref 1.5–4.5)
Glucose: 128 mg/dL — ABNORMAL HIGH (ref 70–99)
Potassium: 4 mmol/L (ref 3.5–5.2)
Sodium: 140 mmol/L (ref 134–144)
Total Protein: 6.9 g/dL (ref 6.0–8.5)
eGFR: 53 mL/min/{1.73_m2} — ABNORMAL LOW (ref >59)

## 2022-11-06 LAB — METHYLMALONIC ACID, SERUM: Methylmalonic Acid: 123 nmol/L (ref 0–378)

## 2022-11-06 LAB — TSH: TSH: 0.745 u[IU]/mL (ref 0.450–4.500)

## 2022-11-06 LAB — SEDIMENTATION RATE: Sed Rate: 15 mm/hr (ref 0–40)

## 2022-11-06 LAB — C-REACTIVE PROTEIN: CRP: 3 mg/L (ref 0–10)

## 2022-11-06 MED ORDER — TRAMADOL HCL 50 MG PO TABS: 50.0000 mg | ORAL_TABLET | Freq: Four times a day (QID) | ORAL | 0 refills | Status: AC | PRN

## 2022-11-06 NOTE — Progress Notes (Signed)
Called patient and sent tramadol.  Cynthia [name] called. Dr. Sedalia Muta

## 2022-11-07 DIAGNOSIS — Z008 Encounter for other general examination: Secondary | ICD-10-CM | POA: Diagnosis not present

## 2022-11-07 DIAGNOSIS — R69 Illness, unspecified: Secondary | ICD-10-CM | POA: Diagnosis not present

## 2022-11-07 DIAGNOSIS — E785 Hyperlipidemia, unspecified: Secondary | ICD-10-CM | POA: Diagnosis not present

## 2022-11-07 DIAGNOSIS — N182 Chronic kidney disease, stage 2 (mild): Secondary | ICD-10-CM | POA: Diagnosis not present

## 2022-11-07 DIAGNOSIS — Z809 Family history of malignant neoplasm, unspecified: Secondary | ICD-10-CM | POA: Diagnosis not present

## 2022-11-07 DIAGNOSIS — Z9181 History of falling: Secondary | ICD-10-CM | POA: Diagnosis not present

## 2022-11-07 DIAGNOSIS — I129 Hypertensive chronic kidney disease with stage 1 through stage 4 chronic kidney disease, or unspecified chronic kidney disease: Secondary | ICD-10-CM | POA: Diagnosis not present

## 2022-11-07 DIAGNOSIS — R32 Unspecified urinary incontinence: Secondary | ICD-10-CM | POA: Diagnosis not present

## 2022-11-07 DIAGNOSIS — Z6841 Body Mass Index (BMI) 40.0 and over, adult: Secondary | ICD-10-CM | POA: Diagnosis not present

## 2022-11-07 DIAGNOSIS — G4730 Sleep apnea, unspecified: Secondary | ICD-10-CM | POA: Diagnosis not present

## 2022-11-08 DIAGNOSIS — Z79891 Long term (current) use of opiate analgesic: Secondary | ICD-10-CM | POA: Diagnosis not present

## 2022-11-08 DIAGNOSIS — M546 Pain in thoracic spine: Secondary | ICD-10-CM | POA: Diagnosis not present

## 2022-11-09 ENCOUNTER — Encounter: Payer: Self-pay | Admitting: Family Medicine

## 2022-11-11 DIAGNOSIS — M4802 Spinal stenosis, cervical region: Secondary | ICD-10-CM | POA: Diagnosis not present

## 2022-11-11 DIAGNOSIS — M549 Dorsalgia, unspecified: Secondary | ICD-10-CM | POA: Diagnosis not present

## 2022-11-11 DIAGNOSIS — M5412 Radiculopathy, cervical region: Secondary | ICD-10-CM | POA: Diagnosis not present

## 2022-11-11 DIAGNOSIS — R531 Weakness: Secondary | ICD-10-CM | POA: Diagnosis not present

## 2022-11-11 DIAGNOSIS — G8929 Other chronic pain: Secondary | ICD-10-CM | POA: Diagnosis not present

## 2022-11-11 DIAGNOSIS — R200 Anesthesia of skin: Secondary | ICD-10-CM | POA: Diagnosis not present

## 2022-11-11 DIAGNOSIS — M542 Cervicalgia: Secondary | ICD-10-CM | POA: Diagnosis not present

## 2022-11-12 ENCOUNTER — Other Ambulatory Visit: Payer: Self-pay | Admitting: Student

## 2022-11-12 DIAGNOSIS — M5412 Radiculopathy, cervical region: Secondary | ICD-10-CM

## 2022-11-20 ENCOUNTER — Other Ambulatory Visit: Payer: Medicare HMO

## 2022-11-20 DIAGNOSIS — N289 Disorder of kidney and ureter, unspecified: Secondary | ICD-10-CM

## 2022-11-20 DIAGNOSIS — D649 Anemia, unspecified: Secondary | ICD-10-CM | POA: Diagnosis not present

## 2022-11-20 LAB — CBC WITH DIFFERENTIAL/PLATELET
Basophils Absolute: 0.1 10*3/uL (ref 0.0–0.2)
Basos: 1 %
EOS (ABSOLUTE): 0.2 10*3/uL (ref 0.0–0.4)
Eos: 3 %
Hematocrit: 32.9 % — ABNORMAL LOW (ref 34.0–46.6)
Hemoglobin: 10.6 g/dL — ABNORMAL LOW (ref 11.1–15.9)
Immature Grans (Abs): 0 10*3/uL (ref 0.0–0.1)
Immature Granulocytes: 0 %
Lymphocytes Absolute: 2.1 10*3/uL (ref 0.7–3.1)
Lymphs: 29 %
MCH: 28.1 pg (ref 26.6–33.0)
MCHC: 32.2 g/dL (ref 31.5–35.7)
MCV: 87 fL (ref 79–97)
Monocytes Absolute: 0.6 10*3/uL (ref 0.1–0.9)
Monocytes: 8 %
Neutrophils Absolute: 4.2 10*3/uL (ref 1.4–7.0)
Neutrophils: 59 %
Platelets: 419 10*3/uL (ref 150–450)
RBC: 3.77 x10E6/uL (ref 3.77–5.28)
RDW: 14.3 % (ref 11.7–15.4)
WBC: 7.1 10*3/uL (ref 3.4–10.8)

## 2022-11-21 LAB — COMPREHENSIVE METABOLIC PANEL
ALT: 12 IU/L (ref 0–32)
AST: 15 IU/L (ref 0–40)
Albumin/Globulin Ratio: 1.9 (ref 1.2–2.2)
Albumin: 4.3 g/dL (ref 3.8–4.9)
Alkaline Phosphatase: 81 IU/L (ref 44–121)
BUN/Creatinine Ratio: 15 (ref 9–23)
BUN: 12 mg/dL (ref 6–24)
Bilirubin Total: 0.3 mg/dL (ref 0.0–1.2)
CO2: 25 mmol/L (ref 20–29)
Calcium: 9.3 mg/dL (ref 8.7–10.2)
Chloride: 101 mmol/L (ref 96–106)
Creatinine, Ser: 0.78 mg/dL (ref 0.57–1.00)
Globulin, Total: 2.3 g/dL (ref 1.5–4.5)
Glucose: 107 mg/dL — ABNORMAL HIGH (ref 70–99)
Potassium: 4.4 mmol/L (ref 3.5–5.2)
Sodium: 141 mmol/L (ref 134–144)
Total Protein: 6.6 g/dL (ref 6.0–8.5)
eGFR: 87 mL/min/{1.73_m2} (ref >59)

## 2022-11-23 NOTE — Progress Notes (Signed)
Blood count abnormal. Hb a little low. Recommend add iron studies. Add b12 and folate.  Liver function normal.  Kidney function normal.

## 2022-11-25 ENCOUNTER — Ambulatory Visit
Admission: RE | Admit: 2022-11-25 | Discharge: 2022-11-25 | Disposition: A | Discharge status: Home / Self Care | Payer: Medicare HMO | Source: Ambulatory Visit | Attending: Student | Admitting: Student

## 2022-11-25 DIAGNOSIS — M5412 Radiculopathy, cervical region: Secondary | ICD-10-CM

## 2022-11-25 DIAGNOSIS — M4722 Other spondylosis with radiculopathy, cervical region: Secondary | ICD-10-CM | POA: Diagnosis not present

## 2022-11-25 DIAGNOSIS — M4802 Spinal stenosis, cervical region: Secondary | ICD-10-CM | POA: Diagnosis not present

## 2022-11-26 ENCOUNTER — Other Ambulatory Visit: Payer: Self-pay | Admitting: Family Medicine

## 2022-11-26 MED ORDER — HYDROCODONE-ACETAMINOPHEN 5-325 MG PO TABS: 1.0000 | ORAL_TABLET | ORAL | 0 refills | Status: DC | PRN

## 2022-11-27 DIAGNOSIS — M5412 Radiculopathy, cervical region: Secondary | ICD-10-CM | POA: Diagnosis not present

## 2022-11-28 ENCOUNTER — Other Ambulatory Visit: Payer: Self-pay | Admitting: Neurological Surgery

## 2022-11-28 DIAGNOSIS — M4804 Spinal stenosis, thoracic region: Secondary | ICD-10-CM | POA: Diagnosis not present

## 2022-11-28 DIAGNOSIS — M4714 Other spondylosis with myelopathy, thoracic region: Secondary | ICD-10-CM | POA: Diagnosis not present

## 2022-12-01 ENCOUNTER — Emergency Department (HOSPITAL_COMMUNITY): Payer: Medicare HMO | Admitting: Anesthesiology

## 2022-12-01 ENCOUNTER — Other Ambulatory Visit: Payer: Self-pay

## 2022-12-01 ENCOUNTER — Encounter (HOSPITAL_COMMUNITY): Payer: Self-pay | Admitting: Certified Registered Nurse Anesthetist

## 2022-12-01 ENCOUNTER — Inpatient Hospital Stay (HOSPITAL_COMMUNITY)
Admission: EM | Disposition: A | Discharge status: Home / Self Care | Payer: Self-pay | Source: Home / Self Care | Attending: Neurosurgery

## 2022-12-01 ENCOUNTER — Emergency Department (HOSPITAL_COMMUNITY): Payer: Medicare HMO

## 2022-12-01 ENCOUNTER — Inpatient Hospital Stay (HOSPITAL_COMMUNITY)
Admission: EM | Admit: 2022-12-01 | Discharge: 2022-12-03 | DRG: 519 | Disposition: A | Discharge status: Home / Self Care | Payer: Medicare HMO | Attending: Neurosurgery | Admitting: Neurosurgery

## 2022-12-01 DIAGNOSIS — K219 Gastro-esophageal reflux disease without esophagitis: Secondary | ICD-10-CM | POA: Diagnosis not present

## 2022-12-01 DIAGNOSIS — G9520 Unspecified cord compression: Secondary | ICD-10-CM | POA: Diagnosis not present

## 2022-12-01 DIAGNOSIS — Z79899 Other long term (current) drug therapy: Secondary | ICD-10-CM

## 2022-12-01 DIAGNOSIS — R531 Weakness: Secondary | ICD-10-CM | POA: Diagnosis not present

## 2022-12-01 DIAGNOSIS — D649 Anemia, unspecified: Secondary | ICD-10-CM | POA: Diagnosis not present

## 2022-12-01 DIAGNOSIS — Z6841 Body Mass Index (BMI) 40.0 and over, adult: Secondary | ICD-10-CM | POA: Diagnosis not present

## 2022-12-01 DIAGNOSIS — J449 Chronic obstructive pulmonary disease, unspecified: Secondary | ICD-10-CM | POA: Diagnosis not present

## 2022-12-01 DIAGNOSIS — I10 Essential (primary) hypertension: Secondary | ICD-10-CM | POA: Diagnosis present

## 2022-12-01 DIAGNOSIS — Z981 Arthrodesis status: Secondary | ICD-10-CM | POA: Diagnosis not present

## 2022-12-01 DIAGNOSIS — M4804 Spinal stenosis, thoracic region: Secondary | ICD-10-CM | POA: Diagnosis not present

## 2022-12-01 DIAGNOSIS — M7138 Other bursal cyst, other site: Secondary | ICD-10-CM | POA: Diagnosis present

## 2022-12-01 DIAGNOSIS — F319 Bipolar disorder, unspecified: Secondary | ICD-10-CM | POA: Diagnosis not present

## 2022-12-01 DIAGNOSIS — Z9889 Other specified postprocedural states: Secondary | ICD-10-CM

## 2022-12-01 DIAGNOSIS — M7130 Other bursal cyst, unspecified site: Principal | ICD-10-CM

## 2022-12-01 DIAGNOSIS — Z743 Need for continuous supervision: Secondary | ICD-10-CM | POA: Diagnosis not present

## 2022-12-01 DIAGNOSIS — R69 Illness, unspecified: Secondary | ICD-10-CM | POA: Diagnosis not present

## 2022-12-01 DIAGNOSIS — M549 Dorsalgia, unspecified: Secondary | ICD-10-CM | POA: Diagnosis not present

## 2022-12-01 HISTORY — PX: LUMBAR LAMINECTOMY/DECOMPRESSION MICRODISCECTOMY: SHX5026

## 2022-12-01 LAB — CBC WITH DIFFERENTIAL/PLATELET
Abs Immature Granulocytes: 0.02 10*3/uL (ref 0.00–0.07)
Basophils Absolute: 0 10*3/uL (ref 0.0–0.1)
Basophils Relative: 0 %
Eosinophils Absolute: 0.1 10*3/uL (ref 0.0–0.5)
Eosinophils Relative: 1 %
HCT: 35.6 % — ABNORMAL LOW (ref 36.0–46.0)
Hemoglobin: 10.8 g/dL — ABNORMAL LOW (ref 12.0–15.0)
Immature Granulocytes: 0 %
Lymphocytes Relative: 24 %
Lymphs Abs: 1.7 10*3/uL (ref 0.7–4.0)
MCH: 27.8 pg (ref 26.0–34.0)
MCHC: 30.3 g/dL (ref 30.0–36.0)
MCV: 91.5 fL (ref 80.0–100.0)
Monocytes Absolute: 0.6 10*3/uL (ref 0.1–1.0)
Monocytes Relative: 8 %
Neutro Abs: 5 10*3/uL (ref 1.7–7.7)
Neutrophils Relative %: 67 %
Platelets: 317 10*3/uL (ref 150–400)
RBC: 3.89 MIL/uL (ref 3.87–5.11)
RDW: 14.1 % (ref 11.5–15.5)
WBC: 7.4 10*3/uL (ref 4.0–10.5)
nRBC: 0 % (ref 0.0–0.2)

## 2022-12-01 LAB — PROTIME-INR
INR: 1 (ref 0.8–1.2)
Prothrombin Time: 13.5 seconds (ref 11.4–15.2)

## 2022-12-01 LAB — BASIC METABOLIC PANEL
Anion gap: 10 (ref 5–15)
BUN: 10 mg/dL (ref 6–20)
CO2: 27 mmol/L (ref 22–32)
Calcium: 9.3 mg/dL (ref 8.9–10.3)
Chloride: 103 mmol/L (ref 98–111)
Creatinine, Ser: 0.63 mg/dL (ref 0.44–1.00)
GFR, Estimated: >60 mL/min (ref >60)
Glucose, Bld: 95 mg/dL (ref 70–99)
Potassium: 3.9 mmol/L (ref 3.5–5.1)
Sodium: 140 mmol/L (ref 135–145)

## 2022-12-01 LAB — APTT: aPTT: 29 seconds (ref 24–36)

## 2022-12-01 SURGERY — LUMBAR LAMINECTOMY/DECOMPRESSION MICRODISCECTOMY
Anesthesia: General

## 2022-12-01 MED ORDER — LOSARTAN POTASSIUM 50 MG PO TABS
50.0000 mg | ORAL_TABLET | Freq: Every day | ORAL | Status: DC
  Administered 2022-12-02 – 2022-12-03 (×2): 50 mg via ORAL
  Filled 2022-12-01 (×2): qty 1

## 2022-12-01 MED ORDER — ALUM & MAG HYDROXIDE-SIMETH 200-200-20 MG/5ML PO SUSP: 30.0000 mL | Freq: Four times a day (QID) | ORAL | Status: DC | PRN

## 2022-12-01 MED ORDER — BISACODYL 5 MG PO TBEC: 5.0000 mg | DELAYED_RELEASE_TABLET | Freq: Every day | ORAL | Status: DC | PRN

## 2022-12-01 MED ORDER — DIVALPROEX SODIUM 250 MG PO DR TAB
500.0000 mg | DELAYED_RELEASE_TABLET | Freq: Every day | ORAL | Status: DC
  Administered 2022-12-02 – 2022-12-03 (×2): 500 mg via ORAL
  Filled 2022-12-01 (×2): qty 2

## 2022-12-01 MED ORDER — LIDOCAINE-EPINEPHRINE 0.5 %-1:200000 IJ SOLN
INTRAMUSCULAR | Status: DC | PRN
  Administered 2022-12-01: 10 mL

## 2022-12-01 MED ORDER — LACTATED RINGERS IV SOLN: INTRAVENOUS | Status: DC | PRN

## 2022-12-01 MED ORDER — THROMBIN 5000 UNITS EX SOLR
CUTANEOUS | Status: AC
  Filled 2022-12-01: qty 10000

## 2022-12-01 MED ORDER — HYDROMORPHONE HCL 1 MG/ML IJ SOLN: 0.2500 mg | INTRAMUSCULAR | Status: DC | PRN

## 2022-12-01 MED ORDER — PHENYLEPHRINE 80 MCG/ML (10ML) SYRINGE FOR IV PUSH (FOR BLOOD PRESSURE SUPPORT)
PREFILLED_SYRINGE | INTRAVENOUS | Status: AC
  Filled 2022-12-01: qty 10

## 2022-12-01 MED ORDER — BUPIVACAINE HCL (PF) 0.5 % IJ SOLN
INTRAMUSCULAR | Status: AC
  Filled 2022-12-01: qty 30

## 2022-12-01 MED ORDER — CEFAZOLIN SODIUM-DEXTROSE 2-4 GM/100ML-% IV SOLN
INTRAVENOUS | Status: AC
  Filled 2022-12-01: qty 100

## 2022-12-01 MED ORDER — VITAMIN D 25 MCG (1000 UNIT) PO TABS
1000.0000 [IU] | ORAL_TABLET | Freq: Every day | ORAL | Status: DC
  Administered 2022-12-02 – 2022-12-03 (×2): 1000 [IU] via ORAL
  Filled 2022-12-01 (×2): qty 1

## 2022-12-01 MED ORDER — SODIUM CHLORIDE 0.9 % IV SOLN: 250.0000 mL | INTRAVENOUS | Status: DC

## 2022-12-01 MED ORDER — MEPERIDINE HCL 25 MG/ML IJ SOLN: 6.2500 mg | INTRAMUSCULAR | Status: DC | PRN

## 2022-12-01 MED ORDER — SODIUM CHLORIDE 0.9% FLUSH: 3.0000 mL | INTRAVENOUS | Status: DC | PRN

## 2022-12-01 MED ORDER — MORPHINE SULFATE (PF) 4 MG/ML IV SOLN
4.0000 mg | Freq: Once | INTRAVENOUS | Status: AC
  Administered 2022-12-01: 4 mg via INTRAVENOUS
  Filled 2022-12-01: qty 1

## 2022-12-01 MED ORDER — ALBUTEROL SULFATE (2.5 MG/3ML) 0.083% IN NEBU: 3.0000 mL | INHALATION_SOLUTION | RESPIRATORY_TRACT | Status: DC | PRN

## 2022-12-01 MED ORDER — ACETAMINOPHEN 650 MG RE SUPP: 650.0000 mg | RECTAL | Status: DC | PRN

## 2022-12-01 MED ORDER — THROMBIN 5000 UNITS EX SOLR
OROMUCOSAL | Status: DC | PRN
  Administered 2022-12-01: 5 mL via TOPICAL

## 2022-12-01 MED ORDER — DEXAMETHASONE SODIUM PHOSPHATE 10 MG/ML IJ SOLN
INTRAMUSCULAR | Status: AC
  Filled 2022-12-01: qty 1

## 2022-12-01 MED ORDER — CEFAZOLIN SODIUM-DEXTROSE 2-3 GM-%(50ML) IV SOLR
INTRAVENOUS | Status: DC | PRN
  Administered 2022-12-01: 2 g via INTRAVENOUS

## 2022-12-01 MED ORDER — FENTANYL CITRATE (PF) 100 MCG/2ML IJ SOLN
INTRAMUSCULAR | Status: DC | PRN
  Administered 2022-12-01: 50 ug via INTRAVENOUS
  Administered 2022-12-01: 100 ug via INTRAVENOUS

## 2022-12-01 MED ORDER — MAGNESIUM CITRATE PO SOLN: 1.0000 | Freq: Once | ORAL | Status: DC | PRN

## 2022-12-01 MED ORDER — PROPOFOL 10 MG/ML IV BOLUS
INTRAVENOUS | Status: DC | PRN
  Administered 2022-12-01: 120 mg via INTRAVENOUS

## 2022-12-01 MED ORDER — PROMETHAZINE HCL 25 MG/ML IJ SOLN: 6.2500 mg | INTRAMUSCULAR | Status: DC | PRN

## 2022-12-01 MED ORDER — ONDANSETRON HCL 4 MG PO TABS: 4.0000 mg | ORAL_TABLET | Freq: Four times a day (QID) | ORAL | Status: DC | PRN

## 2022-12-01 MED ORDER — BUPIVACAINE HCL (PF) 0.5 % IJ SOLN
INTRAMUSCULAR | Status: DC | PRN
  Administered 2022-12-01: 20 mL

## 2022-12-01 MED ORDER — DEXAMETHASONE SODIUM PHOSPHATE 10 MG/ML IJ SOLN
4.0000 mg | Freq: Four times a day (QID) | INTRAMUSCULAR | Status: AC
  Administered 2022-12-01 – 2022-12-02 (×2): 4 mg via INTRAVENOUS
  Filled 2022-12-01 (×2): qty 1

## 2022-12-01 MED ORDER — OXYCODONE HCL 5 MG/5ML PO SOLN: 5.0000 mg | Freq: Once | ORAL | Status: DC | PRN

## 2022-12-01 MED ORDER — OXYCODONE HCL 5 MG PO TABS: 10.0000 mg | ORAL_TABLET | ORAL | Status: DC | PRN

## 2022-12-01 MED ORDER — PHENOL 1.4 % MT LIQD: 1.0000 | OROMUCOSAL | Status: DC | PRN

## 2022-12-01 MED ORDER — FERROUS GLUCONATE 324 (38 FE) MG PO TABS
324.0000 mg | ORAL_TABLET | Freq: Every day | ORAL | Status: DC
  Administered 2022-12-02 – 2022-12-03 (×2): 324 mg via ORAL
  Filled 2022-12-01 (×2): qty 1

## 2022-12-01 MED ORDER — PHENYLEPHRINE HCL-NACL 20-0.9 MG/250ML-% IV SOLN
INTRAVENOUS | Status: DC | PRN
  Administered 2022-12-01: 40 ug/min via INTRAVENOUS

## 2022-12-01 MED ORDER — POTASSIUM CHLORIDE IN NACL 20-0.9 MEQ/L-% IV SOLN
INTRAVENOUS | Status: DC
  Filled 2022-12-01: qty 1000

## 2022-12-01 MED ORDER — DEXAMETHASONE 4 MG PO TABS
4.0000 mg | ORAL_TABLET | Freq: Four times a day (QID) | ORAL | Status: AC
  Administered 2022-12-02 (×2): 4 mg via ORAL
  Filled 2022-12-01 (×4): qty 1

## 2022-12-01 MED ORDER — ACETAMINOPHEN 325 MG PO TABS: 650.0000 mg | ORAL_TABLET | ORAL | Status: DC | PRN

## 2022-12-01 MED ORDER — BUDESON-GLYCOPYRROL-FORMOTEROL 160-9-4.8 MCG/ACT IN AERO: 2.0000 | INHALATION_SPRAY | Freq: Two times a day (BID) | RESPIRATORY_TRACT | Status: DC | PRN

## 2022-12-01 MED ORDER — ACETAMINOPHEN 10 MG/ML IV SOLN
INTRAVENOUS | Status: DC | PRN
  Administered 2022-12-01: 1000 mg via INTRAVENOUS

## 2022-12-01 MED ORDER — LIDOCAINE 2% (20 MG/ML) 5 ML SYRINGE
INTRAMUSCULAR | Status: AC
  Filled 2022-12-01: qty 5

## 2022-12-01 MED ORDER — DEXAMETHASONE SODIUM PHOSPHATE 10 MG/ML IJ SOLN
INTRAMUSCULAR | Status: DC | PRN
  Administered 2022-12-01: 10 mg via INTRAVENOUS

## 2022-12-01 MED ORDER — LIDOCAINE HCL (CARDIAC) PF 100 MG/5ML IV SOSY
PREFILLED_SYRINGE | INTRAVENOUS | Status: DC | PRN
  Administered 2022-12-01: 20 mg via INTRAVENOUS

## 2022-12-01 MED ORDER — ROSUVASTATIN CALCIUM 20 MG PO TABS
20.0000 mg | ORAL_TABLET | Freq: Every day | ORAL | Status: DC
  Administered 2022-12-02 – 2022-12-03 (×2): 20 mg via ORAL
  Filled 2022-12-01 (×2): qty 1

## 2022-12-01 MED ORDER — SUGAMMADEX SODIUM 200 MG/2ML IV SOLN
INTRAVENOUS | Status: DC | PRN
  Administered 2022-12-01: 200 mg via INTRAVENOUS
  Administered 2022-12-01: 100 mg via INTRAVENOUS

## 2022-12-01 MED ORDER — SENNA 8.6 MG PO TABS
1.0000 | ORAL_TABLET | Freq: Two times a day (BID) | ORAL | Status: DC
  Administered 2022-12-02 – 2022-12-03 (×3): 8.6 mg via ORAL
  Filled 2022-12-01 (×3): qty 1

## 2022-12-01 MED ORDER — ACETAMINOPHEN 10 MG/ML IV SOLN
INTRAVENOUS | Status: AC
  Filled 2022-12-01: qty 100

## 2022-12-01 MED ORDER — OXYCODONE HCL 5 MG PO TABS: 5.0000 mg | ORAL_TABLET | ORAL | Status: DC | PRN

## 2022-12-01 MED ORDER — BUPROPION HCL ER (XL) 150 MG PO TB24
150.0000 mg | ORAL_TABLET | Freq: Every day | ORAL | Status: DC
  Administered 2022-12-02 – 2022-12-03 (×2): 150 mg via ORAL
  Filled 2022-12-01 (×2): qty 1

## 2022-12-01 MED ORDER — DIAZEPAM 5 MG PO TABS: 5.0000 mg | ORAL_TABLET | Freq: Four times a day (QID) | ORAL | Status: DC | PRN

## 2022-12-01 MED ORDER — MIDAZOLAM HCL 5 MG/5ML IJ SOLN
INTRAMUSCULAR | Status: DC | PRN
  Administered 2022-12-01: 2 mg via INTRAVENOUS

## 2022-12-01 MED ORDER — FENTANYL CITRATE (PF) 250 MCG/5ML IJ SOLN
INTRAMUSCULAR | Status: AC
  Filled 2022-12-01: qty 5

## 2022-12-01 MED ORDER — CELECOXIB 200 MG PO CAPS
200.0000 mg | ORAL_CAPSULE | Freq: Two times a day (BID) | ORAL | Status: DC
  Administered 2022-12-01 – 2022-12-03 (×4): 200 mg via ORAL
  Filled 2022-12-01 (×4): qty 1

## 2022-12-01 MED ORDER — MIDAZOLAM HCL 2 MG/2ML IJ SOLN
INTRAMUSCULAR | Status: AC
  Filled 2022-12-01: qty 2

## 2022-12-01 MED ORDER — ZOLPIDEM TARTRATE 5 MG PO TABS: 5.0000 mg | ORAL_TABLET | Freq: Every evening | ORAL | Status: DC | PRN

## 2022-12-01 MED ORDER — ONDANSETRON HCL 4 MG/2ML IJ SOLN
INTRAMUSCULAR | Status: DC | PRN
  Administered 2022-12-01: 4 mg via INTRAVENOUS

## 2022-12-01 MED ORDER — SODIUM CHLORIDE 0.9% FLUSH
3.0000 mL | Freq: Two times a day (BID) | INTRAVENOUS | Status: DC
  Administered 2022-12-02: 3 mL via INTRAVENOUS

## 2022-12-01 MED ORDER — ONDANSETRON HCL 4 MG/2ML IJ SOLN: 4.0000 mg | Freq: Four times a day (QID) | INTRAMUSCULAR | Status: DC | PRN

## 2022-12-01 MED ORDER — MIDAZOLAM HCL 2 MG/2ML IJ SOLN: 0.5000 mg | Freq: Once | INTRAMUSCULAR | Status: DC | PRN

## 2022-12-01 MED ORDER — ONDANSETRON HCL 4 MG/2ML IJ SOLN
4.0000 mg | Freq: Once | INTRAMUSCULAR | Status: AC
  Administered 2022-12-01: 4 mg via INTRAVENOUS
  Filled 2022-12-01: qty 2

## 2022-12-01 MED ORDER — ROCURONIUM BROMIDE 10 MG/ML (PF) SYRINGE
PREFILLED_SYRINGE | INTRAVENOUS | Status: AC
  Filled 2022-12-01: qty 10

## 2022-12-01 MED ORDER — OXYCODONE HCL 5 MG PO TABS: 5.0000 mg | ORAL_TABLET | Freq: Once | ORAL | Status: DC | PRN

## 2022-12-01 MED ORDER — THROMBIN (RECOMBINANT) 5000 UNITS EX SOLR
CUTANEOUS | Status: DC | PRN
  Administered 2022-12-01: 10 mL via TOPICAL

## 2022-12-01 MED ORDER — LIDOCAINE-EPINEPHRINE 0.5 %-1:200000 IJ SOLN
INTRAMUSCULAR | Status: AC
  Filled 2022-12-01: qty 1

## 2022-12-01 MED ORDER — THROMBIN 5000 UNITS EX SOLR
CUTANEOUS | Status: AC
  Filled 2022-12-01: qty 5000

## 2022-12-01 MED ORDER — SENNOSIDES-DOCUSATE SODIUM 8.6-50 MG PO TABS: 1.0000 | ORAL_TABLET | Freq: Every evening | ORAL | Status: DC | PRN

## 2022-12-01 MED ORDER — HYDROCODONE-ACETAMINOPHEN 7.5-325 MG PO TABS
1.0000 | ORAL_TABLET | Freq: Four times a day (QID) | ORAL | Status: DC
  Administered 2022-12-02 – 2022-12-03 (×5): 1 via ORAL
  Filled 2022-12-01 (×5): qty 1

## 2022-12-01 MED ORDER — 0.9 % SODIUM CHLORIDE (POUR BTL) OPTIME
TOPICAL | Status: DC | PRN
  Administered 2022-12-01: 1000 mL

## 2022-12-01 MED ORDER — ROCURONIUM BROMIDE 100 MG/10ML IV SOLN
INTRAVENOUS | Status: DC | PRN
  Administered 2022-12-01: 70 mg via INTRAVENOUS
  Administered 2022-12-01: 20 mg via INTRAVENOUS

## 2022-12-01 MED ORDER — MENTHOL 3 MG MT LOZG: 1.0000 | LOZENGE | OROMUCOSAL | Status: DC | PRN

## 2022-12-01 MED ORDER — GABAPENTIN 300 MG PO CAPS
300.0000 mg | ORAL_CAPSULE | Freq: Three times a day (TID) | ORAL | Status: DC
  Administered 2022-12-01 – 2022-12-03 (×6): 300 mg via ORAL
  Filled 2022-12-01 (×6): qty 1

## 2022-12-01 MED ORDER — PHENYLEPHRINE HCL (PRESSORS) 10 MG/ML IV SOLN
INTRAVENOUS | Status: DC | PRN
  Administered 2022-12-01 (×2): 160 ug via INTRAVENOUS

## 2022-12-01 MED ORDER — MORPHINE SULFATE (PF) 2 MG/ML IV SOLN: 2.0000 mg | INTRAVENOUS | Status: DC | PRN

## 2022-12-01 MED ORDER — ONDANSETRON HCL 4 MG/2ML IJ SOLN
INTRAMUSCULAR | Status: AC
  Filled 2022-12-01: qty 2

## 2022-12-01 SURGICAL SUPPLY — 50 items
BAG COUNTER SPONGE SURGICOUNT (BAG) ×1 IMPLANT
BAND RUBBER #18 3X1/16 STRL (MISCELLANEOUS) ×2 IMPLANT
BENZOIN TINCTURE PRP APPL 2/3 (GAUZE/BANDAGES/DRESSINGS) IMPLANT
BLADE CLIPPER SURG (BLADE) IMPLANT
BUR MATCHSTICK NEURO 3.0 LAGG (BURR) ×1 IMPLANT
BUR ROUND FLUTED 5 RND (BURR) IMPLANT
CANISTER SUCT 3000ML PPV (MISCELLANEOUS) ×1 IMPLANT
CNTNR URN SCR LID CUP LEK RST (MISCELLANEOUS) IMPLANT
CONT SPEC 4OZ STRL OR WHT (MISCELLANEOUS) ×1
DERMABOND ADVANCED .7 DNX12 (GAUZE/BANDAGES/DRESSINGS) ×1 IMPLANT
DRAPE C-ARM 35X43 STRL (DRAPES) IMPLANT
DRAPE LAPAROTOMY 100X72X124 (DRAPES) ×1 IMPLANT
DRAPE MICROSCOPE SLANT 54X150 (MISCELLANEOUS) ×1 IMPLANT
DRAPE SURG 17X23 STRL (DRAPES) ×1 IMPLANT
DRSG OPSITE POSTOP 4X8 (GAUZE/BANDAGES/DRESSINGS) IMPLANT
DURAPREP 26ML APPLICATOR (WOUND CARE) ×1 IMPLANT
ELECT REM PT RETURN 9FT ADLT (ELECTROSURGICAL) ×1
ELECTRODE REM PT RTRN 9FT ADLT (ELECTROSURGICAL) ×1 IMPLANT
GAUZE 4X4 16PLY ~~LOC~~+RFID DBL (SPONGE) IMPLANT
GAUZE SPONGE 4X4 12PLY STRL (GAUZE/BANDAGES/DRESSINGS) IMPLANT
GLOVE EXAM NITRILE XL STR (GLOVE) IMPLANT
GLOVE SURG LTX SZ6.5 (GLOVE) ×1 IMPLANT
GOWN STRL REUS W/ TWL LRG LVL3 (GOWN DISPOSABLE) ×2 IMPLANT
GOWN STRL REUS W/ TWL XL LVL3 (GOWN DISPOSABLE) IMPLANT
GOWN STRL REUS W/TWL 2XL LVL3 (GOWN DISPOSABLE) IMPLANT
GOWN STRL REUS W/TWL LRG LVL3 (GOWN DISPOSABLE) ×2
GOWN STRL REUS W/TWL XL LVL3 (GOWN DISPOSABLE)
HEMOSTAT POWDER KIT SURGIFOAM (HEMOSTASIS) IMPLANT
KIT BASIN OR (CUSTOM PROCEDURE TRAY) ×1 IMPLANT
KIT TURNOVER KIT B (KITS) ×1 IMPLANT
NDL HYPO 25GX1X1/2 BEV (NEEDLE) IMPLANT
NDL HYPO 25X1 1.5 SAFETY (NEEDLE) ×1 IMPLANT
NDL SPNL 18GX3.5 QUINCKE PK (NEEDLE) IMPLANT
NEEDLE HYPO 25GX1X1/2 BEV (NEEDLE) ×1 IMPLANT
NEEDLE HYPO 25X1 1.5 SAFETY (NEEDLE) ×1 IMPLANT
NEEDLE SPNL 18GX3.5 QUINCKE PK (NEEDLE) IMPLANT
NS IRRIG 1000ML POUR BTL (IV SOLUTION) ×1 IMPLANT
PACK LAMINECTOMY NEURO (CUSTOM PROCEDURE TRAY) ×1 IMPLANT
PAD ARMBOARD 7.5X6 YLW CONV (MISCELLANEOUS) ×3 IMPLANT
SPIKE FLUID TRANSFER (MISCELLANEOUS) ×1 IMPLANT
SPONGE SURGIFOAM ABS GEL SZ50 (HEMOSTASIS) ×1 IMPLANT
SPONGE T-LAP 4X18 ~~LOC~~+RFID (SPONGE) IMPLANT
STRIP CLOSURE SKIN 1/2X4 (GAUZE/BANDAGES/DRESSINGS) IMPLANT
SUT VIC AB 0 CT1 18XCR BRD8 (SUTURE) ×1 IMPLANT
SUT VIC AB 0 CT1 8-18 (SUTURE) ×1
SUT VIC AB 2-0 CT1 18 (SUTURE) ×1 IMPLANT
SUT VIC AB 3-0 SH 8-18 (SUTURE) ×1 IMPLANT
TOWEL GREEN STERILE (TOWEL DISPOSABLE) ×1 IMPLANT
TOWEL GREEN STERILE FF (TOWEL DISPOSABLE) ×1 IMPLANT
WATER STERILE IRR 1000ML POUR (IV SOLUTION) ×1 IMPLANT

## 2022-12-01 NOTE — Op Note (Signed)
12/01/2022  10:15 PM  PATIENT:  Cynthia [name]  61 y.o. female  PRE-OPERATIVE DIAGNOSIS:  SYNOVIAL CYST T1-T2  POST-OPERATIVE DIAGNOSIS:  SYNOVIAL CYST T1-T2  PROCEDURE:  Procedure(s): THORACIC ONE, THORACIC TWO LAMINECTOMY/DECOMPRESSION FOR SYNOVIAL CYST RESECTION  SURGEON: Surgeon(s): Coletta Memos, MD  ASSISTANTS:none  ANESTHESIA:   general  EBL:  Total I/O In: 700 [I.V.:700] Out: 100 [Blood:100]  BLOOD ADMINISTERED:none  CELL SAVER GIVEN:not used  COUNT:per nursing  DRAINS: none   SPECIMEN:  Source of Specimen:  synovial cyst  DICTATION: Cynthia [name] was taken to the operating room, intubated, and placed under a general anesthetic without difficulty. She was positioned prone on a wilson frame with all pressure points properly padded. She was prepped and draped in a sterile manner.  Her thoracic region was prepped and draped in a sterile manner. I used fluoroscopy to plan the incision. I made the incision with a 10 blade. I exposed the lamina of T1, and T2 and confirmed my location with fluoroscopy.  I used the drill to perform hemilaminectomies on the left side of both T1 and T2. I encountered the cyst and dissected it free from the thecal sac. I did send speciment to the lab. I worked on the cyst until I could not appreciate a mass, nor pressure on the thecal sac.  I then closed the wound after injecting marcaine into the paraspinous musculature. I approximated the thoracolumbar fascia, the subcutaneous plane, and the subcutaneous plane with vicryl suture. I used dermabond and an occlusive bandage for the dressing. She was moved to the stretcher, then extubated. PLAN OF CARE: Admit to inpatient   PATIENT DISPOSITION:  PACU - hemodynamically stable.   Delay start of Pharmacological VTE agent (>24hrs) due to surgical blood loss or risk of bleeding:  yes

## 2022-12-01 NOTE — Anesthesia Preprocedure Evaluation (Addendum)
Anesthesia Evaluation  Patient identified by MRN, date of birth, ID band Patient awake    Reviewed: Allergy & Precautions, NPO status , Patient's Chart, lab work & pertinent test results  History of Anesthesia Complications Negative for: history of anesthetic complications  Airway Mallampati: II  TM Distance: >3 FB Neck ROM: Full    Dental  (+) Dental Advisory Given   Pulmonary asthma , COPD,  COPD inhaler   breath sounds clear to auscultation       Cardiovascular hypertension, Pt. on medications (-) angina  Rhythm:Regular Rate:Normal     Neuro/Psych     Bipolar Disorder   Bilat lower extrem weakness vertigo    GI/Hepatic Neg liver ROS,GERD  Controlled,,  Endo/Other    Morbid obesityBMI 45  Renal/GU negative Renal ROS     Musculoskeletal   Abdominal  (+) + obese  Peds  Hematology  (+) Blood dyscrasia (Hb 10.8), anemia   Anesthesia Other Findings   Reproductive/Obstetrics                             Anesthesia Physical Anesthesia Plan  ASA: 3 and emergent  Anesthesia Plan: General   Post-op Pain Management: Ofirmev IV (intra-op)*   Induction: Intravenous  PONV Risk Score and Plan: 3 and Ondansetron, Dexamethasone and Scopolamine patch - Pre-op  Airway Management Planned: Oral ETT  Additional Equipment: None  Intra-op Plan:   Post-operative Plan: Extubation in OR  Informed Consent: I have reviewed the patients History and Physical, chart, labs and discussed the procedure including the risks, benefits and alternatives for the proposed anesthesia with the patient or authorized representative who has indicated his/her understanding and acceptance.     Dental advisory given  Plan Discussed with: CRNA and Surgeon  Anesthesia Plan Comments:         Anesthesia Quick Evaluation

## 2022-12-01 NOTE — H&P (Signed)
Cynthia [name] is a 61 y.o. female With a history of neck pain, left upper extremity pain along with pain in the left chest, and weakness in the left lower extremity.  She was scheduled for the OR this Thursday, but called today stating that she cannot walk due to weakness in the left lower extremity. She was seen in our office on 12/22 and noted to be weak in both lower extremities.  No Known Allergies Past Medical History:  Diagnosis Date   Bipolar 1 disorder (HCC)    Essential hypertension    GERD (gastroesophageal reflux disease)    Past Surgical History:  Procedure Laterality Date   NO PAST SURGERIES     Family History  Problem Relation Age of Onset   Cancer Mother        lung   Cancer Father        prostate   Diabetes Paternal Grandfather    Social History   Socioeconomic History   Marital status: Divorced    Spouse name: Not on file   Number of children: 3   Years of education: Not on file   Highest education level: Not on file  Occupational History   Not on file  Tobacco Use   Smoking status: Never   Smokeless tobacco: Never  Vaping Use   Vaping Use: Never used  Substance and Sexual Activity   Alcohol use: Not Currently    Comment: rarely   Drug use: Never   Sexual activity: Yes    Partners: Male  Other Topics Concern   Not on file  Social History Narrative   Not on file   Social Determinants of Health   Financial Resource Strain: Low Risk  (08/18/2021)   Overall Financial Resource Strain (CARDIA)    Difficulty of Paying Living Expenses: Not hard at all  Food Insecurity: No Food Insecurity (08/18/2021)   Hunger Vital Sign    Worried About Running Out of Food in the Last Year: Never true    Ran Out of Food in the Last Year: Never true  Transportation Needs: No Transportation Needs (08/18/2021)   PRAPARE - Administrator, Civil Service (Medical): No    Lack of Transportation (Non-Medical): No  Physical Activity: Inactive (08/18/2021)    Exercise Vital Sign    Days of Exercise per Week: 0 days    Minutes of Exercise per Session: 0 min  Stress: No Stress Concern Present (08/18/2021)   [employer] of Occupational Health - Occupational Stress Questionnaire    Feeling of Stress : Not at all  Social Connections: Socially Isolated (08/18/2021)   Social Connection and Isolation Panel [NHANES]    Frequency of Communication with Friends and Family: More than three times a week    Frequency of Social Gatherings with Friends and Family: More than three times a week    Attends Religious Services: Never    Database administrator or Organizations: No    Attends Banker Meetings: Never    Marital Status: Divorced  Catering manager Violence: Not At Risk (08/18/2021)   Humiliation, Afraid, Rape, and Kick questionnaire    Fear of Current or Ex-Partner: No    Emotionally Abused: No    Physically Abused: No    Sexually Abused: No   Prior to Admission medications   Medication Sig Start Date End Date Taking? Authorizing Provider  albuterol (VENTOLIN HFA) 108 (90 Base) MCG/ACT inhaler Inhale 1-2 puffs into the lungs every 4 (four) hours  as needed for wheezing or shortness of breath. 01/24/22   Marianne Sofia, PA-C  Budeson-Glycopyrrol-Formoterol (BREZTRI AEROSPHERE) 160-9-4.8 MCG/ACT AERO Inhale 2 puffs into the lungs 2 (two) times daily as needed. 02/20/22   Abigail Miyamoto, MD  buPROPion (WELLBUTRIN XL) 150 MG 24 hr tablet Take 1 tablet (150 mg total) by mouth daily. For depression 09/08/20   Armandina Stammer I, NP  cholecalciferol (VITAMIN D3) 25 MCG (1000 UNIT) tablet Take 1,000 Units by mouth daily.    [provider]  divalproex (DEPAKOTE) 500 MG DR tablet Take 1 tablet (500 mg total) by mouth every 12 (twelve) hours. For mood stabilization Patient taking differently: Take 500 mg by mouth daily. For mood stabilization 09/07/20   Armandina Stammer I, NP  ferrous gluconate (FERGON) 324 MG tablet Take 324 mg by mouth daily.  11/14/21   [provider]  HYDROcodone-acetaminophen (NORCO/VICODIN) 5-325 MG tablet Take 1 tablet by mouth every 4 (four) hours as needed for severe pain (OSteoarthritis of lumbar spine.). 11/26/22   CoxFritzi Mandes, MD  losartan (COZAAR) 50 MG tablet Take 1 tablet (50 mg total) by mouth daily. 09/17/22   CoxFritzi Mandes, MD  meclizine (ANTIVERT) 25 MG tablet Take 1 tablet (25 mg total) by mouth 3 (three) times daily as needed for dizziness. Patient not taking: Reported on 11/28/2022 05/01/22   CoxFritzi Mandes, MD  rosuvastatin (CRESTOR) 20 MG tablet Take 20 mg by mouth daily.    [provider]   Physical Exam Constitutional:      Appearance: Normal appearance.  HENT:     Head: Normocephalic and atraumatic.     Right Ear: External ear normal.     Left Ear: External ear normal.     Nose: Nose normal.     Mouth/Throat:     Mouth: Mucous membranes are moist.     Pharynx: Oropharynx is clear.  Eyes:     Extraocular Movements: Extraocular movements intact.     Conjunctiva/sclera: Conjunctivae normal.     Pupils: Pupils are equal, round, and reactive to light.  Cardiovascular:     Rate and Rhythm: Normal rate and regular rhythm.  Pulmonary:     Effort: Pulmonary effort is normal.  Musculoskeletal:        General: Normal range of motion.     Cervical back: Normal range of motion.  Neurological:     Mental Status: She is alert and oriented to person, place, and time.     Cranial Nerves: Cranial nerves 2-12 are intact.     Sensory: Sensory deficit present.     Motor: Weakness present. No pronator drift.     Coordination: Coordination abnormal. Heel to [facility] Test abnormal.     Deep Tendon Reflexes:     Reflex Scores:      Tricep reflexes are 2+ on the right side and 2+ on the left side.      Bicep reflexes are 2+ on the right side and 2+ on the left side.      Brachioradialis reflexes are 2+ on the right side and 2+ on the left side.      Patellar reflexes are 3+ on the right  side and 3+ on the left side.      Achilles reflexes are 3+ on the right side and 3+ on the left side.  OR for operative decompression thoracic spinal canal T1,T2

## 2022-12-01 NOTE — Transfer of Care (Signed)
Immediate Anesthesia Transfer of Care Note  Patient: Cynthia [name]  Procedure(s) Performed: THORACIC ONE, THORACIC TWO LAMINECTOMY/DECOMPRESSION FOR SYNOVIAL CYST RESECTION  Patient Location: PACU  Anesthesia Type:General  Level of Consciousness: awake, alert , and oriented  Airway & Oxygen Therapy: Patient Spontanous Breathing and Patient connected to face mask oxygen  Post-op Assessment: Report given to RN, Post -op Vital signs reviewed and stable, and Patient moving all extremities  Post vital signs: Reviewed and stable  Last Vitals:  Vitals Value Taken Time  BP 145/88 12/01/22 2145  Temp    Pulse 91 12/01/22 2145  Resp 14 12/01/22 2145  SpO2 90 % 12/01/22 2145  Vitals shown include unvalidated device data.  Last Pain:  Vitals:   12/01/22 1526  PainSc: 6          Complications: No notable events documented.

## 2022-12-01 NOTE — ED Triage Notes (Signed)
Arrives via EMS from home with cyst on base of spine for 2 months, PCP told her to come to ED immediately. Also C/O loss of sensation in Bilat legs starting this AM, unable to move left leg, able to move right leg a little. VSS

## 2022-12-01 NOTE — ED Provider Notes (Signed)
[facility] EMERGENCY DEPARTMENT Provider Note   CSN: [id number] Arrival date & time: 12/01/22  1520     History  Chief Complaint  Patient presents with   Loss of sensation in legs    Cyst    At base of spine, present for 2 months    Cynthia [name] is a 61 y.o. female.  Pt is a 61 yo female with pmhx significant for HTN, GERD, and bipolar d/o.  Pt was also recently diagnosed with a synovial cyst by MRI on the 19th.  The pt is scheduled for surgery with Dr. Jake Samples on 12/28 for removal.  Pt said the sensation to both legs are worsening and she can't move the left leg very well.  She can't walk.  She said her daughter called NS who told him to come to the ED.          Home Medications Prior to Admission medications   Medication Sig Start Date End Date Taking? Authorizing Provider  albuterol (VENTOLIN HFA) 108 (90 Base) MCG/ACT inhaler Inhale 1-2 puffs into the lungs every 4 (four) hours as needed for wheezing or shortness of breath. 01/24/22   Marianne Sofia, PA-C  Budeson-Glycopyrrol-Formoterol (BREZTRI AEROSPHERE) 160-9-4.8 MCG/ACT AERO Inhale 2 puffs into the lungs 2 (two) times daily as needed. 02/20/22   Abigail Miyamoto, MD  buPROPion (WELLBUTRIN XL) 150 MG 24 hr tablet Take 1 tablet (150 mg total) by mouth daily. For depression 09/08/20   Armandina Stammer I, NP  cholecalciferol (VITAMIN D3) 25 MCG (1000 UNIT) tablet Take 1,000 Units by mouth daily.    [provider]  divalproex (DEPAKOTE) 500 MG DR tablet Take 1 tablet (500 mg total) by mouth every 12 (twelve) hours. For mood stabilization Patient taking differently: Take 500 mg by mouth daily. For mood stabilization 09/07/20   Armandina Stammer I, NP  ferrous gluconate (FERGON) 324 MG tablet Take 324 mg by mouth daily. 11/14/21   [provider]  HYDROcodone-acetaminophen (NORCO/VICODIN) 5-325 MG tablet Take 1 tablet by mouth every 4 (four) hours as needed for severe pain (OSteoarthritis of  lumbar spine.). 11/26/22   CoxFritzi Mandes, MD  losartan (COZAAR) 50 MG tablet Take 1 tablet (50 mg total) by mouth daily. 09/17/22   CoxFritzi Mandes, MD  meclizine (ANTIVERT) 25 MG tablet Take 1 tablet (25 mg total) by mouth 3 (three) times daily as needed for dizziness. Patient not taking: Reported on 11/28/2022 05/01/22   CoxFritzi Mandes, MD  rosuvastatin (CRESTOR) 20 MG tablet Take 20 mg by mouth daily.    [provider]      Allergies    Patient has no known allergies.    Review of Systems   Review of Systems  Neurological:  Positive for weakness and numbness.  All other systems reviewed and are negative.   Physical Exam Updated Vital Signs BP (!) 176/90 (BP Location: Right Arm)   Pulse 86   Ht 5\' 3"  (1.6 m)   Wt 115 kg   LMP  (LMP Unknown)   SpO2 95%   BMI 44.91 kg/m  Physical Exam Vitals and nursing note reviewed.  Constitutional:      Appearance: Normal appearance. She is obese.  HENT:     Head: Normocephalic and atraumatic.     Right Ear: External ear normal.     Left Ear: External ear normal.     Nose: Nose normal.     Mouth/Throat:     Mouth: Mucous membranes are dry.  Eyes:     Extraocular Movements: Extraocular movements intact.     Conjunctiva/sclera: Conjunctivae normal.     Pupils: Pupils are equal, round, and reactive to light.  Cardiovascular:     Rate and Rhythm: Normal rate and regular rhythm.     Pulses: Normal pulses.     Heart sounds: Normal heart sounds.  Pulmonary:     Effort: Pulmonary effort is normal.     Breath sounds: Normal breath sounds.  Abdominal:     General: Abdomen is flat. Bowel sounds are normal.     Palpations: Abdomen is soft.  Musculoskeletal:     Cervical back: Normal range of motion and neck supple.  Skin:    General: Skin is warm.     Capillary Refill: Capillary refill takes less than 2 seconds.  Neurological:     Mental Status: She is alert and oriented to person, place, and time.     Comments: Weakness BLE.  L  worse than R.  BLE decreased sensation to both legs.      ED Results / Procedures / Treatments   Labs (all labs ordered are listed, but only abnormal results are displayed) Labs Reviewed  CBC WITH DIFFERENTIAL/PLATELET - Abnormal; Notable for the following components:      Result Value   Hemoglobin 10.8 (*)    HCT 35.6 (*)    All other components within normal limits  BASIC METABOLIC PANEL  PROTIME-INR  APTT  URINALYSIS, ROUTINE W REFLEX MICROSCOPIC    EKG None  Radiology No results found.  Procedures Procedures    Medications Ordered in ED Medications  morphine (PF) 4 MG/ML injection 4 mg (4 mg Intravenous Given 12/01/22 1655)  ondansetron (ZOFRAN) injection 4 mg (4 mg Intravenous Given 12/01/22 1654)    ED Course/ Medical Decision Making/ A&P                           Medical Decision Making Amount and/or Complexity of Data Reviewed Labs: ordered.  Risk Prescription drug management.   This patient presents to the ED for concern of weakness and numbness, this involves an extensive number of treatment options, and is a complaint that carries with it a high risk of complications and morbidity.  The differential diagnosis includes enlarging synovial cyst   Co morbidities that complicate the patient evaluation  HTN, GERD, and bipolar d/o   Additional history obtained:  Additional history obtained from epic chart review External records from outside source obtained and reviewed including EMS report   Lab Tests:  I Ordered, and personally interpreted labs.  The pertinent results include:  cbc with hgb 10.8 (chronic), nl bmp, inr nl, ptt nl   Imaging Studies ordered:  I ordered imaging studies including MRI brain on 12/19  I independently visualized and interpreted imaging which showed   MPRESSION: 1. Mild foraminal narrowing bilaterally C6-7 due to facet degeneration. Mild left foraminal narrowing C7-T1 due to facet degeneration. 2. 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 Recommend follow-up MRI cervical spine with intravenous contrast to rule out other mass lesion such as meningioma.  I agree with the radiologist interpretation   Cardiac Monitoring:  The patient was maintained on a cardiac monitor.  I personally viewed and interpreted the cardiac monitored which showed an underlying rhythm of: nsr   Medicines ordered and prescription drug management:  I  ordered medication including morphine/zofran  for pain  Reevaluation of the patient after these medicines showed that the patient improved I have reviewed the patients home medicines and have made adjustments as needed   Critical Interventions:  NS consult   Consultations Obtained:  I requested consultation with the NS (Dr. Franky Macho),  and discussed lab and imaging findings as well as pertinent plan - he saw pt and will take her to the OR   Problem List / ED Course:  Synovial Cyst   Reevaluation:  After the interventions noted above, I reevaluated the patient and found that they have :improved   Social Determinants of Health:  Lives at home   Dispostion:  After consideration of the diagnostic results and the patients response to treatment, I feel that the patent would benefit from admission to OR.          Final Clinical Impression(s) / ED Diagnoses Final diagnoses:  Synovial cyst  Spinal cord compression [facility])    Rx / DC Orders ED Discharge Orders     None         Jacalyn Lefevre, MD 12/01/22 1730

## 2022-12-01 NOTE — ED Notes (Signed)
Pt transported to PACU, report given to PACU RN.

## 2022-12-01 NOTE — Anesthesia Postprocedure Evaluation (Signed)
Anesthesia Post Note  Patient: Cynthia [name]  Procedure(s) Performed: THORACIC ONE, THORACIC TWO LAMINECTOMY/DECOMPRESSION FOR SYNOVIAL CYST RESECTION     Patient location during evaluation: PACU Anesthesia Type: General Level of consciousness: awake and alert, patient cooperative and oriented Pain management: pain level controlled Vital Signs Assessment: post-procedure vital signs reviewed and stable Respiratory status: spontaneous breathing, nonlabored ventilation and respiratory function stable Cardiovascular status: blood pressure returned to baseline and stable Postop Assessment: no apparent nausea or vomiting and patient able to bend at knees Anesthetic complications: no   No notable events documented.  Last Vitals:  Vitals:   12/01/22 2145 12/01/22 2200  BP: (!) 145/88 132/84  Pulse: 92 89  Resp: 17 13  Temp:    SpO2: 90% 92%    Last Pain:  Vitals:   12/01/22 2200  PainSc: 0-No pain      LLE Sensation: Full sensation (12/01/22 2200)   RLE Sensation: Full sensation (12/01/22 2200)      Tamkia Temples,E. Lilton Pare

## 2022-12-01 NOTE — Anesthesia Procedure Notes (Signed)
Procedure Name: Intubation Date/Time: 12/01/2022 7:05 PM  Performed by: Elwin Tsou T, CRNAPre-anesthesia Checklist: Patient identified, Emergency Drugs available, Suction available and Patient being monitored Patient Re-evaluated:Patient Re-evaluated prior to induction Oxygen Delivery Method: Circle system utilized Preoxygenation: Pre-oxygenation with 100% oxygen Induction Type: IV induction Ventilation: Mask ventilation without difficulty Laryngoscope Size: Mac and 3 Grade View: Grade I Tube type: Oral Tube size: 7.0 mm Number of attempts: 1 Airway Equipment and Method: Stylet and Oral airway Placement Confirmation: ETT inserted through vocal cords under direct vision, positive ETCO2 and breath sounds checked- equal and bilateral Secured at: 22 cm Tube secured with: Tape Dental Injury: Teeth and Oropharynx as per pre-operative assessment

## 2022-12-02 ENCOUNTER — Encounter (HOSPITAL_COMMUNITY): Payer: Self-pay | Admitting: Neurosurgery

## 2022-12-02 LAB — URINALYSIS, ROUTINE W REFLEX MICROSCOPIC
Bilirubin Urine: NEGATIVE
Glucose, UA: NEGATIVE mg/dL
Hgb urine dipstick: NEGATIVE
Ketones, ur: NEGATIVE mg/dL
Leukocytes,Ua: NEGATIVE
Nitrite: NEGATIVE
Protein, ur: NEGATIVE mg/dL
Specific Gravity, Urine: 1.02 (ref 1.005–1.030)
pH: 6 (ref 5.0–8.0)

## 2022-12-02 MED ORDER — FLUTICASONE FUROATE-VILANTEROL 200-25 MCG/ACT IN AEPB
1.0000 | INHALATION_SPRAY | Freq: Every day | RESPIRATORY_TRACT | Status: DC
  Filled 2022-12-02: qty 28

## 2022-12-02 MED ORDER — UMECLIDINIUM BROMIDE 62.5 MCG/ACT IN AEPB
1.0000 | INHALATION_SPRAY | Freq: Every day | RESPIRATORY_TRACT | Status: DC
  Filled 2022-12-02: qty 7

## 2022-12-02 MED FILL — Thrombin For Soln 5000 Unit: CUTANEOUS | Qty: 2 | Status: AC

## 2022-12-02 NOTE — Evaluation (Signed)
Physical Therapy Evaluation Patient Details Name: Cynthia [name] MRN: [id number] DOB: 1961/10/29 Today's Date: 12/02/2022  History of Present Illness  Cynthia [name] is a 61 y.o. female  With a history of neck pain, left upper extremity pain along with pain in the left chest, and weakness in the left lower extremity.  She underwent T1-T2 decompression via Dr Franky Macho on 12/01/22.  Clinical Impression  Pt is presenting well below her PLOF today. Fatigues quickly able to barely ambulate house hold distance of 50 ft. Pt has family but they state they are unable to assist at pt home and pt will be alone on discharge from acute care hospital setting.  Currently recommending SNF placement on discharge in order to increase strength, mobility and decrease risk for falls, injury and re-hospitalization due to pt available assistance at home and home set up. Pt may progress to home health physical therapy if able to increase gait distance and perform stairs per home set up. Pt demonstrates significant bil quad weakness with technique for sit to stand with failed attempts and then rocking in order to compensate with momentum.      Recommendations for follow up therapy are one component of a multi-disciplinary discharge planning process, led by the attending physician.  Recommendations may be updated based on patient status, additional functional criteria and insurance authorization.  Follow Up Recommendations Skilled nursing-short term rehab (<3 hours/day) Can patient physically be transported by private vehicle: Yes    Assistance Recommended at Discharge Frequent or constant Supervision/Assistance  Patient can return home with the following  A little help with walking and/or transfers;Help with stairs or ramp for entrance;Assistance with cooking/housework    Equipment Recommendations Rolling walker (2 wheels);Other (comment)  Recommendations for Other Services       Functional Status Assessment  Patient has had a recent decline in their functional status and demonstrates the ability to make significant improvements in function in a reasonable and predictable amount of time.     Precautions / Restrictions Precautions Precautions: Fall;Cervical Precaution Booklet Issued: No Restrictions Weight Bearing Restrictions: No      Mobility  Bed Mobility               General bed mobility comments: Pt was recieved sitting in recliner and returned to recliner. pt sleeps on sofa propped up at home or in recliner due to sleep apnea. Patient Response: Cooperative  Transfers Overall transfer level: Needs assistance Equipment used: Rolling walker (2 wheels) Transfers: Sit to/from Stand Sit to Stand: Min guard           General transfer comment: pt requires increased time and verbal cues for safe hand placement with sit to stand to prevent pulling up from RW and uses rocking in order to build momentum to stand from regular height recliner due to weakness in bil LE.    Ambulation/Gait Ambulation/Gait assistance: Min guard Gait Distance (Feet): 50 Feet Assistive device: Rolling walker (2 wheels) Gait Pattern/deviations: Step-through pattern, Decreased step length - right, Decreased step length - left, Wide base of support, Decreased weight shift to left, Decreased dorsiflexion - left   Gait velocity interpretation: <1.31 ft/sec, indicative of household ambulator   General Gait Details: Pt has shoulders hiked due to pain at incision area with mobility. Improved with verbal cues to relax shoulder musculature. Pt has very slow cadence with gait with low foot clearance due to weakness in the bil LE. Pt fatigues quickly.  Stairs Stairs:  (unable to attempt today due  to bil LE weakness and fatigue with short distance gait.)          Wheelchair Mobility    Modified Rankin (Stroke Patients Only)       Balance Overall balance assessment: Needs assistance Sitting-balance  support: Bilateral upper extremity supported Sitting balance-Leahy Scale: Fair     Standing balance support: Bilateral upper extremity supported, During functional activity Standing balance-Leahy Scale: Fair Standing balance comment: Able to stand and adjust clothing without UE support.           Pertinent Vitals/Pain Pain Assessment Pain Assessment: 0-10 Pain Score: 6  Pain Descriptors / Indicators: Aching, Discomfort    Home Living Family/patient expects to be discharged to:: Private residence Living Arrangements: Alone Available Help at Discharge: Other (Comment) (son lives nearby but works so can only check occasionally) Type of Home: Mobile home Home Access: Stairs to enter Entrance Stairs-Rails: Lawyer of Steps: 3-4   Home Layout: One level Home Equipment: Agricultural consultant (2 wheels);Rollator (4 wheels);Toilet riser;Shower seat Additional Comments: used RW in the house and rollator in the community    Prior Function Prior Level of Function : Independent/Modified Independent             Mobility Comments: Used RW in the house and rollator outside of the house ADLs Comments: son helps some with getting her dinner     Hand Dominance   Dominant Hand: Left    Extremity/Trunk Assessment   Upper Extremity Assessment Upper Extremity Assessment: Defer to OT evaluation    Lower Extremity Assessment Lower Extremity Assessment: Generalized weakness    Cervical / Trunk Assessment Cervical / Trunk Assessment: Back Surgery (rounded and hiked shoulders. Able to improve with verbal cues.)  Communication   Communication: No difficulties  Cognition Arousal/Alertness: Awake/alert Behavior During Therapy: WFL for tasks assessed/performed Overall Cognitive Status: Within Functional Limits for tasks assessed                 Assessment/Plan    PT Assessment Patient needs continued PT services  PT Problem List Decreased  strength;Decreased balance;Pain;Decreased activity tolerance;Decreased safety awareness;Decreased mobility       PT Treatment Interventions DME instruction;Functional mobility training;Balance training;Patient/family education;Gait training;Therapeutic activities;Neuromuscular re-education;Stair training;Therapeutic exercise;Manual techniques    PT Goals (Current goals can be found in the Care Plan section)  Acute Rehab PT Goals Patient Stated Goal: To get stronger PT Goal Formulation: With patient Time For Goal Achievement: 12/16/22 Potential to Achieve Goals: Fair    Frequency Min 5X/week        AM-PAC PT "6 Clicks" Mobility  Outcome Measure Help needed turning from your back to your side while in a flat bed without using bedrails?: A Little Help needed moving from lying on your back to sitting on the side of a flat bed without using bedrails?: A Little Help needed moving to and from a bed to a chair (including a wheelchair)?: A Little Help needed standing up from a chair using your arms (e.g., wheelchair or bedside chair)?: A Little Help needed to walk in hospital room?: A Little Help needed climbing 3-5 steps with a railing? : A Lot 6 Click Score: 17    End of Session Equipment Utilized During Treatment: Gait belt Activity Tolerance: Patient limited by fatigue Patient left: in chair;with family/visitor present;with call bell/phone within reach Nurse Communication: Mobility status PT Visit Diagnosis: Unsteadiness on feet (R26.81);Other abnormalities of gait and mobility (R26.89);Muscle weakness (generalized) (M62.81)    Time: 7412-8786 PT Time Calculation (min) (  ACUTE ONLY): 25 min   Charges:   PT Evaluation $PT Eval Low Complexity: 1 Low PT Treatments $Therapeutic Activity: 8-22 mins       Harrel Carina, DPT, CLT  Acute Rehabilitation Services Office: [phone] (Secure chat preferred)   Claudia Desanctis 12/02/2022, 12:03 PM

## 2022-12-02 NOTE — Progress Notes (Signed)
Patient ID: Cynthia [name], female   DOB: [birth year], 61 y.o.   MRN: [id number] BP (!) 148/76 (BP Location: Left Wrist)   Pulse 92   Temp 98.2 F (36.8 C) (Oral)   Resp 18   Ht 5\' 3"  (1.6 m)   Wt 115 kg   LMP  (LMP Unknown)   SpO2 99%   BMI 44.91 kg/m  Alert and oriented x 4, speech is clear and fluent Moving all extremities Wound is clean, dry, and without signs of infection Working with PT/OT Significant improvement from preop Continue with PT

## 2022-12-02 NOTE — Evaluation (Signed)
Occupational Therapy Evaluation Patient Details Name: Cynthia [name] MRN: [id number] DOB: 09-12-1961 Today's Date: 12/02/2022   History of Present Illness Cynthia [name] is a 61 y.o. female  With a history of neck pain, left upper extremity pain along with pain in the left chest, and weakness in the left lower extremity.  She underwent T1-T2 decompression via Dr Christella Noa on 12/01/22.   Clinical Impression   Pt currently min to min guard assist for transfers with use of the RW to the elevated toilet with rail.  Min assist or greater for LB selfcare sit to stand without AE.  Will benefit from acute care OT to increase independence back to modified independent/supervision level for home.  Pt may only be able to arrange short term supervision from her son for a day or two.  If progress is slow, pt may benefit from short term SNF for follow-up as she will be alone.       Recommendations for follow up therapy are one component of a multi-disciplinary discharge planning process, led by the attending physician.  Recommendations may be updated based on patient status, additional functional criteria and insurance authorization.   Follow Up Recommendations  Home health OT     Assistance Recommended at Discharge PRN  Patient can return home with the following A little help with bathing/dressing/bathroom;A little help with walking and/or transfers;Assistance with cooking/housework;Assist for transportation;Help with stairs or ramp for entrance    Functional Status Assessment  Patient has had a recent decline in their functional status and demonstrates the ability to make significant improvements in function in a reasonable and predictable amount of time.  Equipment Recommendations  None recommended by OT       Precautions / Restrictions Precautions Precautions: Fall;Cervical Precaution Booklet Issued: No Restrictions Weight Bearing Restrictions: No      Mobility Bed Mobility Overal bed  mobility: Needs Assistance Bed Mobility: Rolling, Supine to Sit Rolling: Min assist   Supine to sit: Min assist          Transfers Overall transfer level: Needs assistance Equipment used: Rolling walker (2 wheels) Transfers: Sit to/from Stand, Bed to chair/wheelchair/BSC Sit to Stand: Min guard     Step pivot transfers: Min guard     General transfer comment: min guard for mobility to the bathroom with use of the RW for support.  Min instructional cueing for hand placement during transfer.      Balance Overall balance assessment: Needs assistance Sitting-balance support: Bilateral upper extremity supported Sitting balance-Leahy Scale: Fair     Standing balance support: Bilateral upper extremity supported, During functional activity Standing balance-Leahy Scale: Fair Standing balance comment: Able to stand and wash hands withouit support.                           ADL either performed or assessed with clinical judgement   ADL Overall ADL's : Needs assistance/impaired Eating/Feeding: Independent;Sitting   Grooming: Wash/dry hands;Wash/dry face;Min guard;Standing   Upper Body Bathing: Supervision/ safety;Sitting Upper Body Bathing Details (indicate cue type and reason): simulated Lower Body Bathing: Minimal assistance;Sit to/from stand Lower Body Bathing Details (indicate cue type and reason): simulated Upper Body Dressing : Minimal assistance;Sitting Upper Body Dressing Details (indicate cue type and reason): simulated Lower Body Dressing: Moderate assistance;Sit to/from stand Lower Body Dressing Details (indicate cue type and reason): without AE, min guard to donn gripper socks with use of the sockaide Toilet Transfer: Min guard;Comfort height toilet;Ambulation;Grab bars  Toileting- Clothing Manipulation and Hygiene: Minimal assistance;Sit to/from stand       Functional mobility during ADLs: Minimal assistance;Min guard;Rolling walker (2 wheels)  (functional transfers with use of the RW for support) General ADL Comments: Pt's O2 at 97% or greater on room air.  Discussed use of shower seat vs tub bench.  Pt will stick with her shower seat and will likely wait until she can step over in the tub and use it.  She has a RW, rollator, and toilet riser at home.  Her son works but lives right behind her.  She is going to see if he can take a day or two off after initial discharge to help at home.  If not, may need short term SNF rehab depending on pain.     Vision Baseline Vision/History: 1 Wears glasses Ability to See in Adequate Light: 0 Adequate Patient Visual Report: No change from baseline Vision Assessment?: No apparent visual deficits     Perception  WFL   Praxis  Safety [facility]    Pertinent Vitals/Pain Pain Assessment Pain Assessment: 0-10 Pain Score: 6  Pain Location: upper back and shoulders Pain Descriptors / Indicators: Aching, Discomfort Pain Intervention(s): Limited activity within patient's tolerance, Repositioned     Hand Dominance Left   Extremity/Trunk Assessment Upper Extremity Assessment Upper Extremity Assessment: Generalized weakness (grip strength WFLs bilaterally.)   Lower Extremity Assessment Lower Extremity Assessment: Defer to PT evaluation   Cervical / Trunk Assessment Cervical / Trunk Assessment: Back Surgery (rounded shoulders)   Communication Communication Communication: No difficulties   Cognition Arousal/Alertness: Awake/alert Behavior During Therapy: WFL for tasks assessed/performed Overall Cognitive Status: Within Functional Limits for tasks assessed                                                  Home Living Family/patient expects to be discharged to:: Private residence Living Arrangements: Alone Available Help at Discharge:  (son lives nearby and works, so only can check in infrequently) Type of Home: Mobile home Home Access: Stairs to enter Secretary/administrator of  Steps: 3-4 Entrance Stairs-Rails: Left;Right Home Layout: One level     Bathroom Shower/Tub: Chief Strategy Officer: Standard Bathroom Accessibility: No   Home Equipment: Agricultural consultant (2 wheels);Rollator (4 wheels);Toilet riser;Shower seat   Additional Comments: used RW in the house and rollator in the community      Prior Functioning/Environment Prior Level of Function : Independent/Modified Independent             Mobility Comments: Used RW in the house and rollator outside of the house ADLs Comments: son helps some with getting her dinner        OT Problem List: Decreased strength;Decreased activity tolerance;Impaired balance (sitting and/or standing);Decreased knowledge of use of DME or AE;Pain;Decreased safety awareness;Decreased cognition      OT Treatment/Interventions: Self-care/ADL training;Patient/family education;Balance training;Neuromuscular education;Therapeutic activities;DME and/or AE instruction;Cognitive remediation/compensation;Therapeutic exercise    OT Goals(Current goals can be found in the care plan section) Acute Rehab OT Goals Patient Stated Goal: Pt wants to be able to walk better OT Goal Formulation: With patient Time For Goal Achievement: 12/16/22 Potential to Achieve Goals: Good  OT Frequency: Min 2X/week       AM-PAC OT "6 Clicks" Daily Activity     Outcome Measure Help from another person eating meals?: None Help from  another person taking care of personal grooming?: A Little Help from another person toileting, which includes using toliet, bedpan, or urinal?: A Little Help from another person bathing (including washing, rinsing, drying)?: A Little Help from another person to put on and taking off regular upper body clothing?: A Little Help from another person to put on and taking off regular lower body clothing?: A Lot 6 Click Score: 18   End of Session Equipment Utilized During Treatment: Gait belt;Rolling walker (2  wheels) Nurse Communication: Mobility status  Activity Tolerance: Patient tolerated treatment well Patient left: with call bell/phone within reach;in chair  OT Visit Diagnosis: Unsteadiness on feet (R26.81);Muscle weakness (generalized) (M62.81);Pain Pain - part of body:  (back)                Time: [id number] OT Time Calculation (min): 50 min Charges:  OT General Charges $OT Visit: 1 Visit OT Evaluation $OT Eval Moderate Complexity: 1 Mod OT Treatments $Self Care/Home Management : 23-37 mins Monicia Tse OTR/L 12/02/2022, 10:32 AM

## 2022-12-03 LAB — SURGICAL PATHOLOGY

## 2022-12-03 MED ORDER — TIZANIDINE HCL 4 MG PO TABS: 4.0000 mg | ORAL_TABLET | Freq: Four times a day (QID) | ORAL | 0 refills | Status: DC | PRN

## 2022-12-03 NOTE — TOC Transition Note (Signed)
Transition of Care [facility]) - CM/SW Discharge Note   Patient Details  Name: Cynthia [name] MRN: [id number] Date of Birth: Dec 26, 1960  Transition of Care [facility]) CM/SW Contact:  Epifanio Lesches, RN Phone Number: 12/03/2022, 11:19 AM   Clinical Narrative:    Patient will DC to: home Anticipated DC date: 12/03/2022 Family notified: yes, son Transport by: car       -  s/p T1-T2 decompression,12/01/22   Per MD patient ready for DC today . RN, patient, and patient's family aware of DC.  Per PT'S recommendation, pt agreeable to home health PT services. Pt without provider preference as long as in network with insurance. Referral made with [facility] via voice message and text with Amy, acceptance pending. NCM paged and called provider's office for home health orders. Awaiting response. Pt already has RW @ home. States son to assist with care once d/c. Pt without RX med concerns. Post hospital f/u noted on AVS. Son to provide transportation to home .  RNCM will sign off for now as intervention is no longer needed. Please consult [place] again if new needs arise.    Final next level of care: Home w Home Health Services Barriers to Discharge: No Barriers Identified   Patient Goals and CMS Choice   Choice offered to / list presented to : Patient  Discharge Placement                         Discharge Plan and Services Additional resources added to the After Visit Summary for   In-house Referral: Clinical Social Work   Post Acute Care Choice: Home Health                    HH Arranged: PT [facility] Agency: [facility] Date [facility] Agency Contacted: 12/03/22 Time HH Agency Contacted: [phone] Representative spoke with at [facility] Agency: Amy (voicemessage and text)  Social Determinants of Health (SDOH) Interventions SDOH Screenings   Food Insecurity: No Food Insecurity (08/18/2021)  Housing: Low Risk  (08/18/2021)  Transportation Needs: No Transportation Needs (08/18/2021)   Utilities: Not At Risk (08/15/2022)  Alcohol Screen: Low Risk  (08/29/2020)  Depression (PHQ2-9): Low Risk  (08/27/2022)  Financial Resource Strain: Low Risk  (08/18/2021)  Physical Activity: Inactive (08/18/2021)  Social Connections: Socially Isolated (08/18/2021)  Stress: No Stress Concern Present (08/18/2021)  Tobacco Use: Low Risk  (12/02/2022)     Readmission Risk Interventions     No data to display

## 2022-12-03 NOTE — Progress Notes (Signed)
Physical Therapy Treatment Patient Details Name: Cynthia [name] MRN: [id number] DOB: 10-12-1961 Today's Date: 12/03/2022   History of Present Illness Cynthia [name] is a 61 y.o. female  With a history of neck pain, left upper extremity pain along with pain in the left chest, and weakness in the left lower extremity.  She underwent T1-T2 decompression via Dr Franky Macho on 12/01/22.    PT Comments    Pt has improved significantly from initial evaluation. Pt was able to improve sit to stand and gait distance. Pt navigated 2 steps with home set up at close SBA which pt states she will have the assistance at home. Today pt states that her son has flexible hours and will be able to assist some. Pt was educated on importance of mobility after surgery with getting up to move every hour while awake and pt stated understanding. Currently pt will benefit from continued skilled physical therapy services in HHPT setting on discharge from acute care hospital setting in order to return to PLOF with intermittent supervision and assist from family. Pt demonstrates no signs/symptoms of cardiac/respiratory distress throughout session.   Recommendations for follow up therapy are one component of a multi-disciplinary discharge planning process, led by the attending physician.  Recommendations may be updated based on patient status, additional functional criteria and insurance authorization.  Follow Up Recommendations  Home health PT Can patient physically be transported by private vehicle: Yes   Assistance Recommended at Discharge Intermittent Supervision/Assistance  Patient can return home with the following A little help with walking and/or transfers;Help with stairs or ramp for entrance;Assistance with cooking/housework   Equipment Recommendations  Rolling walker (2 wheels)    Recommendations for Other Services       Precautions / Restrictions Precautions Precautions: Fall;Cervical Precaution Booklet  Issued: Yes (comment) Precaution Comments: Re-iterated precautions Restrictions Weight Bearing Restrictions: No     Mobility  Bed Mobility Overal bed mobility: Needs Assistance             General bed mobility comments: Pt was recieved sitting EOB and returned to recliner. pt sleeps on sofa propped up at home or in recliner due to sleep apnea. Pt states that she has had supervision but no physical assistance for getting in/out of the hospital bed. Patient Response: Cooperative  Transfers Overall transfer level: Needs assistance Equipment used: Rolling walker (2 wheels) Transfers: Sit to/from Stand, Bed to chair/wheelchair/BSC Sit to Stand: Supervision   Step pivot transfers: Supervision       General transfer comment: Pt requires increased time but good hand placement today. Slight sway on first standing.    Ambulation/Gait Ambulation/Gait assistance: Supervision Gait Distance (Feet): 100 Feet (2x) Assistive device: Rolling walker (2 wheels) Gait Pattern/deviations: Step-through pattern, Decreased step length - right, Decreased step length - left, Wide base of support, Decreased dorsiflexion - left   Gait velocity interpretation: <1.8 ft/sec, indicate of risk for recurrent falls   General Gait Details: Improved wgt shifting today. Pt continues with slow cadence. Improved endurance today.   Stairs Stairs: Yes Stairs assistance: Min guard Stair Management: Two rails, Step to pattern Number of Stairs: 2 General stair comments: Pt should have someone close by when ascending/descending stairs per home set up but was able to perform 2 steps in gym with close SBA and verbal cues with frequent re-iteration throughout session for correct sequencing.        Balance Overall balance assessment: Needs assistance Sitting-balance support: Single extremity supported, No upper extremity supported Sitting balance-Leahy  Scale: Good     Standing balance support: Bilateral upper  extremity supported, During functional activity Standing balance-Leahy Scale: Fair Standing balance comment: Able to reach for phone on bed without LOB              Cognition Arousal/Alertness: Awake/alert Behavior During Therapy: WFL for tasks assessed/performed Overall Cognitive Status: Within Functional Limits for tasks assessed                 General Comments General comments (skin integrity, edema, etc.): Pt has improved significantly from initial evaluation with mobility and endurance.      Pertinent Vitals/Pain Pain Assessment Pain Assessment: No/denies pain     PT Goals (current goals can now be found in the care plan section) Acute Rehab PT Goals Patient Stated Goal: To get stronger PT Goal Formulation: With patient Time For Goal Achievement: 12/16/22 Potential to Achieve Goals: Fair Progress towards PT goals: Progressing toward goals    Frequency    Min 5X/week      PT Plan Other (comment) (HHPT)       AM-PAC PT "6 Clicks" Mobility   Outcome Measure  Help needed turning from your back to your side while in a flat bed without using bedrails?: A Little Help needed moving from lying on your back to sitting on the side of a flat bed without using bedrails?: A Little Help needed moving to and from a bed to a chair (including a wheelchair)?: A Little Help needed standing up from a chair using your arms (e.g., wheelchair or bedside chair)?: A Little Help needed to walk in hospital room?: A Little Help needed climbing 3-5 steps with a railing? : A Little 6 Click Score: 18    End of Session Equipment Utilized During Treatment: Gait belt Activity Tolerance: Patient tolerated treatment well Patient left: in chair;with call bell/phone within reach;with chair alarm set Nurse Communication: Mobility status PT Visit Diagnosis: Unsteadiness on feet (R26.81);Other abnormalities of gait and mobility (R26.89);Muscle weakness (generalized) (M62.81)      Time: [id number] PT Time Calculation (min) (ACUTE ONLY): 23 min  Charges:  $Gait Training: 8-22 mins $Therapeutic Activity: 8-22 mins                     Tomma Rakers, DPT, [place] Office: [phone] (Secure chat preferred)    Ander Purpura 12/03/2022, 9:42 AM

## 2022-12-03 NOTE — TOC Initial Note (Signed)
Transition of Care [facility]) - Initial/Assessment Note    Patient Details  Name: Cynthia [name] MRN: [id number] Date of Birth: 09/02/1961  [facility]) CM/SW Contact:    Cynthia Chars, LCSW Phone Number: 12/03/2022, 11:15 AM  Clinical Narrative:     CSW met with pt for initial assessment, discussed PT recommendations changing from SNF to [facility] after PT session today.  Pt aware and does want to DC home. Pt lives alone but her son lives behind her and is available to assist outside of work hours.  Daughter Cynthia [name] is main contact, permission given to speak with both of them.  Pt has walker at home, no other DME needs identified.  Discussed that RN CM will follow up for [facility] services, no agency preference indicated outside of [facility] that can accept [employer].  RNCM notified.              Expected Discharge Plan: [place] Barriers to Discharge: No Barriers Identified   Patient Goals and CMS Choice Patient states their goals for this hospitalization and ongoing recovery are:: get back to work          Expected Discharge Plan and Services In-house Referral: Clinical Social Work   Post Acute Care Choice: [place] arrangements for the past 2 months: Single Family Home Expected Discharge Date: 12/03/22                                    Prior Living Arrangements/Services Living arrangements for the past 2 months: Single Family Home Lives with:: Self Patient language and need for interpreter reviewed:: Yes Do you feel safe going back to the place where you live?: Yes      Need for Family Participation in Patient Care: No (Comment) Care giver support system in place?: Yes (comment) Current home services: Other (comment) (none) Criminal Activity/Legal Involvement Pertinent to Current Situation/Hospitalization: No - Comment as needed  Activities of Daily Living      Permission Sought/Granted Permission sought to share information with :  Family Supports Permission granted to share information with : Yes, Verbal Permission Granted  Share Information with NAME: daughter Cynthia [name], son Cynthia [name]           Emotional Assessment Appearance:: Appears stated age Attitude/Demeanor/Rapport: Engaged Affect (typically observed): Appropriate, Pleasant Orientation: : Oriented to Self, Oriented to Place, Oriented to  Time, Oriented to Situation      Admission diagnosis:  Spinal cord compression (HCC) [G95.20] Synovial cyst [M71.30] Status post surgery [Z98.890] Thoracic spinal stenosis [M48.04] Patient Active Problem List   Diagnosis Date Noted   Status post surgery 12/01/2022   Thoracic spinal stenosis 12/01/2022   Cervical radiculopathy 11/05/2022   Ataxia 11/05/2022   Proximal leg weakness 11/05/2022   Other chest pain 11/05/2022   Paresthesia 11/05/2022   Elevated BP without diagnosis of hypertension 08/27/2022   Chronic left-sided thoracic back pain 08/27/2022   Neck pain 07/21/2022   Lumbar back pain 07/21/2022   Joint pain 06/01/2022   Prediabetes 11/22/2021   Bipolar I disorder, most recent episode (or current) depressed ([place]) 08/27/2020   Mixed hyperlipidemia 02/20/2020   Gastroesophageal reflux disease without esophagitis 02/20/2020   Hearing loss 07/03/2019   Mild persistent asthma without complication 27/74/1287   Diverticulosis 03/09/2015   PCP:  Cynthia Brome, MD Pharmacy:   [facility] [place], [place]  [facility] [address] [place] [address] Phone: [phone] Fax: [phone]  [place]), [place] - [address] [place] ([place]) [place] [address] Phone: [phone] Fax: [phone]     Social Determinants of Health (SDOH) Social History: [place]: No Food Insecurity (08/18/2021)  Housing: Low Risk  (08/18/2021)  Transportation Needs: No  Transportation Needs (08/18/2021)  Utilities: Not At Risk (08/15/2022)  Alcohol Screen: Low Risk  (08/29/2020)  Depression (PHQ2-9): Low Risk  (08/27/2022)  Financial Resource Strain: Low Risk  (08/18/2021)  Physical Activity: Inactive (08/18/2021)  Social Connections: Socially Isolated (08/18/2021)  Stress: No Stress Concern Present (08/18/2021)  Tobacco Use: Low Risk  (12/02/2022)   SDOH Interventions:     Readmission Risk Interventions     No data to display

## 2022-12-04 ENCOUNTER — Ambulatory Visit (HOSPITAL_COMMUNITY): Admission: RE | Admit: 2022-12-04 | Payer: Medicare HMO | Source: Home / Self Care | Admitting: Neurological Surgery

## 2022-12-04 ENCOUNTER — Encounter (HOSPITAL_COMMUNITY): Admission: RE | Payer: Self-pay | Source: Home / Self Care

## 2022-12-04 SURGERY — LUMBAR LAMINECTOMY/DECOMPRESSION MICRODISCECTOMY 1 LEVEL
Anesthesia: General

## 2022-12-05 ENCOUNTER — Encounter: Payer: Self-pay | Admitting: *Deleted

## 2022-12-05 ENCOUNTER — Telehealth: Payer: Self-pay | Admitting: *Deleted

## 2022-12-05 NOTE — Patient Outreach (Signed)
  Care Coordination   Initial Visit Note   12/05/2022 Name: Cynthia [name] MRN: [id number] DOB: 11-08-1961  Cynthia [name] is a 61 y.o. year old female who sees Cox, Kirsten, MD for primary care. I  spoke with Amy [facility] [phone]) in care coordination post- TOC call earlier today  What matters to the patients health and wellness today?  Amy at [facility] returned my call from earlier today and confirmed that original home health referral during hospitalization was accepted as "pending;" patient was discharged home prior to actual home health order being completed-- Amy states that she will contact the neurosurgeon now to obtain orders and reports that once order is received from neurosurgery, services can start next week on 12/09/22 or 12/10/22.  Amy confirms she will work with neurosurgery now/ today to obtain order and will contact patient to schedule home visit once she has order from neurosurgery group    SDOH assessments and interventions completed:  No Previously completed this morning with TOC call   Care Coordination Interventions:  Yes, provided   Follow up plan: Follow up call scheduled for 12/10/22 with RN CM Care Coordinator    Encounter Outcome:  Pt. Visit Completed   Caryl Pina, RN, BSN, CCRN Alumnus RN CM Care Coordination/ Transition of Care- [facility] Care Management [phone]: direct office

## 2022-12-05 NOTE — Patient Outreach (Signed)
Care Coordination [facility] Note Transition Care Management Follow-up Telephone Call Date of discharge and from where: Thursday, 12/03/22 Cynthia [name]; thoracic spinal stenosis with surgery How have you been since you were released from the hospital? "Its been rough; I am having a hard time moving around, but I am taking my time and doing the best I can and using my walker.  My son and daughter are in and out but overall, I am on own and not doing so great, very uncomfortable.  I do not have an appointment set up with the surgeon yet and I don't have any specific discharge instructions to refer to.  I am taking the medicine he gave me and that helps a little bit, but I am just very uncomfortable all the time since I got home.  I have not heard from the home health PT people yet.  I have no specific instructions about what I can do as far as activity goes, if it is okay to drive, to take a shower, I have no instructions at all to go by" Any questions or concerns? Yes 1) no specific discharge instructions around post-op activities/ recommendations and 2) has not heard from neurosurgery provider to scheduled post-op appointment or to receive clarification around specific post-op instructions  Care Coordination Activities: -- contacted [facility] health [phone]) spoke with Konrad Felix- she reports there is no referral for patient and suggests I call Amy directly at [phone] since inpatient referral was directed to Amy- called Amy and left voice message requesting call back -- reviewed post-hospital discharge AVS with patient and verified no specific post-op discharge instructions included: provided/ confirmed neurosurgery provider phone number and encouraged patient to contact provider as soon as we hang up to scheduled post- surgery provider appointment and clarification around specific post-op instructions such as bathing, activity, driving, etc -- followed up with patient to update-- explained that I  contacted [facility] home [facility] and requested follow up to myself and to patient; patient stated she called [facility] and was told that someone would call her later to scheduled appointment/ provide specific discharge instructions -- contacted [facility] [phone]); spoke with Harrie Foreman and made her aware that patient did not receive specific hospital discharge instructions, needs follow up appointment scheduled, and has not received follow up around home health PT referral made on 12/03/22: Harrie Foreman agreed to escalate and have someone at office contact patient today -- scheduled follow up with RN CM Care Coordinator next week to follow up on all of above  Items Reviewed: Did the pt receive and understand the discharge instructions provided?  Patient verbalizes many questions about specific post-surgery instructions for care at home  Medications obtained and verified? Yes  reviewed/ confirmed patient obtained newly prescribed Zanaflex: she declined full medication review, does not want to get up and get medicines, she is lying down and is in discomfort Other? No  Any new allergies since your discharge? No  Dietary orders reviewed? No Do you have support at home? Yes  patient reports she is normally independent in all aspects of self-care; adult son and daughter assisting as needed/ indicated post- recent surgery  Home Care and Equipment/Supplies: Were home health services ordered? yes If so, what is the name of the agency? [facility]  Has the agency set up a time to come to the patient's home? No- care coordination outreach to facilitate initiation of services as above Were any new equipment or medical supplies ordered?  No What is the name of the medical  supply agency? N/A Were you able to get the supplies/equipment? not applicable Do you have any questions related to the use of the equipment or supplies? No  Functional Questionnaire: (I = Independent and D =  Dependent) ADLs: I  adult son and daughter assisting as needed  Bathing/Dressing- I  adult son and daughter assisting as needed  Meal Prep- I  adult son and daughter assisting as needed  Eating- I  Maintaining continence- I  adult son and daughter assisting as needed  Transferring/Ambulation- I  adult son and daughter assisting as needed  Managing Meds- I  Follow up appointments reviewed:  PCP Hospital f/u appt confirmed? No  Scheduled to see - on - @ [facility] f/u appt confirmed? No  Scheduled to see - on - @ - see care coordination interventions as above Are transportation arrangements needed? No  If their condition worsens, is the pt aware to call PCP or go to the Emergency Dept.? Yes Was the patient provided with contact information for the PCP's office or ED? No patient declined- reports already has contact information for care providers Was to pt encouraged to call back with questions or concerns? Yes- provided my direct contact information should questions/ concerns/ needs arise in the future  SDOH assessments and interventions completed:   Yes SDOH Interventions Today    Flowsheet Row Most Recent Value  SDOH Interventions   Food Insecurity Interventions Intervention Not Indicated  Transportation Interventions Intervention Not Indicated  [Normally drives self-- has questions about driving instructions post-op,  states family will "probably" be able to assist with transportation if needed due to driving restrictions post-op]      Care Coordination Interventions:  Referred for Care Coordination Services:  RN Care Coordinator Care coordination outreach to home health agency x 2 and to neurosurgeon; education provided around process to address urgent concerns over holiday weekend     Encounter Outcome:  Pt. Visit Completed    Caryl Pina, RN, BSN, CCRN Alumnus RN CM Care Coordination/ Transition of Care- [facility] Care Management [phone]: direct  office

## 2022-12-10 ENCOUNTER — Ambulatory Visit: Payer: Self-pay

## 2022-12-10 NOTE — Patient Outreach (Addendum)
  Care Coordination   Follow Up Visit Note   12/10/2022 Name: Cynthia [name] MRN: [id number] DOB: 1961-04-04  Cynthia [name] is a 62 y.o. year old female who sees Cox, Kirsten, MD for primary care. I spoke with  Cynthia [name] by phone today.  What matters to the patients health and wellness today?  Patient reports that she has not heard from [facility].  Patient has questions about bathing and dressing changes.     Goals Addressed               This Visit's Progress     Discharge instuction questions (pt-stated)        Care Coordination Interventions: Reviewed current situation with patient.  No home health. Questions about bathing and dressing changes I reviewed AVS and was not able to answer question as no information was on the AVS. I have placed call to Amy with [facility] home [facility] and left a message Encouraged patient to call Neurosurgery with her questions about bathing and dressing changes.  Will plan to follow up with patient again after I hear back from home health. 330pm    2nd call place to [facility] and left an additional message.  Called patient back to provide an update.                                                                                                                                                                                                                                                       SDOH assessments and interventions completed:  No     Care Coordination Interventions:  Yes, provided   Follow up plan:  later today    Encounter Outcome:  Pt. Visit Completed   Tomasa Rand, RN, BSN, CEN [place] Coordinator [phone]

## 2022-12-12 ENCOUNTER — Telehealth: Payer: Self-pay

## 2022-12-12 NOTE — Patient Outreach (Signed)
  Care Coordination   Follow Up Visit Note   12/12/2022 Name: KADIJA CRUZEN MRN: [id number] DOB: [birth year]  Lucas Mallow Curci is a 62 y.o. year old female who sees Cox, Kirsten, MD for primary care. I spoke with  Collene Mares by phone today.  What matters to the patients health and wellness today?  Care coordination    Goals Addressed               This Visit's Progress     Discharge instuction questions (pt-stated)        Care Coordination Interventions: Placed call to patient today and reviewed that she did speak to MD office about bathing and dressing changes. Placed call to Nolberto Hanlon again with [facility] and left another voicemail.   No response to previous voicemails Spoke with Marcie at [facility] and I was informed order was received today and patient is scheduled for start of care on 12/16/2022. Called patient back to confirm that she was aware of pending start of care. Patient reports that she still does not have a follow up appointment with MD office. Offered to call to assist with scheduling and patient reports she has an incoming call from MD office right now. I requested patient call me back if she is not able to get an appointment. Planned follow up on 12/17/2022                                                                                                                                                                                                                                                       SDOH assessments and interventions completed:  No     Care Coordination Interventions:  Yes, provided   Follow up plan: Follow up call scheduled for 12/17/2022    Encounter Outcome:  Pt. Visit Completed   Tomasa Rand, RN, BSN, CEN [place] Coordinator [phone]

## 2022-12-13 ENCOUNTER — Other Ambulatory Visit: Payer: Self-pay | Admitting: Family Medicine

## 2022-12-13 DIAGNOSIS — I10 Essential (primary) hypertension: Secondary | ICD-10-CM

## 2022-12-14 DIAGNOSIS — Z6841 Body Mass Index (BMI) 40.0 and over, adult: Secondary | ICD-10-CM | POA: Diagnosis not present

## 2022-12-14 DIAGNOSIS — E669 Obesity, unspecified: Secondary | ICD-10-CM | POA: Diagnosis not present

## 2022-12-14 DIAGNOSIS — Z4789 Encounter for other orthopedic aftercare: Secondary | ICD-10-CM | POA: Diagnosis not present

## 2022-12-16 DIAGNOSIS — E669 Obesity, unspecified: Secondary | ICD-10-CM | POA: Diagnosis not present

## 2022-12-16 DIAGNOSIS — Z6841 Body Mass Index (BMI) 40.0 and over, adult: Secondary | ICD-10-CM | POA: Diagnosis not present

## 2022-12-16 DIAGNOSIS — Z4789 Encounter for other orthopedic aftercare: Secondary | ICD-10-CM | POA: Diagnosis not present

## 2022-12-17 ENCOUNTER — Ambulatory Visit: Payer: Self-pay

## 2022-12-17 NOTE — Patient Outreach (Signed)
  Care Coordination   Follow Up Visit Note   12/17/2022 Name: Cynthia [name] MRN: [id number] DOB: [birth year]  Cynthia [name] is a 62 y.o. year old female who sees Cox, Kirsten, MD for primary care. I spoke with  Collene Mares by phone today.  What matters to the patients health and wellness today?  Placed call to  patient who reports that home health did come out. Patient reports that she is doing well.  Denies any additional needs.     SDOH assessments and interventions completed:  No     Care Coordination Interventions:  Yes, provided   Follow up plan: No further intervention required.   Encounter Outcome:  Pt. Visit Completed   Tomasa Rand, RN, BSN, CEN [place] Coordinator [phone]

## 2022-12-19 DIAGNOSIS — E669 Obesity, unspecified: Secondary | ICD-10-CM | POA: Diagnosis not present

## 2022-12-19 DIAGNOSIS — Z4789 Encounter for other orthopedic aftercare: Secondary | ICD-10-CM | POA: Diagnosis not present

## 2022-12-19 DIAGNOSIS — Z6841 Body Mass Index (BMI) 40.0 and over, adult: Secondary | ICD-10-CM | POA: Diagnosis not present

## 2022-12-19 NOTE — Discharge Summary (Signed)
Physician Discharge Summary  Patient ID: Cynthia [name] MRN: [id number] DOB/AGE: 01-01-1961 62 y.o.  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 Post op he is voiding, ambulating, and tolerating a regular diet. Her wound is clean, dry, without signs of infection. She will be discharged to her home.   Treatments: surgery: THORACIC ONE, THORACIC TWO LAMINECTOMY/DECOMPRESSION FOR SYNOVIAL CYST RESECTION   Discharge Exam: Blood pressure 129/70, pulse 86, temperature 97.6 F (36.4 C), temperature source Oral, resp. rate 18, height 5\' 3"  (1.6 m), weight 115 kg, SpO2 97 %. General appearance: alert, cooperative, appears stated age, and mild distress  Disposition: Discharge disposition: 01-Home or Self Care      SYNOVIAL CYST T1-T2  Allergies as of 12/03/2022   No Known Allergies      Medication List     TAKE these medications    albuterol 108 (90 Base) MCG/ACT inhaler Commonly known as: VENTOLIN HFA Inhale 1-2 puffs into the lungs every 4 (four) hours as needed for wheezing or shortness of breath.   Breztri Aerosphere 160-9-4.8 MCG/ACT Aero Generic drug: Budeson-Glycopyrrol-Formoterol Inhale 2 puffs into the lungs 2 (two) times daily as needed. What changed: reasons to take this   buPROPion 150 MG 24 hr tablet Commonly known as: WELLBUTRIN XL Take 1 tablet (150 mg total) by mouth daily. For depression   cholecalciferol 25 MCG (1000 UNIT) tablet Commonly known as: VITAMIN D3 Take 1,000 Units by mouth daily.   divalproex 500 MG DR tablet Commonly known as: DEPAKOTE Take 1 tablet (500 mg  total) by mouth every 12 (twelve) hours. For mood stabilization What changed: when to take this   ferrous gluconate 324 MG tablet Commonly known as: FERGON Take 324 mg by mouth daily.   HYDROcodone-acetaminophen 5-325 MG tablet Commonly known as: NORCO/VICODIN Take 1 tablet by mouth every 4 (four) hours as needed for severe pain (OSteoarthritis of lumbar spine.).   rosuvastatin 20 MG tablet Commonly known as: CRESTOR Take 20 mg by mouth daily.   tiZANidine 4 MG tablet Commonly known as: ZANAFLEX Take 1 tablet (4 mg total) by mouth every 6 (six) hours as needed for muscle spasms.        Follow-up Information     [facility], Elnita Maxwell, MD Follow up.   Specialty: Family Medicine Contact information: [address] [place] [address] [place], [facility] Follow up.   Specialty: Neurosurgery Contact information: [address] [place] [address] [phone]                 Signed: Ashok Pall 12/19/2022, 4:27 PM

## 2022-12-22 DIAGNOSIS — Z4789 Encounter for other orthopedic aftercare: Secondary | ICD-10-CM | POA: Diagnosis not present

## 2022-12-22 DIAGNOSIS — Z6841 Body Mass Index (BMI) 40.0 and over, adult: Secondary | ICD-10-CM | POA: Diagnosis not present

## 2022-12-22 DIAGNOSIS — E669 Obesity, unspecified: Secondary | ICD-10-CM | POA: Diagnosis not present

## 2022-12-24 DIAGNOSIS — Z4789 Encounter for other orthopedic aftercare: Secondary | ICD-10-CM | POA: Diagnosis not present

## 2022-12-24 DIAGNOSIS — Z6841 Body Mass Index (BMI) 40.0 and over, adult: Secondary | ICD-10-CM | POA: Diagnosis not present

## 2022-12-24 DIAGNOSIS — E669 Obesity, unspecified: Secondary | ICD-10-CM | POA: Diagnosis not present

## 2022-12-30 ENCOUNTER — Inpatient Hospital Stay: Payer: Medicare HMO | Admitting: Family Medicine

## 2022-12-31 DIAGNOSIS — Z6841 Body Mass Index (BMI) 40.0 and over, adult: Secondary | ICD-10-CM | POA: Diagnosis not present

## 2022-12-31 DIAGNOSIS — Z4789 Encounter for other orthopedic aftercare: Secondary | ICD-10-CM | POA: Diagnosis not present

## 2022-12-31 DIAGNOSIS — E669 Obesity, unspecified: Secondary | ICD-10-CM | POA: Diagnosis not present

## 2023-01-02 DIAGNOSIS — Z4789 Encounter for other orthopedic aftercare: Secondary | ICD-10-CM | POA: Diagnosis not present

## 2023-01-02 DIAGNOSIS — Z6841 Body Mass Index (BMI) 40.0 and over, adult: Secondary | ICD-10-CM | POA: Diagnosis not present

## 2023-01-02 DIAGNOSIS — E669 Obesity, unspecified: Secondary | ICD-10-CM | POA: Diagnosis not present

## 2023-01-05 DIAGNOSIS — Z4789 Encounter for other orthopedic aftercare: Secondary | ICD-10-CM | POA: Diagnosis not present

## 2023-01-05 DIAGNOSIS — E669 Obesity, unspecified: Secondary | ICD-10-CM | POA: Diagnosis not present

## 2023-01-05 DIAGNOSIS — Z6841 Body Mass Index (BMI) 40.0 and over, adult: Secondary | ICD-10-CM | POA: Diagnosis not present

## 2023-01-07 ENCOUNTER — Telehealth: Payer: Self-pay

## 2023-01-07 DIAGNOSIS — Z4789 Encounter for other orthopedic aftercare: Secondary | ICD-10-CM | POA: Diagnosis not present

## 2023-01-07 DIAGNOSIS — E669 Obesity, unspecified: Secondary | ICD-10-CM | POA: Diagnosis not present

## 2023-01-07 DIAGNOSIS — Z6841 Body Mass Index (BMI) 40.0 and over, adult: Secondary | ICD-10-CM | POA: Diagnosis not present

## 2023-01-07 NOTE — Telephone Encounter (Signed)
Verbal was given for OT 2 x week for 1 week, and 1 x week for 2 weeks.

## 2023-01-19 DIAGNOSIS — I129 Hypertensive chronic kidney disease with stage 1 through stage 4 chronic kidney disease, or unspecified chronic kidney disease: Secondary | ICD-10-CM | POA: Diagnosis not present

## 2023-01-19 DIAGNOSIS — Z809 Family history of malignant neoplasm, unspecified: Secondary | ICD-10-CM | POA: Diagnosis not present

## 2023-01-19 DIAGNOSIS — N182 Chronic kidney disease, stage 2 (mild): Secondary | ICD-10-CM | POA: Diagnosis not present

## 2023-01-19 DIAGNOSIS — R32 Unspecified urinary incontinence: Secondary | ICD-10-CM | POA: Diagnosis not present

## 2023-01-19 DIAGNOSIS — E785 Hyperlipidemia, unspecified: Secondary | ICD-10-CM | POA: Diagnosis not present

## 2023-01-19 DIAGNOSIS — R69 Illness, unspecified: Secondary | ICD-10-CM | POA: Diagnosis not present

## 2023-01-19 DIAGNOSIS — G4733 Obstructive sleep apnea (adult) (pediatric): Secondary | ICD-10-CM | POA: Diagnosis not present

## 2023-01-19 DIAGNOSIS — Z9181 History of falling: Secondary | ICD-10-CM | POA: Diagnosis not present

## 2023-01-20 ENCOUNTER — Telehealth: Payer: Self-pay

## 2023-01-20 NOTE — Telephone Encounter (Signed)
Cynthia [name] called wanting to know if it is safe for her to take Ibuprofen now.  She had been told in the past to hold the Ibuprofen because of abnormal renal function.  She currently is getting physical therapy and they are recommending that she take advil if this is safe for her.

## 2023-01-21 NOTE — Telephone Encounter (Signed)
Left patient detailed message and to call office with questions.

## 2023-02-04 DIAGNOSIS — R69 Illness, unspecified: Secondary | ICD-10-CM | POA: Diagnosis not present

## 2023-02-04 DIAGNOSIS — F3132 Bipolar disorder, current episode depressed, moderate: Secondary | ICD-10-CM | POA: Diagnosis not present

## 2023-03-05 DIAGNOSIS — R69 Illness, unspecified: Secondary | ICD-10-CM | POA: Diagnosis not present

## 2023-03-05 DIAGNOSIS — F3132 Bipolar disorder, current episode depressed, moderate: Secondary | ICD-10-CM | POA: Diagnosis not present

## 2023-04-01 DIAGNOSIS — F3112 Bipolar disorder, current episode manic without psychotic features, moderate: Secondary | ICD-10-CM | POA: Diagnosis not present

## 2023-04-01 DIAGNOSIS — R69 Illness, unspecified: Secondary | ICD-10-CM | POA: Diagnosis not present

## 2023-04-13 ENCOUNTER — Other Ambulatory Visit: Payer: Self-pay | Admitting: Family Medicine

## 2023-04-13 DIAGNOSIS — I10 Essential (primary) hypertension: Secondary | ICD-10-CM

## 2023-05-08 DIAGNOSIS — R0902 Hypoxemia: Secondary | ICD-10-CM | POA: Diagnosis not present

## 2023-05-08 DIAGNOSIS — R531 Weakness: Secondary | ICD-10-CM | POA: Diagnosis not present

## 2023-05-08 DIAGNOSIS — K449 Diaphragmatic hernia without obstruction or gangrene: Secondary | ICD-10-CM | POA: Diagnosis not present

## 2023-05-08 DIAGNOSIS — I10 Essential (primary) hypertension: Secondary | ICD-10-CM | POA: Diagnosis not present

## 2023-05-08 DIAGNOSIS — I440 Atrioventricular block, first degree: Secondary | ICD-10-CM | POA: Diagnosis not present

## 2023-05-08 DIAGNOSIS — I493 Ventricular premature depolarization: Secondary | ICD-10-CM | POA: Diagnosis not present

## 2023-05-08 DIAGNOSIS — R51 Headache with orthostatic component, not elsewhere classified: Secondary | ICD-10-CM | POA: Diagnosis not present

## 2023-05-08 DIAGNOSIS — R42 Dizziness and giddiness: Secondary | ICD-10-CM | POA: Diagnosis not present

## 2023-05-08 DIAGNOSIS — D509 Iron deficiency anemia, unspecified: Secondary | ICD-10-CM | POA: Diagnosis not present

## 2023-05-08 DIAGNOSIS — Z743 Need for continuous supervision: Secondary | ICD-10-CM | POA: Diagnosis not present

## 2023-05-08 DIAGNOSIS — R9431 Abnormal electrocardiogram [ECG] [EKG]: Secondary | ICD-10-CM | POA: Diagnosis not present

## 2023-05-13 ENCOUNTER — Telehealth: Payer: Self-pay | Admitting: *Deleted

## 2023-05-13 DIAGNOSIS — F3112 Bipolar disorder, current episode manic without psychotic features, moderate: Secondary | ICD-10-CM | POA: Diagnosis not present

## 2023-05-13 NOTE — Telephone Encounter (Signed)
Transition Care Management Unsuccessful Follow-up Telephone Call  Date of discharge and from where:  [place] [facility] 05/08/2023  Attempts:  1st Attempt  Reason for unsuccessful TCM follow-up call:  Left voice message

## 2023-07-08 DIAGNOSIS — F3112 Bipolar disorder, current episode manic without psychotic features, moderate: Secondary | ICD-10-CM | POA: Diagnosis not present

## 2023-07-21 DIAGNOSIS — M4725 Other spondylosis with radiculopathy, thoracolumbar region: Secondary | ICD-10-CM | POA: Diagnosis not present

## 2023-07-21 DIAGNOSIS — M4804 Spinal stenosis, thoracic region: Secondary | ICD-10-CM | POA: Diagnosis not present

## 2023-07-21 DIAGNOSIS — M47814 Spondylosis without myelopathy or radiculopathy, thoracic region: Secondary | ICD-10-CM | POA: Diagnosis not present

## 2023-07-21 DIAGNOSIS — M5116 Intervertebral disc disorders with radiculopathy, lumbar region: Secondary | ICD-10-CM | POA: Diagnosis not present

## 2023-07-21 DIAGNOSIS — R609 Edema, unspecified: Secondary | ICD-10-CM | POA: Diagnosis not present

## 2023-07-21 DIAGNOSIS — M5117 Intervertebral disc disorders with radiculopathy, lumbosacral region: Secondary | ICD-10-CM | POA: Diagnosis not present

## 2023-07-21 DIAGNOSIS — M4313 Spondylolisthesis, cervicothoracic region: Secondary | ICD-10-CM | POA: Diagnosis not present

## 2023-07-21 DIAGNOSIS — M5415 Radiculopathy, thoracolumbar region: Secondary | ICD-10-CM | POA: Diagnosis not present

## 2023-07-21 DIAGNOSIS — M4726 Other spondylosis with radiculopathy, lumbar region: Secondary | ICD-10-CM | POA: Diagnosis not present

## 2023-08-13 ENCOUNTER — Ambulatory Visit (INDEPENDENT_AMBULATORY_CARE_PROVIDER_SITE_OTHER): Payer: Medicare HMO

## 2023-08-13 VITALS — Ht 63.0 in | Wt 253.0 lb

## 2023-08-13 DIAGNOSIS — Z0000 Encounter for general adult medical examination without abnormal findings: Secondary | ICD-10-CM

## 2023-08-13 NOTE — Progress Notes (Signed)
Subjective:   Cynthia [name] is a 62 y.o. female who presents for Medicare Annual (Subsequent) preventive examination.  Visit Complete: Virtual  I connected with  Cynthia [name] on 08/13/23 by a audio enabled telemedicine application and verified that I am speaking with the correct person using two identifiers.  Patient Location: Home  Provider Location: Home Office  I discussed the limitations of evaluation and management by telemedicine. The patient expressed understanding and agreed to proceed.  Patient Medicare AWV questionnaire was completed by the patient on 08/13/23; I have confirmed that all information answered by patient is correct and no changes since this date.  Review of Systems    Vital Signs: Unable to obtain new vitals due to this being a telehealth visit.  Cardiac Risk Factors include: advanced age (>70men, >34 women);obesity (BMI >30kg/m2)     Objective:    Today's Vitals   08/13/23 1438  Weight: 253 lb (114.8 kg)  Height: 5\' 3"  (1.6 m)   Body mass index is 44.82 kg/m.     08/13/2023    2:53 PM 04/29/2022    2:08 AM 08/18/2021    8:39 AM 08/29/2020    1:08 AM 08/26/2020    5:26 PM 07/04/2019    2:08 AM  Advanced Directives  Does Patient Have a Medical Advance Directive? No No Yes  No Unable to assess, patient is non-responsive or altered mental status  Type of Advance Directive   Living will     Does patient want to make changes to medical advance directive?   No - Patient declined     Would patient like information on creating a medical advance directive? No - Patient declined    No - Patient declined      Information is confidential and restricted. Go to Review Flowsheets to unlock data.    Current Medications (verified) Outpatient Encounter Medications as of 08/13/2023  Medication Sig   albuterol (VENTOLIN HFA) 108 (90 Base) MCG/ACT inhaler Inhale 1-2 puffs into the lungs every 4 (four) hours as needed for wheezing or shortness of breath.    Budeson-Glycopyrrol-Formoterol (BREZTRI AEROSPHERE) 160-9-4.8 MCG/ACT AERO Inhale 2 puffs into the lungs 2 (two) times daily as needed. (Patient taking differently: Inhale 2 puffs into the lungs 2 (two) times daily as needed (COPD).)   buPROPion (WELLBUTRIN XL) 150 MG 24 hr tablet Take 1 tablet (150 mg total) by mouth daily. For depression   cholecalciferol (VITAMIN D3) 25 MCG (1000 UNIT) tablet Take 1,000 Units by mouth daily.   divalproex (DEPAKOTE) 500 MG DR tablet Take 1 tablet (500 mg total) by mouth every 12 (twelve) hours. For mood stabilization (Patient taking differently: Take 500 mg by mouth daily. For mood stabilization)   ferrous gluconate (FERGON) 324 MG tablet Take 324 mg by mouth daily.   HYDROcodone-acetaminophen (NORCO/VICODIN) 5-325 MG tablet Take 1 tablet by mouth every 4 (four) hours as needed for severe pain (OSteoarthritis of lumbar spine.).   losartan (COZAAR) 50 MG tablet TAKE 1 TABLET(50 MG) BY MOUTH DAILY   rosuvastatin (CRESTOR) 20 MG tablet Take 20 mg by mouth daily.   tiZANidine (ZANAFLEX) 4 MG tablet Take 1 tablet (4 mg total) by mouth every 6 (six) hours as needed for muscle spasms.   No facility-administered encounter medications on file as of 08/13/2023.    Allergies (verified) Patient has no known allergies.   History: Past Medical History:  Diagnosis Date   Bipolar 1 disorder (HCC)    Essential hypertension  GERD (gastroesophageal reflux disease)    Past Surgical History:  Procedure Laterality Date   LUMBAR LAMINECTOMY/DECOMPRESSION MICRODISCECTOMY N/A 12/01/2022   Procedure: THORACIC ONE, THORACIC TWO LAMINECTOMY/DECOMPRESSION FOR SYNOVIAL CYST RESECTION;  Surgeon: Coletta Memos, MD;  Location: [facility] OR;  Service: Neurosurgery;  Laterality: N/A;   NO PAST SURGERIES     Family History  Problem Relation Age of Onset   Cancer Mother        lung   Cancer Father        prostate   Diabetes Paternal Grandfather    Social History   Socioeconomic History    Marital status: Divorced    Spouse name: Not on file   Number of children: 3   Years of education: Not on file   Highest education level: Not on file  Occupational History   Not on file  Tobacco Use   Smoking status: Never   Smokeless tobacco: Never  Vaping Use   Vaping status: Never Used  Substance and Sexual Activity   Alcohol use: Not Currently    Comment: rarely   Drug use: Never   Sexual activity: Yes    Partners: Male  Other Topics Concern   Not on file  Social History Narrative   Not on file   Social Determinants of Health   Financial Resource Strain: Low Risk  (08/13/2023)   Overall Financial Resource Strain (CARDIA)    Difficulty of Paying Living Expenses: Not hard at all  Food Insecurity: No Food Insecurity (08/13/2023)   Hunger Vital Sign    Worried About Running Out of Food in the Last Year: Never true    Ran Out of Food in the Last Year: Never true  Transportation Needs: No Transportation Needs (08/13/2023)   PRAPARE - Administrator, Civil Service (Medical): No    Lack of Transportation (Non-Medical): No  Physical Activity: Sufficiently Active (08/13/2023)   Exercise Vital Sign    Days of Exercise per Week: 5 days    Minutes of Exercise per Session: 30 min  Stress: No Stress Concern Present (08/13/2023)   [employer] of Occupational Health - Occupational Stress Questionnaire    Feeling of Stress : Only a little  Social Connections: Socially Isolated (08/13/2023)   Social Connection and Isolation Panel [NHANES]    Frequency of Communication with Friends and Family: More than three times a week    Frequency of Social Gatherings with Friends and Family: More than three times a week    Attends Religious Services: Never    Database administrator or Organizations: No    Attends Engineer, structural: Never    Marital Status: Divorced    Tobacco Counseling Counseling given: Not Answered   Clinical Intake:  Pre-visit preparation  completed: Yes  Pain : No/denies pain     BMI - recorded: 44.82 Nutritional Status: BMI > 30  Obese Nutritional Risks: None Diabetes: No  How often do you need to have someone help you when you read instructions, pamphlets, or other written materials from your doctor or pharmacy?: 1 - Never  Interpreter Needed?: No  Information entered by :: Theresa Mulligan LPN   Activities of Daily Living    08/13/2023    9:29 AM 08/15/2022    8:58 AM  In your present state of health, do you have any difficulty performing the following activities:  Hearing? 0 1  Vision? 1 0  Comment Pending appt   Difficulty concentrating or making decisions? 0 0  Walking or climbing stairs? 1 1  Comment Uses a walker   Dressing or bathing? 0 0  Doing errands, shopping? 1 0  Comment Family Advice worker and eating ? N N  Using the Toilet? N N  In the past six months, have you accidently leaked urine? Y N  Comment Wears pads. Followed by PCP   Do you have problems with loss of bowel control? N N  Managing your Medications? N N  Managing your Finances? N N  Housekeeping or managing your Housekeeping? N N    Patient Care Team: CoxFritzi Mandes, MD as PCP - General (Family Medicine) Flora Lipps Sharyne Peach (Nurse Practitioner)  Indicate any recent Medical Services you may have received from other than [facility] providers in the past year (date may be approximate).     Assessment:   This is a routine wellness examination for [place].  Hearing/Vision screen Hearing Screening - Comments:: Denies hearing difficulties   Vision Screening - Comments:: Wears rx glasses - up to date with routine eye exams with  Deferred   Goals Addressed               This Visit's Progress     Increase physical activity (pt-stated)        Improve my walking.       Depression Screen    08/13/2023    2:46 PM 08/27/2022   10:24 AM 02/20/2022   11:02 AM 02/06/2022    2:19 PM 08/18/2021    8:35 AM 07/19/2021    9:36 AM  10/22/2020    1:54 PM  PHQ 2/9 Scores  PHQ - 2 Score 0 0 0 0 0 0 6  PHQ- 9 Score    3  6 18     Fall Risk    08/13/2023    9:29 AM 08/15/2022    8:58 AM 02/20/2022   11:02 AM 02/06/2022    2:19 PM 08/18/2021    8:41 AM  Fall Risk   Falls in the past year? 1 1 1 1  0  Number falls in past yr: 0 1 1 1  0  Injury with Fall? 0 0 0 0 0  Risk for fall due to :  History of fall(s)   Impaired mobility  Follow up  Falls evaluation completed;Education provided;Falls prevention discussed   Education provided    MEDICARE RISK AT HOME: Medicare Risk at Home Any stairs in or around the home?: Yes If so, are there any without handrails?: No Home free of loose throw rugs in walkways, pet beds, electrical cords, etc?: Yes Adequate lighting in your home to reduce risk of falls?: Yes Life alert?: No Use of a cane, walker or w/c?: No Grab bars in the bathroom?: No Shower chair or bench in shower?: Yes Elevated toilet seat or a handicapped toilet?: No  TIMED UP AND GO:  Was the test performed?  No    Cognitive Function:        08/13/2023    2:53 PM 08/18/2021    8:47 AM  6CIT Screen  What Year? 0 points 0 points  What month? 0 points 0 points  What time? 0 points 0 points  Count back from 20 0 points 0 points  Months in reverse 0 points 0 points  Repeat phrase 0 points 0 points  Total Score 0 points 0 points    Immunizations Immunization History  Administered Date(s) Administered   Influenza Inj Mdck Quad Pf 08/22/2020, 08/16/2021   Influenza,inj,Quad  PF,6+ Mos 08/27/2022   Influenza-Unspecified 08/22/2019   Moderna Covid-19 Vaccine Bivalent Booster 49yrs & up 11/22/2021   PNEUMOCOCCAL CONJUGATE-20 11/22/2021    TDAP status: Due, Education has been provided regarding the importance of this vaccine. Advised may receive this vaccine at local pharmacy or Health Dept. Aware to provide a copy of the vaccination record if obtained from local pharmacy or Health Dept. Verbalized acceptance and  understanding.  Flu Vaccine status: Due, Education has been provided regarding the importance of this vaccine. Advised may receive this vaccine at local pharmacy or Health Dept. Aware to provide a copy of the vaccination record if obtained from local pharmacy or Health Dept. Verbalized acceptance and understanding.    Covid-19 vaccine status: Declined, Education has been provided regarding the importance of this vaccine but patient still declined. Advised may receive this vaccine at local pharmacy or Health Dept.or vaccine clinic. Aware to provide a copy of the vaccination record if obtained from local pharmacy or Health Dept. Verbalized acceptance and understanding.  Qualifies for Shingles Vaccine? Yes   Zostavax completed No   Shingrix Completed?: No.    Education has been provided regarding the importance of this vaccine. Patient has been advised to call insurance company to determine out of pocket expense if they have not yet received this vaccine. Advised may also receive vaccine at local pharmacy or Health Dept. Verbalized acceptance and understanding.  Screening Tests Health Maintenance  Topic Date Due   DTaP/Tdap/Td (1 - Tdap) Never done   PAP SMEAR-Modifier  Never done   Zoster Vaccines- Shingrix (1 of 2) Never done   INFLUENZA VACCINE  07/09/2023   COVID-19 Vaccine (2 - 2023-24 season) 08/09/2023   MAMMOGRAM  08/28/2023 (Originally 01/26/2020)   Medicare Annual Wellness (AWV)  08/12/2024   Colonoscopy  01/23/2032   Hepatitis C Screening  Completed   HIV Screening  Completed   HPV VACCINES  Aged Out    Health Maintenance  Health Maintenance Due  Topic Date Due   DTaP/Tdap/Td (1 - Tdap) Never done   PAP SMEAR-Modifier  Never done   Zoster Vaccines- Shingrix (1 of 2) Never done   INFLUENZA VACCINE  07/09/2023   COVID-19 Vaccine (2 - 2023-24 season) 08/09/2023    Colorectal cancer screening: Type of screening: Colonoscopy. Completed 01/22/22. Repeat every 10  years  Mammogram status: Ordered Deferred. Pt provided with contact info and advised to call to schedule appt.     Lung Cancer Screening: (Low Dose CT Chest recommended if Age 54-80 years, 20 pack-year currently smoking OR have quit w/in 15years.) does not qualify.     Additional Screening:  Hepatitis C Screening: does qualify; Completed 06/27/22  Vision Screening: Recommended annual ophthalmology exams for early detection of glaucoma and other disorders of the eye. Is the patient up to date with their annual eye exam?  Yes  Who is the provider or what is the name of the office in which the patient attends annual eye exams? Deferred If pt is not established with a provider, would they like to be referred to a provider to establish care? No .   Dental Screening: Recommended annual dental exams for proper oral hygiene   Community Resource Referral / Chronic Care Management:  CRR required this visit?  No   CCM required this visit?  No     Plan:     I have personally reviewed and noted the following in the patient's chart:   Medical and social history Use of alcohol, tobacco  or illicit drugs  Current medications and supplements including opioid prescriptions. Patient is currently taking opioid prescriptions. Information provided to patient regarding non-opioid alternatives. Patient advised to discuss non-opioid treatment plan with their provider. Functional ability and status Nutritional status Physical activity Advanced directives List of other physicians Hospitalizations, surgeries, and ER visits in previous 12 months Vitals Screenings to include cognitive, depression, and falls Referrals and appointments  In addition, I have reviewed and discussed with patient certain preventive protocols, quality metrics, and best practice recommendations. A written personalized care plan for preventive services as well as general preventive health recommendations were provided to  patient.     Tillie Rung, LPN   03/13/9628   After Visit Summary: (MyChart) Due to this being a telephonic visit, the after visit summary with patients personalized plan was offered to patient via MyChart   Nurse Notes: None

## 2023-08-13 NOTE — Patient Instructions (Addendum)
Cynthia [name] , Thank you for taking time to come for your Medicare Wellness Visit. I appreciate your ongoing commitment to your health goals. Please review the following plan we discussed and let me know if I can assist you in the future.   Referrals/Orders/Follow-Ups/Clinician Recommendations:   This is a list of the screening recommended for you and due dates:  Health Maintenance  Topic Date Due   DTaP/Tdap/Td vaccine (1 - Tdap) Never done   Pap Smear  Never done   Zoster (Shingles) Vaccine (1 of 2) Never done   Flu Shot  07/09/2023   COVID-19 Vaccine (2 - 2023-24 season) 08/09/2023   Mammogram  08/28/2023*   Medicare Annual Wellness Visit  08/12/2024   Colon Cancer Screening  01/23/2032   Hepatitis C Screening  Completed   HIV Screening  Completed   HPV Vaccine  Aged Out  *Topic was postponed. The date shown is not the original due date.   Opioid Pain Medicine Management Opioid pain medicines are strong medicines that are used to treat bad or very bad pain. When you take them for a short time, they can help you: Sleep better. Do better in physical therapy. Feel better during the first few days after you get hurt. Recover from surgery. Only take these medicines if a doctor says that you can. You should only take them for a short time. This is because opioids can be very addictive. This means that they are hard to stop taking. The longer you take opioids, the harder it may be to stop taking them. What are the risks? Opioids can cause problems (side effects). Taking them for more than 3 days raises your chance of problems, such as: Trouble pooping (constipation). Feeling sick to your stomach (nausea). Vomiting. Feeling very sleepy. Confusion. Not being able to stop taking the medicine. Breathing problems. Taking opioids for a long time can make it hard for you to do daily tasks. It can also put you at risk for: Car accidents. Depression. Suicide. Heart attack. Taking too much  of the medicine (overdose). This can lead to death. What is a pain treatment plan? A pain treatment plan is a plan made by you and your doctor. Work with your doctor to make a plan for treating your pain. To help you do this: Talk about the goals of your treatment, including: How much pain you might expect to have. How you will manage the pain. Talk about the risks and benefits of taking these medicines for your condition. Remember that a good treatment plan uses more than one approach and lowers the risks of side effects. Tell your doctor about the amount of medicines you take and about any drug or alcohol use. Get your pain medicine prescriptions from only one doctor. Pain can be managed with other treatments. Work with your doctor to find other ways to help your pain, such as: Physical therapy or doing gentle exercises. Counseling. Eating healthy foods. Massage. Meditation. Other pain medicines. How to use opioid pain medicine safely Taking medicine Take your pain medicine exactly as told by your doctor. Take it only when you need it. If your pain is not too bad, you may take less medicine if your doctor allows. If you have no pain, do not take the medicine unless your doctor tells you to take it. If your pain is very bad, do not take more medicine than your doctor told you to take. Call your doctor to know what to do. Write down the  times when you take your pain medicine. Look at the times before you take your next dose. Take other over-the-counter or prescription medicines only as told by your doctor. Keeping yourself and others safe  While you are taking opioids: Do not drive, use machines, or power tools. Do not sign important papers (legal documents). Do not drink alcohol. Do not take sleeping pills. Do not take care of children by yourself. Do not do activities where you need to climb or be in high places, like working on a ladder. Do not go to a lake, river, ocean,  swimming pool, or hot tub. Keep your opioids locked up or in a place where children cannot reach them. Do not share your pain medicine with anyone. Stopping your use of opioids If you have been taking opioids for more than a few weeks, you may need to slowly decrease (taper) how much you take until you stop taking them. Doing this can lower your chance of having symptoms.  Symptoms that come from suddenly stopping the use of opioids include: Pain and cramping in your belly (abdomen). Feeling sick to your stomach (nausea).z Sweating. Feeling very sleepy. Feeling restless. Shaking you cannot control (tremors). Cravings for the medicine. Do not try to stop taking them by yourself. Work with your doctor to stop. Your doctor will help you take less until you are not taking the medicine at all. Getting rid of unused pills Do not save any pills that you did not use. Get rid of the pills by: Taking them to a take-back program in your area. Bringing them to a pharmacy that receives unused pills. Flushing them down the toilet. Check the label or package insert of your medicine to see whether this is safe to do. Throwing them in the trash. Check the label or package insert of your medicine to see whether this is safe to do. If it is safe to throw them out: Take the pills out of their container. Put the pills into a container you can seal. Mix the pills with used coffee grounds, food scraps, dirt, or cat litter. Put this in the trash. Follow these instructions at home: Activity Do exercises as told by your doctor. Avoid doing things that make your pain worse. Return to your normal activities as told by your doctor. Ask your doctor what activities are safe for you. General instructions You may need to take these actions to prevent or treat constipation: Drink enough fluid to keep your pee (urine) pale yellow. Take over-the-counter or prescription medicines. Eat foods that are high in fiber. These  include beans, whole grains, and fresh fruits and vegetables. Limit foods that are high in fat and sugar. These include fried or sweet foods. Keep all follow-up visits. Where to find support If you have been taking opioids for a long time, get help from a local support group or counselor. Ask your doctor about this. Where to find more information Centers for Disease Control and Prevention (CDC): FootballExhibition.com.br [place] Food and Drug Administration (FDA): PumpkinSearch.com.ee Get help right away if: You may have taken too much of an opioid (overdosed). Common symptoms of an overdose: Your breathing is slower or more shallow than normal. You have a very slow heartbeat. Your speech is not normal. You vomit or you feel as if you may vomit. The black centers of your eyes (pupils) are smaller than normal. You have other potential symptoms: You feel very confused. You faint. You are very sleepy. You have cold skin. You  have blue lips or fingernails. You have thoughts of harming yourself or harming others. These symptoms may be an emergency. Get help right away. Call your local emergency services (911 in the [place] Do not wait to see if the symptoms will go away. Do not drive yourself to the hospital. Get help right away if you feel like you may hurt yourself or others, or have thoughts about taking your own life. Go to your nearest emergency room or: Call your local emergency services (911 in the [place] Call the [facility] at [phone]. Call a suicide crisis helpline, such as the National Suicide Prevention Lifeline at (806) 880-8100 or 988 in the [place] This is open 24 hours a day. Text the Crisis Text Line at 872-127-5375. Summary Opioid are strong medicines that are used to treat bad or very bad pain. A pain treatment plan is a plan made by you and your doctor. Work with your doctor to make a plan for treating your pain. If you think that you or someone else may have taken too much of  an opioid, get help right away. This information is not intended to replace advice given to you by your health care provider. Make sure you discuss any questions you have with your health care provider. Document Revised: 06/19/2021 Document Reviewed: 03/06/2021 Elsevier Patient Education  2024 Elsevier Inc.  Advanced directives: (Declined) Advance directive discussed with you today. Even though you declined this today, please call our office should you change your mind, and we can give you the proper paperwork for you to fill out.  Next Medicare Annual Wellness Visit scheduled for next year: Yes

## 2023-08-20 ENCOUNTER — Encounter: Payer: Self-pay | Admitting: Family Medicine

## 2023-08-20 ENCOUNTER — Ambulatory Visit (INDEPENDENT_AMBULATORY_CARE_PROVIDER_SITE_OTHER): Payer: Medicare HMO | Admitting: Family Medicine

## 2023-08-20 VITALS — BP 138/78 | HR 86 | Temp 97.2°F | Ht 63.0 in | Wt 224.0 lb

## 2023-08-20 DIAGNOSIS — I10 Essential (primary) hypertension: Secondary | ICD-10-CM | POA: Diagnosis not present

## 2023-08-20 DIAGNOSIS — Z23 Encounter for immunization: Secondary | ICD-10-CM | POA: Diagnosis not present

## 2023-08-20 DIAGNOSIS — F3130 Bipolar disorder, current episode depressed, mild or moderate severity, unspecified: Secondary | ICD-10-CM | POA: Diagnosis not present

## 2023-08-20 DIAGNOSIS — R7303 Prediabetes: Secondary | ICD-10-CM | POA: Diagnosis not present

## 2023-08-20 DIAGNOSIS — E782 Mixed hyperlipidemia: Secondary | ICD-10-CM

## 2023-08-20 DIAGNOSIS — K219 Gastro-esophageal reflux disease without esophagitis: Secondary | ICD-10-CM

## 2023-08-20 DIAGNOSIS — D508 Other iron deficiency anemias: Secondary | ICD-10-CM | POA: Diagnosis not present

## 2023-08-20 NOTE — Progress Notes (Unsigned)
Subjective:  Patient ID: Cynthia [name], female    DOB: 04-07-1961  Age: 62 y.o. MRN: [id number]  Chief Complaint  Patient presents with   Medical Management of Chronic Issues    HPI Has not taken any medications since back surgery 12/01/2022   Hypercholesterolemia: Was taking crestor 20 mg  HTN: Was taking losartan 50 mg   Depression: Was taking wellbutrin 150 mg daily, depakote 500mg  q12 hours. Patient is seeing Myrtie Neither, NP.  Patient denies any significant depression or manic episodes.     08/20/2023   10:50 AM 08/13/2023    2:46 PM 08/27/2022   10:24 AM 02/20/2022   11:02 AM 02/06/2022    2:19 PM  Depression screen PHQ 2/9  Decreased Interest  0 0 0 0  Down, Depressed, Hopeless  0 0 0 0  PHQ - 2 Score  0 0 0 0  Altered sleeping 0    0  Tired, decreased energy     0  Change in appetite 1    0  Feeling bad or failure about yourself  0    0  Trouble concentrating 1    0  Moving slowly or fidgety/restless 0    3  Suicidal thoughts 0    0  PHQ-9 Score     3        08/13/2023    9:29 AM  Fall Risk   Falls in the past year? 1  Number falls in past yr: 0  Injury with Fall? 0    Patient Care Team: Blane Ohara, MD as PCP - General (Family Medicine) Flora Lipps Sharyne Peach (Nurse Practitioner)   Review of Systems  Constitutional:  Negative for chills, fatigue and fever.  HENT:  Positive for ear pain (Right). Negative for congestion, rhinorrhea and sore throat.   Respiratory:  Negative for cough and shortness of breath.   Cardiovascular:  Negative for chest pain.  Gastrointestinal:  Negative for abdominal pain, constipation, diarrhea, nausea and vomiting.  Genitourinary:  Negative for dysuria and urgency.  Musculoskeletal:  Positive for back pain. Negative for myalgias.  Neurological:  Negative for dizziness, weakness, light-headedness and headaches.  Psychiatric/Behavioral:  Negative for dysphoric mood. The patient is not nervous/anxious.     Current Outpatient  Medications on File Prior to Visit  Medication Sig Dispense Refill   albuterol (VENTOLIN HFA) 108 (90 Base) MCG/ACT inhaler Inhale 1-2 puffs into the lungs every 4 (four) hours as needed for wheezing or shortness of breath. (Patient not taking: Reported on 08/20/2023) 1 each 3   Budeson-Glycopyrrol-Formoterol (BREZTRI AEROSPHERE) 160-9-4.8 MCG/ACT AERO Inhale 2 puffs into the lungs 2 (two) times daily as needed. (Patient not taking: Reported on 08/20/2023) 10.7 g 3   buPROPion (WELLBUTRIN XL) 150 MG 24 hr tablet Take 1 tablet (150 mg total) by mouth daily. For depression (Patient not taking: Reported on 08/20/2023) 30 tablet 0   cholecalciferol (VITAMIN D3) 25 MCG (1000 UNIT) tablet Take 1,000 Units by mouth daily. (Patient not taking: Reported on 08/20/2023)     divalproex (DEPAKOTE) 500 MG DR tablet Take 1 tablet (500 mg total) by mouth every 12 (twelve) hours. For mood stabilization (Patient not taking: Reported on 08/20/2023) 60 tablet 0   ferrous gluconate (FERGON) 324 MG tablet Take 324 mg by mouth daily. (Patient not taking: Reported on 08/20/2023)     HYDROcodone-acetaminophen (NORCO/VICODIN) 5-325 MG tablet Take 1 tablet by mouth every 4 (four) hours as needed for severe pain (OSteoarthritis of lumbar spine.). (Patient  not taking: Reported on 08/20/2023) 30 tablet 0   losartan (COZAAR) 50 MG tablet TAKE 1 TABLET(50 MG) BY MOUTH DAILY (Patient not taking: Reported on 08/20/2023) 90 tablet 0   rosuvastatin (CRESTOR) 20 MG tablet Take 20 mg by mouth daily. (Patient not taking: Reported on 08/20/2023)     tiZANidine (ZANAFLEX) 4 MG tablet Take 1 tablet (4 mg total) by mouth every 6 (six) hours as needed for muscle spasms. (Patient not taking: Reported on 08/20/2023) 60 tablet 0   No current facility-administered medications on file prior to visit.   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;  Surgeon: Coletta Memos, MD;  Location: [facility] OR;  Service: Neurosurgery;  Laterality: N/A;    Family History  Problem Relation Age of Onset   Cancer Mother        lung   Cancer Father        prostate   Diabetes Paternal Grandfather    Social History   Socioeconomic History   Marital status: Divorced    Spouse name: Not on file   Number of children: 3   Years of education: Not on file   Highest education level: 12th grade  Occupational History   Not on file  Tobacco Use   Smoking status: Never   Smokeless tobacco: Never  Vaping Use   Vaping status: Never Used  Substance and Sexual Activity   Alcohol use: Not Currently    Comment: rarely   Drug use: Never   Sexual activity: Yes    Partners: Male  Other Topics Concern   Not on file  Social History Narrative   Not on file   Social Determinants of Health   Financial Resource Strain: Low Risk  (08/19/2023)   Overall Financial Resource Strain (CARDIA)    Difficulty of Paying Living Expenses: Not hard at all  Food Insecurity: No Food Insecurity (08/19/2023)   Hunger Vital Sign    Worried About Running Out of Food in the Last Year: Never true    Ran Out of Food in the Last Year: Never true  Transportation Needs: No Transportation Needs (08/19/2023)   PRAPARE - Administrator, Civil Service (Medical): No    Lack of Transportation (Non-Medical): No  Physical Activity: Insufficiently Active (08/19/2023)   Exercise Vital Sign    Days of Exercise per Week: 4 days    Minutes of Exercise per Session: 30 min  Stress: No Stress Concern Present (08/19/2023)   [employer] of Occupational Health - Occupational Stress Questionnaire    Feeling of Stress : Only a little  Social Connections: Moderately Isolated (08/19/2023)    Social Connection and Isolation Panel [NHANES]    Frequency of Communication with Friends and Family: Three times a week    Frequency of Social Gatherings with Friends and Family: Once a week    Attends Religious Services: 1 to 4 times per year    Active Member of [employer] or Organizations: No    Attends Engineer, structural: Never    Marital Status: Divorced    Objective:  BP 138/78   Pulse 86   Temp (!) 97.2 F (36.2 C)   Ht 5\' 3"  (1.6 m)   Wt 224  lb (101.6 kg)   LMP  (LMP Unknown)   SpO2 99%   BMI 39.68 kg/m      08/20/2023   10:15 AM 08/13/2023    2:38 PM 12/03/2022    7:35 AM  BP/Weight  Systolic BP 138 -- 129  Diastolic BP 78 -- 70  Wt. (Lbs) 224 253   BMI 39.68 kg/m2 44.82 kg/m2     Physical Exam Vitals reviewed.  Constitutional:      Appearance: Normal appearance. She is normal weight.  Neck:     Vascular: No carotid bruit.  Cardiovascular:     Rate and Rhythm: Normal rate and regular rhythm.     Heart sounds: Normal heart sounds.  Pulmonary:     Effort: Pulmonary effort is normal. No respiratory distress.     Breath sounds: Normal breath sounds.  Abdominal:     General: Abdomen is flat. Bowel sounds are normal.     Palpations: Abdomen is soft.     Tenderness: There is no abdominal tenderness.  Neurological:     Mental Status: She is alert and oriented to person, place, and time.     Gait: Gait abnormal (walking with walker.).  Psychiatric:        Mood and Affect: Mood normal.        Behavior: Behavior normal.     Diabetic Foot Exam - Simple   No data filed      Lab Results  Component Value Date   WBC 6.5 08/20/2023   HGB 9.9 (L) 08/20/2023   HCT 33.1 (L) 08/20/2023   PLT 371 08/20/2023   GLUCOSE 102 (H) 08/20/2023   CHOL 219 (H) 08/20/2023   TRIG 134 08/20/2023   HDL 60 08/20/2023   LDLCALC 135 (H) 08/20/2023   ALT 8 08/20/2023   AST 11 08/20/2023   NA 139 08/20/2023   K 5.0 08/20/2023   CL 103 08/20/2023   CREATININE 0.76  08/20/2023   BUN 13 08/20/2023   CO2 25 08/20/2023   TSH 0.708 08/20/2023   INR 1.0 12/01/2022   HGBA1C 5.9 (H) 08/25/2022      Assessment & Plan:    Primary hypertension Assessment & Plan: Well controlled.  Remain off antihypertensives for now. Continue to work on eating a healthy diet and exercise.  Labs drawn today.    Orders: -     CBC with Differential/Platelet -     Comprehensive metabolic panel  Prediabetes Assessment & Plan: Hemoglobin A1c 5.9%, 3 month avg of blood sugars, is in prediabetic range.  In order to prevent progression to diabetes, recommend low carb diet and regular exercise   Mixed hyperlipidemia Assessment & Plan: Cholesterol came back uncontrolled.  Recommend restart crestor 20 mg daily Continue to work on eating a healthy diet and exercise  Orders: -     Lipid panel -     TSH  Encounter for immunization -     Influenza, MDCK, trivalent, PF(Flucelvax egg-free) -     Pfizer Comirnaty Covid-19 Vaccine 73yrs & older  Other iron deficiency anemia Assessment & Plan: Check labs  Orders: -     Iron, TIBC and Ferritin Panel  Bipolar I disorder, most recent episode (or current) depressed (HCC) Assessment & Plan: I strongly recommend the patient discuss with psychiatry that she discontinued her medications.      No orders of the defined types were placed in this encounter.   Orders Placed This Encounter  Procedures   Influenza, MDCK, trivalent, PF(Flucelvax egg-free)  Pfizer Comirnaty Covid-19 Vaccine 45yrs & older   CBC with Differential/Platelet   Comprehensive metabolic panel   Lipid panel   Iron, TIBC and Ferritin Panel   TSH     Follow-up: Return in about 6 months (around 02/17/2024) for chronic follow up.   I,Katherina A Bramblett,acting as a scribe for Blane Ohara, MD.,have documented all relevant documentation on the behalf of Blane Ohara, MD,as directed by  Blane Ohara, MD while in the presence of Blane Ohara,  MD.   Clayborn Bigness I Leal-Borjas,acting as a scribe for Blane Ohara, MD.,have documented all relevant documentation on the behalf of Blane Ohara, MD,as directed by  Blane Ohara, MD while in the presence of Blane Ohara, MD.    An After Visit Summary was printed and given to the patient.  Blane Ohara, MD [facility] [phone]

## 2023-08-21 LAB — COMPREHENSIVE METABOLIC PANEL
ALT: 8 IU/L (ref 0–32)
AST: 11 IU/L (ref 0–40)
Albumin: 4 g/dL (ref 3.8–4.9)
Alkaline Phosphatase: 86 IU/L (ref 44–121)
BUN/Creatinine Ratio: 17 (ref 9–23)
BUN: 13 mg/dL (ref 6–24)
Bilirubin Total: 0.7 mg/dL (ref 0.0–1.2)
CO2: 25 mmol/L (ref 20–29)
Calcium: 9.4 mg/dL (ref 8.7–10.2)
Chloride: 103 mmol/L (ref 96–106)
Creatinine, Ser: 0.76 mg/dL (ref 0.57–1.00)
Globulin, Total: 2.8 g/dL (ref 1.5–4.5)
Glucose: 102 mg/dL — ABNORMAL HIGH (ref 70–99)
Potassium: 5 mmol/L (ref 3.5–5.2)
Sodium: 139 mmol/L (ref 134–144)
Total Protein: 6.8 g/dL (ref 6.0–8.5)
eGFR: 90 mL/min/{1.73_m2} (ref >59)

## 2023-08-21 LAB — CBC WITH DIFFERENTIAL/PLATELET
Basophils Absolute: 0.1 10*3/uL (ref 0.0–0.2)
Basos: 1 %
EOS (ABSOLUTE): 0.1 10*3/uL (ref 0.0–0.4)
Eos: 1 %
Hematocrit: 33.1 % — ABNORMAL LOW (ref 34.0–46.6)
Hemoglobin: 9.9 g/dL — ABNORMAL LOW (ref 11.1–15.9)
Immature Grans (Abs): 0 10*3/uL (ref 0.0–0.1)
Immature Granulocytes: 0 %
Lymphocytes Absolute: 1.5 10*3/uL (ref 0.7–3.1)
Lymphs: 23 %
MCH: 22.1 pg — ABNORMAL LOW (ref 26.6–33.0)
MCHC: 29.9 g/dL — ABNORMAL LOW (ref 31.5–35.7)
MCV: 74 fL — ABNORMAL LOW (ref 79–97)
Monocytes Absolute: 0.4 10*3/uL (ref 0.1–0.9)
Monocytes: 6 %
Neutrophils Absolute: 4.5 10*3/uL (ref 1.4–7.0)
Neutrophils: 69 %
Platelets: 371 10*3/uL (ref 150–450)
RBC: 4.48 x10E6/uL (ref 3.77–5.28)
RDW: 18.5 % — ABNORMAL HIGH (ref 11.7–15.4)
WBC: 6.5 10*3/uL (ref 3.4–10.8)

## 2023-08-21 LAB — LIPID PANEL
Chol/HDL Ratio: 3.7 ratio (ref 0.0–4.4)
Cholesterol, Total: 219 mg/dL — ABNORMAL HIGH (ref 100–199)
HDL: 60 mg/dL (ref >39)
LDL Chol Calc (NIH): 135 mg/dL — ABNORMAL HIGH (ref 0–99)
Triglycerides: 134 mg/dL (ref 0–149)
VLDL Cholesterol Cal: 24 mg/dL (ref 5–40)

## 2023-08-21 LAB — IRON,TIBC AND FERRITIN PANEL
Ferritin: 5 ng/mL — ABNORMAL LOW (ref 15–150)
Iron Saturation: 8 % — CL (ref 15–55)
Iron: 38 ug/dL (ref 27–159)
Total Iron Binding Capacity: 457 ug/dL — ABNORMAL HIGH (ref 250–450)
UIBC: 419 ug/dL (ref 131–425)

## 2023-08-21 LAB — TSH: TSH: 0.708 u[IU]/mL (ref 0.450–4.500)

## 2023-08-23 DIAGNOSIS — D649 Anemia, unspecified: Secondary | ICD-10-CM | POA: Insufficient documentation

## 2023-08-23 NOTE — Assessment & Plan Note (Signed)
The current medical regimen is effective;  continue present plan and medications.  

## 2023-08-23 NOTE — Assessment & Plan Note (Signed)
Well controlled.  No changes to medicines. crestor 20 mg daily Continue to work on eating a healthy diet and exercise

## 2023-08-23 NOTE — Assessment & Plan Note (Signed)
Well controlled.  ?No changes to medicines.  ?Continue to work on eating a healthy diet and exercise.  ?Labs drawn today.  ?

## 2023-08-23 NOTE — Assessment & Plan Note (Signed)
Check labs 

## 2023-08-23 NOTE — Assessment & Plan Note (Addendum)
Hemoglobin A1c 5.9%, 3 month avg of blood sugars, is in prediabetic range.  In order to prevent progression to diabetes, recommend low carb diet and regular exercise  

## 2023-08-24 ENCOUNTER — Other Ambulatory Visit: Payer: Self-pay

## 2023-08-24 DIAGNOSIS — M4714 Other spondylosis with myelopathy, thoracic region: Secondary | ICD-10-CM | POA: Diagnosis not present

## 2023-08-24 DIAGNOSIS — M4804 Spinal stenosis, thoracic region: Secondary | ICD-10-CM | POA: Diagnosis not present

## 2023-08-24 DIAGNOSIS — Z23 Encounter for immunization: Secondary | ICD-10-CM | POA: Insufficient documentation

## 2023-08-24 DIAGNOSIS — D508 Other iron deficiency anemias: Secondary | ICD-10-CM

## 2023-08-24 MED ORDER — ROSUVASTATIN CALCIUM 20 MG PO TABS: 20.0000 mg | ORAL_TABLET | Freq: Every day | ORAL | 0 refills | Status: DC

## 2023-08-24 MED ORDER — FERROUS GLUCONATE 324 (38 FE) MG PO TABS: 324.0000 mg | ORAL_TABLET | Freq: Every day | ORAL | 0 refills | Status: DC

## 2023-08-24 NOTE — Assessment & Plan Note (Signed)
I strongly recommend the patient discuss with psychiatry that she discontinued her medications.

## 2023-09-15 ENCOUNTER — Ambulatory Visit: Payer: Medicare HMO | Admitting: Family Medicine

## 2023-09-15 DIAGNOSIS — D508 Other iron deficiency anemias: Secondary | ICD-10-CM

## 2023-09-16 LAB — IRON,TIBC AND FERRITIN PANEL
Ferritin: 20 ng/mL (ref 15–150)
Iron Saturation: 7 % — CL (ref 15–55)
Iron: 29 ug/dL (ref 27–159)
Total Iron Binding Capacity: 392 ug/dL (ref 250–450)
UIBC: 363 ug/dL (ref 131–425)

## 2023-09-16 LAB — CBC WITH DIFFERENTIAL/PLATELET
Basophils Absolute: 0.1 10*3/uL (ref 0.0–0.2)
Basos: 1 %
EOS (ABSOLUTE): 0.2 10*3/uL (ref 0.0–0.4)
Eos: 2 %
Hematocrit: 34 % (ref 34.0–46.6)
Hemoglobin: 10.2 g/dL — ABNORMAL LOW (ref 11.1–15.9)
Immature Grans (Abs): 0 10*3/uL (ref 0.0–0.1)
Immature Granulocytes: 0 %
Lymphocytes Absolute: 1.5 10*3/uL (ref 0.7–3.1)
Lymphs: 15 %
MCH: 23.8 pg — ABNORMAL LOW (ref 26.6–33.0)
MCHC: 30 g/dL — ABNORMAL LOW (ref 31.5–35.7)
MCV: 79 fL (ref 79–97)
Monocytes Absolute: 0.7 10*3/uL (ref 0.1–0.9)
Monocytes: 7 %
Neutrophils Absolute: 7 10*3/uL (ref 1.4–7.0)
Neutrophils: 75 %
Platelets: 351 10*3/uL (ref 150–450)
RBC: 4.29 x10E6/uL (ref 3.77–5.28)
RDW: 19.9 % — ABNORMAL HIGH (ref 11.7–15.4)
WBC: 9.4 10*3/uL (ref 3.4–10.8)

## 2023-09-26 NOTE — Progress Notes (Signed)
Here for labs

## 2023-09-30 DIAGNOSIS — F3112 Bipolar disorder, current episode manic without psychotic features, moderate: Secondary | ICD-10-CM | POA: Diagnosis not present

## 2023-10-08 DIAGNOSIS — H524 Presbyopia: Secondary | ICD-10-CM | POA: Diagnosis not present

## 2023-10-08 DIAGNOSIS — H5213 Myopia, bilateral: Secondary | ICD-10-CM | POA: Diagnosis not present

## 2023-10-08 DIAGNOSIS — H52223 Regular astigmatism, bilateral: Secondary | ICD-10-CM | POA: Diagnosis not present

## 2023-10-08 DIAGNOSIS — Z135 Encounter for screening for eye and ear disorders: Secondary | ICD-10-CM | POA: Diagnosis not present

## 2023-11-26 ENCOUNTER — Other Ambulatory Visit: Payer: Self-pay | Admitting: Family Medicine

## 2023-11-26 DIAGNOSIS — D508 Other iron deficiency anemias: Secondary | ICD-10-CM

## 2024-01-11 ENCOUNTER — Ambulatory Visit: Payer: Self-pay | Admitting: Family Medicine

## 2024-01-11 NOTE — Telephone Encounter (Signed)
Agree with disposition. Dr. Sedalia Muta

## 2024-01-11 NOTE — Telephone Encounter (Signed)
Chief Complaint: Depression Symptoms: Depression, increase sleeping, decrease appetite, loss of interest Frequency: Constant Pertinent Negatives: Patient denies thoughts of harming others and thoughts or plan to harm self Disposition: [x] ED /[] Urgent Care (no appt availability in office) / [] Appointment(In office/virtual)/ []  [facility]/ [] Home Care/ [] Refused Recommended Disposition /[] [facility]/ []  Follow-up with PCP Additional Notes: Spoke to patient's daughter Suzette Battiest (signed DPR on file). Suzette Battiest stated that the patient has been depressed for a few months now without any improvement. Suzette Battiest stated that she spoke with the patient today and the patient stated " I wish my life would just end" she also has made comments like " I wish I could go to sleep and not wake up". The patient is currently out of state in [place] with her sister. Suzette Battiest stated she does not have a plan to harm herself and she does not have access to sleeping pills. Suzette Battiest states the patient is in poor health and has a troubled relationship with some of her child and it could be causing the depression. Care advice given and advised the patient be taking to the nearest ED at this time. Suzette Battiest stated she will communicate with her aunt to get her mom to the ED now. Advised to callback if further assistance is needed. Veronica verbalized understanding.   Copied from CRM [id number]. Topic: Clinical - Red Word Triage >> Jan 11, 2024 11:51 AM Fuller Mandril wrote: Red Word that prompted transfer to Nurse Triage: very deep depression not able to function. Caller not with patient Reason for Disposition  [1] Depression AND [2] unable to do any of normal activities (e.g., self care, school, work; in comparison to baseline).  Answer Assessment - Initial Assessment Questions 1. CONCERN: "What happened that made you call today?"     Spoke to patient and patient stated I wish my life would just end 2. DEPRESSION  SYMPTOM SCREENING: "How are you feeling overall?" (e.g., decreased energy, increased sleeping or difficulty sleeping, difficulty concentrating, feelings of sadness, guilt, hopelessness, or worthlessness)     Feeling sad, hopeless, increased sleeping and decreased energy 3. RISK OF HARM - SUICIDAL IDEATION:  "Do you ever have thoughts of hurting or killing yourself?"  (e.g., yes, no, no but preoccupation with thoughts about death)   - INTENT:  "Do you have thoughts of hurting or killing yourself right NOW?" (e.g., yes, no, N/A)   - PLAN: "Do you have a specific plan for how you would do this?" (e.g., gun, knife, overdose, no plan, N/A)     No 4. RISK OF HARM - HOMICIDAL IDEATION:  "Do you ever have thoughts of hurting or killing someone else?"  (e.g., yes, no, no but preoccupation with thoughts about death)   - INTENT:  "Do you have thoughts of hurting or killing someone right NOW?" (e.g., yes, no, N/A)   - PLAN: "Do you have a specific plan for how you would do this?" (e.g., gun, knife, no plan, N/A)      No  5. FUNCTIONAL IMPAIRMENT: "How have things been going for you overall? Have you had more difficulty than usual doing your normal daily activities?"  (e.g., better, same, worse; self-care, school, work, interactions)     Everything has been difficult recently  6. SUPPORT: "Who is with you now?" "Who do you live with?" "Do you have family or friends who you can talk to?"      She is in [place] with her sister right now  7. THERAPIST: "Do  you have a counselor or therapist? Name?"     Yes, Verlon Au at [facility]  8. STRESSORS: "Has there been any new stress or recent changes in your life?"     Troubled relationship with some of her children  9. ALCOHOL USE OR SUBSTANCE USE (DRUG USE): "Do you drink alcohol or use any illegal drugs?"     No  10. OTHER: "Do you have any other physical symptoms right now?" (e.g., fever)       Decrease appetite, body aches  Protocols used: Depression-A-AH

## 2024-01-13 DIAGNOSIS — F3112 Bipolar disorder, current episode manic without psychotic features, moderate: Secondary | ICD-10-CM | POA: Diagnosis not present

## 2024-02-10 DIAGNOSIS — F3112 Bipolar disorder, current episode manic without psychotic features, moderate: Secondary | ICD-10-CM | POA: Diagnosis not present

## 2024-02-17 ENCOUNTER — Ambulatory Visit: Payer: Medicare HMO | Admitting: Family Medicine

## 2024-02-24 DIAGNOSIS — F3112 Bipolar disorder, current episode manic without psychotic features, moderate: Secondary | ICD-10-CM | POA: Diagnosis not present

## 2024-02-28 ENCOUNTER — Other Ambulatory Visit: Payer: Self-pay | Admitting: Family Medicine

## 2024-02-28 DIAGNOSIS — D508 Other iron deficiency anemias: Secondary | ICD-10-CM

## 2024-03-16 DIAGNOSIS — F3112 Bipolar disorder, current episode manic without psychotic features, moderate: Secondary | ICD-10-CM | POA: Diagnosis not present

## 2024-03-30 DIAGNOSIS — F3112 Bipolar disorder, current episode manic without psychotic features, moderate: Secondary | ICD-10-CM | POA: Diagnosis not present

## 2024-04-03 NOTE — Progress Notes (Signed)
 Subjective:  Patient ID: Cynthia [name], female    DOB: Jun 03, 1961  Age: 63 y.o. MRN: [id number]  Chief Complaint  Patient presents with   Medical Management of Chronic Issues    HPI:  Hypercholesterolemia: Patient has been off her crestor  20 mg  HTN: Was taking losartan  50 mg but she stopped it.   Bipolar Depression: Patient is little unclear on medications she is taking. It sounds like she discontinued her depakote  500mg  q12 hours, but is taking zyprexa 15mg  daily. Patient is seeing Christell Cove, NP every other week via telehealth. Patient is very agitated today.   Patient had lumbar laminectomy 11/2022 and had been experiencing leg weakness. She uses a cane. Still seems off balance.       04/04/2024    1:16 PM 08/20/2023   10:50 AM 08/13/2023    2:46 PM 08/27/2022   10:24 AM 02/20/2022   11:02 AM  Depression screen PHQ 2/9  Decreased Interest 3  0 0 0  Down, Depressed, Hopeless 3  0 0 0  PHQ - 2 Score 6  0 0 0  Altered sleeping 0 0     Tired, decreased energy 0      Change in appetite 0 1     Feeling bad or failure about yourself  3 0     Trouble concentrating 3 1     Moving slowly or fidgety/restless 0 0     Suicidal thoughts 0 0     PHQ-9 Score 12            04/04/2024    1:18 PM  Fall Risk   Falls in the past year? 1  Number falls in past yr: 0  Injury with Fall? 0  Risk for fall due to : Impaired balance/gait;Impaired mobility  Follow up Falls evaluation completed    Patient Care Team: Mercy Stall, MD as PCP - General (Family Medicine) Rolm Clos Lauris Port (Nurse Practitioner)   Review of Systems  Constitutional:  Negative for chills, fatigue and fever.  HENT:  Negative for congestion, ear pain, rhinorrhea and sore throat.   Respiratory:  Negative for cough and shortness of breath.   Cardiovascular:  Negative for chest pain.  Gastrointestinal:  Negative for abdominal pain, constipation, diarrhea, nausea and vomiting.  Genitourinary:  Negative for dysuria  and urgency.  Musculoskeletal:  Negative for back pain and myalgias.  Neurological:  Positive for weakness. Negative for dizziness, light-headedness and headaches.  Psychiatric/Behavioral:  Positive for decreased concentration and dysphoric mood. The patient is nervous/anxious.     Current Outpatient Medications on File Prior to Visit  Medication Sig Dispense Refill   OLANZapine (ZYPREXA) 15 MG tablet Take 15 mg by mouth at bedtime.     albuterol  (VENTOLIN  HFA) 108 (90 Base) MCG/ACT inhaler Inhale 1-2 puffs into the lungs every 4 (four) hours as needed for wheezing or shortness of breath. (Patient not taking: Reported on 08/20/2023) 1 each 3   cholecalciferol  (VITAMIN D3) 25 MCG (1000 UNIT) tablet Take 1,000 Units by mouth daily. (Patient not taking: Reported on 08/20/2023)     divalproex  (DEPAKOTE ) 500 MG DR tablet Take 1 tablet (500 mg total) by mouth every 12 (twelve) hours. For mood stabilization (Patient not taking: Reported on 08/20/2023) 60 tablet 0   ferrous gluconate  (FERGON) 324 MG tablet Take 1 tablet by mouth once daily 90 tablet 0   No current facility-administered medications on file prior to visit.   Past Medical History:  Diagnosis Date  Bipolar 1 disorder (HCC)    Essential hypertension    GERD (gastroesophageal reflux disease)    Past Surgical History:  Procedure Laterality Date   LUMBAR LAMINECTOMY/DECOMPRESSION MICRODISCECTOMY N/A 12/01/2022   Procedure: THORACIC ONE, THORACIC TWO LAMINECTOMY/DECOMPRESSION FOR SYNOVIAL CYST RESECTION;  Surgeon: Audie Bleacher, MD;  Location: [facility] OR;  Service: Neurosurgery;  Laterality: N/A;    Family History  Problem Relation Age of Onset   Cancer Mother        lung   Cancer Father        prostate   Diabetes Paternal Grandfather    Social History   Socioeconomic History   Marital status: Divorced    Spouse name: Not on file   Number of children: 3   Years of education: Not on file   Highest education level: 12th grade   Occupational History   Not on file  Tobacco Use   Smoking status: Never   Smokeless tobacco: Never  Vaping Use   Vaping status: Never Used  Substance and Sexual Activity   Alcohol use: Not Currently    Comment: rarely   Drug use: Never   Sexual activity: Yes    Partners: Male  Other Topics Concern   Not on file  Social History Narrative   Not on file   Social Drivers of Health   Financial Resource Strain: Low Risk  (04/03/2024)   Overall Financial Resource Strain (CARDIA)    Difficulty of Paying Living Expenses: Not very hard  Food Insecurity: No Food Insecurity (04/03/2024)   Hunger Vital Sign    Worried About Running Out of Food in the Last Year: Never true    Ran Out of Food in the Last Year: Never true  Transportation Needs: No Transportation Needs (04/03/2024)   PRAPARE - Administrator, Civil Service (Medical): No    Lack of Transportation (Non-Medical): No  Physical Activity: Inactive (04/03/2024)   Exercise Vital Sign    Days of Exercise per Week: 0 days    Minutes of Exercise per Session: 30 min  Stress: Stress Concern Present (04/03/2024)   [employer] of Occupational Health - Occupational Stress Questionnaire    Feeling of Stress : Rather much  Social Connections: Socially Isolated (04/03/2024)   Social Connection and Isolation Panel [NHANES]    Frequency of Communication with Friends and Family: Once a week    Frequency of Social Gatherings with Friends and Family: Once a week    Attends Religious Services: Never    Database administrator or Organizations: No    Attends Engineer, structural: Never    Marital Status: Divorced    Objective:  BP 122/80   Pulse 86   Temp 97.9 F (36.6 C)   Ht 5\' 3"  (1.6 m)   Wt 217 lb (98.4 kg)   LMP  (LMP Unknown)   SpO2 96%   BMI 38.44 kg/m      04/04/2024    1:11 PM 08/20/2023   10:15 AM 08/13/2023    2:38 PM  BP/Weight  Systolic BP 122 138 --  Diastolic BP 80 78 --  Wt. (Lbs) 217 224  253  BMI 38.44 kg/m2 39.68 kg/m2 44.82 kg/m2    Physical Exam Vitals reviewed.  Constitutional:      Appearance: Normal appearance. She is normal weight.  Neck:     Vascular: No carotid bruit.  Cardiovascular:     Rate and Rhythm: Normal rate and regular rhythm.  Heart sounds: Normal heart sounds.  Pulmonary:     Effort: Pulmonary effort is normal. No respiratory distress.     Breath sounds: Normal breath sounds.  Abdominal:     General: Abdomen is flat. Bowel sounds are normal.     Palpations: Abdomen is soft.     Tenderness: There is no abdominal tenderness.  Neurological:     Mental Status: She is alert and oriented to person, place, and time.  Psychiatric:        Mood and Affect: Mood normal.        Behavior: Behavior is agitated.   Her agitation and shaking seemed to improve as she was walking towards the lab.   Diabetic Foot Exam - Simple   No data filed      Lab Results  Component Value Date   WBC 6.6 04/04/2024   HGB 12.4 04/04/2024   HCT 39.7 04/04/2024   PLT 254 04/04/2024   GLUCOSE 91 04/04/2024   CHOL 242 (H) 04/04/2024   TRIG 210 (H) 04/04/2024   HDL 48 04/04/2024   LDLCALC 156 (H) 04/04/2024   ALT 5 04/04/2024   AST 8 04/04/2024   NA 144 04/04/2024   K 4.4 04/04/2024   CL 106 04/04/2024   CREATININE 0.80 04/04/2024   BUN 8 04/04/2024   CO2 27 04/04/2024   TSH 0.823 04/04/2024   INR 1.0 12/01/2022   HGBA1C 5.4 04/04/2024      Assessment & Plan:  Primary hypertension Assessment & Plan: Well controlled.  Remain off antihypertensives for now. Continue to work on eating a healthy diet and exercise.  Labs drawn today.    Orders: -     CBC with Differential/Platelet -     Comprehensive metabolic panel with GFR  Gastroesophageal reflux disease without esophagitis Assessment & Plan: The current medical regimen is effective;  continue present plan and medications.   Mixed hyperlipidemia Assessment & Plan: Continue on crestor  20 mg  daily Continue to work on eating a healthy diet and exercise Labs drawn today  Orders: -     Lipid panel -     TSH  Prediabetes Assessment & Plan: Hemoglobin A1c 5.4%, 3 month avg of blood sugars, is in prediabetic range.  In order to prevent progression to diabetes, recommend low carb diet and regular exercise  Orders: -     Hemoglobin A1c  Other iron  deficiency anemia Assessment & Plan: Check labs  Orders: -     Iron , TIBC and Ferritin Panel  Bipolar I disorder, most recent episode (or current) depressed [facility]) Assessment & Plan: I strongly recommend the patient discuss with psychiatry that she discontinued her medications.      No orders of the defined types were placed in this encounter.   Orders Placed This Encounter  Procedures   CBC with Differential/Platelet   Comprehensive metabolic panel with GFR   Hemoglobin A1c   Lipid panel   TSH   Iron , TIBC and Ferritin Panel     Follow-up: No follow-ups on file.   I,Marla I Leal-Borjas,acting as a scribe for Mercy Stall, MD.,have documented all relevant documentation on the behalf of Mercy Stall, MD,as directed by  Mercy Stall, MD while in the presence of Mercy Stall, MD.   An After Visit Summary was printed and given to the patient.  I attest that I have reviewed this visit and agree with the plan scribed by my staff.   Mercy Stall, MD [facility] [phone]  Mercy Stall, MD [facility] [phone]

## 2024-04-04 ENCOUNTER — Ambulatory Visit (INDEPENDENT_AMBULATORY_CARE_PROVIDER_SITE_OTHER): Admitting: Family Medicine

## 2024-04-04 ENCOUNTER — Encounter: Payer: Self-pay | Admitting: Family Medicine

## 2024-04-04 VITALS — BP 122/80 | HR 86 | Temp 97.9°F | Ht 63.0 in | Wt 217.0 lb

## 2024-04-04 DIAGNOSIS — F3130 Bipolar disorder, current episode depressed, mild or moderate severity, unspecified: Secondary | ICD-10-CM

## 2024-04-04 DIAGNOSIS — I10 Essential (primary) hypertension: Secondary | ICD-10-CM | POA: Diagnosis not present

## 2024-04-04 DIAGNOSIS — K219 Gastro-esophageal reflux disease without esophagitis: Secondary | ICD-10-CM | POA: Diagnosis not present

## 2024-04-04 DIAGNOSIS — D508 Other iron deficiency anemias: Secondary | ICD-10-CM

## 2024-04-04 DIAGNOSIS — R7303 Prediabetes: Secondary | ICD-10-CM

## 2024-04-04 DIAGNOSIS — E782 Mixed hyperlipidemia: Secondary | ICD-10-CM

## 2024-04-05 LAB — CBC WITH DIFFERENTIAL/PLATELET
Basophils Absolute: 0 10*3/uL (ref 0.0–0.2)
Basos: 1 %
EOS (ABSOLUTE): 0.3 10*3/uL (ref 0.0–0.4)
Eos: 4 %
Hematocrit: 39.7 % (ref 34.0–46.6)
Hemoglobin: 12.4 g/dL (ref 11.1–15.9)
Immature Grans (Abs): 0 10*3/uL (ref 0.0–0.1)
Immature Granulocytes: 0 %
Lymphocytes Absolute: 1.5 10*3/uL (ref 0.7–3.1)
Lymphs: 23 %
MCH: 27.6 pg (ref 26.6–33.0)
MCHC: 31.2 g/dL — ABNORMAL LOW (ref 31.5–35.7)
MCV: 88 fL (ref 79–97)
Monocytes Absolute: 0.6 10*3/uL (ref 0.1–0.9)
Monocytes: 9 %
Neutrophils Absolute: 4.1 10*3/uL (ref 1.4–7.0)
Neutrophils: 63 %
Platelets: 254 10*3/uL (ref 150–450)
RBC: 4.49 x10E6/uL (ref 3.77–5.28)
RDW: 13.6 % (ref 11.7–15.4)
WBC: 6.6 10*3/uL (ref 3.4–10.8)

## 2024-04-05 LAB — TSH: TSH: 0.823 u[IU]/mL (ref 0.450–4.500)

## 2024-04-05 LAB — LIPID PANEL
Chol/HDL Ratio: 5 ratio — ABNORMAL HIGH (ref 0.0–4.4)
Cholesterol, Total: 242 mg/dL — ABNORMAL HIGH (ref 100–199)
HDL: 48 mg/dL (ref >39)
LDL Chol Calc (NIH): 156 mg/dL — ABNORMAL HIGH (ref 0–99)
Triglycerides: 210 mg/dL — ABNORMAL HIGH (ref 0–149)
VLDL Cholesterol Cal: 38 mg/dL (ref 5–40)

## 2024-04-05 LAB — IRON,TIBC AND FERRITIN PANEL
Ferritin: 13 ng/mL — ABNORMAL LOW (ref 15–150)
Iron Saturation: 17 % (ref 15–55)
Iron: 69 ug/dL (ref 27–159)
Total Iron Binding Capacity: 398 ug/dL (ref 250–450)
UIBC: 329 ug/dL (ref 131–425)

## 2024-04-05 LAB — COMPREHENSIVE METABOLIC PANEL WITH GFR
ALT: 5 IU/L (ref 0–32)
AST: 8 IU/L (ref 0–40)
Albumin: 3.9 g/dL (ref 3.8–4.9)
Alkaline Phosphatase: 74 IU/L (ref 44–121)
BUN/Creatinine Ratio: 10 — ABNORMAL LOW (ref 12–28)
BUN: 8 mg/dL (ref 8–27)
Bilirubin Total: 0.5 mg/dL (ref 0.0–1.2)
CO2: 27 mmol/L (ref 20–29)
Calcium: 8.9 mg/dL (ref 8.7–10.3)
Chloride: 106 mmol/L (ref 96–106)
Creatinine, Ser: 0.8 mg/dL (ref 0.57–1.00)
Globulin, Total: 2.5 g/dL (ref 1.5–4.5)
Glucose: 91 mg/dL (ref 70–99)
Potassium: 4.4 mmol/L (ref 3.5–5.2)
Sodium: 144 mmol/L (ref 134–144)
Total Protein: 6.4 g/dL (ref 6.0–8.5)
eGFR: 84 mL/min/{1.73_m2} (ref >59)

## 2024-04-05 LAB — HEMOGLOBIN A1C
Est. average glucose Bld gHb Est-mCnc: 108 mg/dL
Hgb A1c MFr Bld: 5.4 % (ref 4.8–5.6)

## 2024-04-06 ENCOUNTER — Other Ambulatory Visit: Payer: Self-pay

## 2024-04-06 ENCOUNTER — Encounter: Payer: Self-pay | Admitting: Family Medicine

## 2024-04-06 MED ORDER — ROSUVASTATIN CALCIUM 20 MG PO TABS: 20.0000 mg | ORAL_TABLET | Freq: Every day | ORAL | 1 refills | Status: DC

## 2024-04-06 NOTE — Assessment & Plan Note (Signed)
 Continue on crestor  20 mg daily Continue to work on eating a healthy diet and exercise Labs drawn today

## 2024-04-06 NOTE — Assessment & Plan Note (Signed)
 Well controlled.  Remain off antihypertensives for now. Continue to work on eating a healthy diet and exercise.  Labs drawn today.

## 2024-04-06 NOTE — Assessment & Plan Note (Signed)
 The current medical regimen is effective;  continue present plan and medications.

## 2024-04-06 NOTE — Assessment & Plan Note (Signed)
I strongly recommend the patient discuss with psychiatry that she discontinued her medications.

## 2024-04-06 NOTE — Assessment & Plan Note (Signed)
 Check labs

## 2024-04-06 NOTE — Assessment & Plan Note (Signed)
 Hemoglobin A1c 5.4%, 3 month avg of blood sugars, is in prediabetic range.  In order to prevent progression to diabetes, recommend low carb diet and regular exercise

## 2024-04-12 DIAGNOSIS — F3112 Bipolar disorder, current episode manic without psychotic features, moderate: Secondary | ICD-10-CM | POA: Diagnosis not present

## 2024-04-12 DIAGNOSIS — G8929 Other chronic pain: Secondary | ICD-10-CM | POA: Diagnosis not present

## 2024-04-12 DIAGNOSIS — R45851 Suicidal ideations: Secondary | ICD-10-CM | POA: Diagnosis not present

## 2024-04-12 DIAGNOSIS — M1990 Unspecified osteoarthritis, unspecified site: Secondary | ICD-10-CM | POA: Diagnosis not present

## 2024-04-12 DIAGNOSIS — Z6834 Body mass index (BMI) 34.0-34.9, adult: Secondary | ICD-10-CM | POA: Diagnosis not present

## 2024-04-12 DIAGNOSIS — F3130 Bipolar disorder, current episode depressed, mild or moderate severity, unspecified: Secondary | ICD-10-CM | POA: Diagnosis not present

## 2024-04-12 DIAGNOSIS — F339 Major depressive disorder, recurrent, unspecified: Secondary | ICD-10-CM | POA: Diagnosis not present

## 2024-04-12 DIAGNOSIS — Z62811 Personal history of psychological abuse in childhood: Secondary | ICD-10-CM | POA: Diagnosis not present

## 2024-04-12 DIAGNOSIS — Z9151 Personal history of suicidal behavior: Secondary | ICD-10-CM | POA: Diagnosis not present

## 2024-05-03 DIAGNOSIS — F3112 Bipolar disorder, current episode manic without psychotic features, moderate: Secondary | ICD-10-CM | POA: Diagnosis not present

## 2024-05-24 DIAGNOSIS — F3112 Bipolar disorder, current episode manic without psychotic features, moderate: Secondary | ICD-10-CM | POA: Diagnosis not present

## 2024-06-29 DIAGNOSIS — F3112 Bipolar disorder, current episode manic without psychotic features, moderate: Secondary | ICD-10-CM | POA: Diagnosis not present

## 2024-07-05 ENCOUNTER — Encounter: Payer: Self-pay | Admitting: Family Medicine

## 2024-07-05 ENCOUNTER — Ambulatory Visit (INDEPENDENT_AMBULATORY_CARE_PROVIDER_SITE_OTHER): Admitting: Family Medicine

## 2024-07-05 VITALS — BP 132/80 | HR 86 | Temp 97.8°F | Ht 63.0 in | Wt 229.0 lb

## 2024-07-05 DIAGNOSIS — E782 Mixed hyperlipidemia: Secondary | ICD-10-CM | POA: Diagnosis not present

## 2024-07-05 DIAGNOSIS — R7303 Prediabetes: Secondary | ICD-10-CM | POA: Diagnosis not present

## 2024-07-05 DIAGNOSIS — F3130 Bipolar disorder, current episode depressed, mild or moderate severity, unspecified: Secondary | ICD-10-CM | POA: Diagnosis not present

## 2024-07-05 DIAGNOSIS — Z79899 Other long term (current) drug therapy: Secondary | ICD-10-CM

## 2024-07-05 DIAGNOSIS — I10 Essential (primary) hypertension: Secondary | ICD-10-CM | POA: Diagnosis not present

## 2024-07-05 MED ORDER — DIVALPROEX SODIUM 500 MG PO DR TAB: 500.0000 mg | DELAYED_RELEASE_TABLET | Freq: Every evening | ORAL | Status: AC

## 2024-07-05 NOTE — Progress Notes (Unsigned)
 Subjective:  Patient ID: Cynthia [name], female    DOB: 1961-04-06  Age: 63 y.o. MRN: [id number]  Chief Complaint  Patient presents with   Medical Management of Chronic Issues    HPI: Hypercholesterolemia: Patient has been taking crestor  20 mg   HTN: Was taking losartan  50 mg but she stopped it.    Bipolar Depression: taking zyprexa 15mg  daily. Patient is seeing Sonny Cheney, NP every 6 weeks via telehealth.    Patient had lumbar laminectomy 11/2022 and had been experiencing leg weakness. She uses a cane. Still seems off balance.   Discussed the use of AI scribe software for clinical note transcription with the patient, who gave verbal consent to proceed.  History of Present Illness   Cynthia [name] is a 63 year old female with hypertension and depression who presents for a three-month follow-up.  Hypertension - Currently taking losartan  50 mg once daily - Has not restarted Crestor  for hyperlipidemia  Mood and psychiatric symptoms - Currently taking olanzapine (Zyprexa) 15 mg once daily and Depakote  (dose unknown) - Uncertain about risperidone dosage - Has trazodone 15 mg for sleep but is not currently taking it - No significant depressive symptoms or episodes of mania - No recent fevers, chills, sweats, earache, sore throat, stuffy nose, chest pain, abdominal pain, breathing issues, bladder issues, or bowel issues  Therapeutic drug monitoring - No recent Depakote  level checked and has never had one done before  Gynecologic screening - Has had a Pap smear in the past - Not interested in having a Pap smear today          07/05/2024    1:53 PM 04/04/2024    1:16 PM 08/20/2023   10:50 AM 08/13/2023    2:46 PM 08/27/2022   10:24 AM  Depression screen PHQ 2/9  Decreased Interest 1 3  0 0  Down, Depressed, Hopeless 0 3  0 0  PHQ - 2 Score 1 6  0 0  Altered sleeping 0 0 0    Tired, decreased energy 0 0     Change in appetite 0 0 1    Feeling bad or failure about  yourself  0 3 0    Trouble concentrating 1 3 1     Moving slowly or fidgety/restless 0 0 0    Suicidal thoughts 0 0 0    PHQ-9 Score 2 12     Difficult doing work/chores Not difficult at all            04/04/2024    1:18 PM  Fall Risk   Falls in the past year? 1  Number falls in past yr: 0  Injury with Fall? 0  Risk for fall due to : Impaired balance/gait;Impaired mobility  Follow up Falls evaluation completed    Patient Care Team: Sherre Clapper, MD as PCP - General (Family Medicine) Barbaraann Sonny BROCKS (Nurse Practitioner)   Review of Systems  Constitutional:  Negative for chills, fatigue and fever.  HENT:  Negative for congestion, ear pain, rhinorrhea and sore throat.   Respiratory:  Negative for cough and shortness of breath.   Cardiovascular:  Negative for chest pain.  Gastrointestinal:  Negative for abdominal pain, constipation, diarrhea, nausea and vomiting.  Genitourinary:  Negative for dysuria and urgency.  Musculoskeletal:  Negative for back pain and myalgias.  Neurological:  Negative for dizziness, weakness, light-headedness and headaches.  Psychiatric/Behavioral:  Negative for dysphoric mood. The patient is not nervous/anxious.     Current Outpatient Medications  on File Prior to Visit  Medication Sig Dispense Refill   buPROPion  (WELLBUTRIN  XL) 150 MG 24 hr tablet Take 150 mg by mouth daily.     DULoxetine (CYMBALTA) 30 MG capsule Take 30 mg by mouth daily.     ferrous gluconate  (FERGON) 324 MG tablet Take 1 tablet by mouth once daily 90 tablet 0   OLANZapine (ZYPREXA) 5 MG tablet Take 5 mg by mouth at bedtime.     No current facility-administered medications on file prior to visit.   Past Medical History:  Diagnosis Date   Bipolar 1 disorder (HCC)    Essential hypertension    GERD (gastroesophageal reflux disease)    Past Surgical History:  Procedure Laterality Date   LUMBAR LAMINECTOMY/DECOMPRESSION MICRODISCECTOMY N/A 12/01/2022   Procedure: THORACIC ONE,  THORACIC TWO LAMINECTOMY/DECOMPRESSION FOR SYNOVIAL CYST RESECTION;  Surgeon: Gillie Duncans, MD;  Location: [facility] OR;  Service: Neurosurgery;  Laterality: N/A;    Family History  Problem Relation Age of Onset   Cancer Mother        lung   Cancer Father        prostate   Diabetes Paternal Grandfather    Social History   Socioeconomic History   Marital status: Divorced    Spouse name: Not on file   Number of children: 3   Years of education: Not on file   Highest education level: Some college, no degree  Occupational History   Not on file  Tobacco Use   Smoking status: Never   Smokeless tobacco: Never  Vaping Use   Vaping status: Never Used  Substance and Sexual Activity   Alcohol use: Not Currently    Comment: rarely   Drug use: Never   Sexual activity: Yes    Partners: Male  Other Topics Concern   Not on file  Social History Narrative   Not on file   Social Drivers of Health   Financial Resource Strain: Low Risk  (07/01/2024)   Overall Financial Resource Strain (CARDIA)    Difficulty of Paying Living Expenses: Not hard at all  Food Insecurity: No Food Insecurity (07/01/2024)   Hunger Vital Sign    Worried About Running Out of Food in the Last Year: Never true    Ran Out of Food in the Last Year: Never true  Transportation Needs: No Transportation Needs (07/01/2024)   PRAPARE - Administrator, Civil Service (Medical): No    Lack of Transportation (Non-Medical): No  Physical Activity: Insufficiently Active (07/01/2024)   Exercise Vital Sign    Days of Exercise per Week: 2 days    Minutes of Exercise per Session: 20 min  Stress: No Stress Concern Present (07/01/2024)   [employer] of Occupational Health - Occupational Stress Questionnaire    Feeling of Stress: Not at all  Recent Concern: Stress - Stress Concern Present (04/03/2024)   [employer] of Occupational Health - Occupational Stress Questionnaire    Feeling of Stress : Rather much  Social  Connections: Moderately Isolated (07/01/2024)   Social Connection and Isolation Panel    Frequency of Communication with Friends and Family: Twice a week    Frequency of Social Gatherings with Friends and Family: Once a week    Attends Religious Services: Never    Database administrator or Organizations: Yes    Attends Engineer, structural: More than 4 times per year    Marital Status: Divorced    Objective:  BP 132/80   Pulse  86   Temp 97.8 F (36.6 C)   Ht 5' 3 (1.6 m)   Wt 229 lb (103.9 kg)   LMP  (LMP Unknown)   SpO2 98%   BMI 40.57 kg/m      07/05/2024    1:51 PM 04/04/2024    1:11 PM 08/20/2023   10:15 AM  BP/Weight  Systolic BP 132 122 138  Diastolic BP 80 80 78  Wt. (Lbs) 229 217 224  BMI 40.57 kg/m2 38.44 kg/m2 39.68 kg/m2    Physical Exam Vitals reviewed.  Constitutional:      Appearance: Normal appearance. She is obese.  Neck:     Vascular: No carotid bruit.  Cardiovascular:     Rate and Rhythm: Normal rate and regular rhythm.     Heart sounds: Normal heart sounds.  Pulmonary:     Effort: Pulmonary effort is normal. No respiratory distress.     Breath sounds: Normal breath sounds.  Abdominal:     General: Abdomen is flat. Bowel sounds are normal.     Palpations: Abdomen is soft.     Tenderness: There is no abdominal tenderness.  Neurological:     Mental Status: She is alert and oriented to person, place, and time.  Psychiatric:        Mood and Affect: Mood normal.        Behavior: Behavior normal.     {Perform Simple Foot Exam  Perform Detailed exam:1} {Insert foot Exam (Optional):30965}   Lab Results  Component Value Date   WBC 6.6 04/04/2024   HGB 12.4 04/04/2024   HCT 39.7 04/04/2024   PLT 254 04/04/2024   GLUCOSE 91 04/04/2024   CHOL 242 (H) 04/04/2024   TRIG 210 (H) 04/04/2024   HDL 48 04/04/2024   LDLCALC 156 (H) 04/04/2024   ALT 5 04/04/2024   AST 8 04/04/2024   NA 144 04/04/2024   K 4.4 04/04/2024   CL 106  04/04/2024   CREATININE 0.80 04/04/2024   BUN 8 04/04/2024   CO2 27 04/04/2024   TSH 0.823 04/04/2024   INR 1.0 12/01/2022   HGBA1C 5.4 04/04/2024      Assessment & Plan:  Medication management -     Free Valproic Acid  (Depakote )  Mixed hyperlipidemia -     Lipid panel  Prediabetes  Primary hypertension -     Comprehensive metabolic panel with GFR -     CBC with Differential/Platelet  Other orders -     Divalproex  Sodium; Take 1 tablet (500 mg total) by mouth at bedtime. For mood stabilization    Assessment and Plan    Major depressive disorder and bipolar disorder Mood stable, no depressive or manic episodes. On olanzapine 15 mg daily and Depakote , dose unclear. - Verify current medications and dosages after she retrieves them from the car. - Order Depakote  level.  Hypertension Blood pressure controlled at 132 mmHg on losartan  50 mg daily. - Continue losartan  50 mg once daily.  Hyperlipidemia Has not restarted Crestor .  General Health Maintenance Due for Pap smear. Discussed cervical cancer screening and stopping at age 56 if normal. - Schedule Pap smear in four months, around November 29th.  Recording duration: 7 minutes       Meds ordered this encounter  Medications   divalproex  (DEPAKOTE ) 500 MG DR tablet    Sig: Take 1 tablet (500 mg total) by mouth at bedtime. For mood stabilization    Orders Placed This Encounter  Procedures   Lipid panel  Comprehensive metabolic panel with GFR   CBC with Differential/Platelet   Free Valproic Acid  (Depakote )     Follow-up: Return in about 4 months (around 11/05/2024) for chronic follow up/Pap smear.   I,Katherina A Bramblett,acting as a scribe for Abigail Free, MD.,have documented all relevant documentation on the behalf of Abigail Free, MD,as directed by  Abigail Free, MD while in the presence of Abigail Free, MD.   An After Visit Summary was printed and given to the patient.  I attest that I have reviewed  this visit and agree with the plan scribed by my staff.   Abigail Free, MD [facility] [phone]

## 2024-07-07 ENCOUNTER — Ambulatory Visit: Payer: Self-pay | Admitting: Family Medicine

## 2024-07-07 DIAGNOSIS — Z79899 Other long term (current) drug therapy: Secondary | ICD-10-CM | POA: Insufficient documentation

## 2024-07-07 LAB — CBC WITH DIFFERENTIAL/PLATELET
Basophils Absolute: 0.1 x10E3/uL (ref 0.0–0.2)
Basos: 1 %
EOS (ABSOLUTE): 0.4 x10E3/uL (ref 0.0–0.4)
Eos: 6 %
Hematocrit: 37.1 % (ref 34.0–46.6)
Hemoglobin: 11.6 g/dL (ref 11.1–15.9)
Immature Grans (Abs): 0.1 x10E3/uL (ref 0.0–0.1)
Immature Granulocytes: 1 %
Lymphocytes Absolute: 1.8 x10E3/uL (ref 0.7–3.1)
Lymphs: 25 %
MCH: 28.1 pg (ref 26.6–33.0)
MCHC: 31.3 g/dL — ABNORMAL LOW (ref 31.5–35.7)
MCV: 90 fL (ref 79–97)
Monocytes Absolute: 0.5 x10E3/uL (ref 0.1–0.9)
Monocytes: 6 %
Neutrophils Absolute: 4.5 x10E3/uL (ref 1.4–7.0)
Neutrophils: 61 %
Platelets: 351 x10E3/uL (ref 150–450)
RBC: 4.13 x10E6/uL (ref 3.77–5.28)
RDW: 14.9 % (ref 11.7–15.4)
WBC: 7.3 x10E3/uL (ref 3.4–10.8)

## 2024-07-07 LAB — COMPREHENSIVE METABOLIC PANEL WITH GFR
ALT: 13 IU/L (ref 0–32)
AST: 17 IU/L (ref 0–40)
Albumin: 4.1 g/dL (ref 3.8–4.9)
Alkaline Phosphatase: 85 IU/L (ref 44–121)
BUN/Creatinine Ratio: 15 (ref 12–28)
BUN: 13 mg/dL (ref 8–27)
Bilirubin Total: 0.6 mg/dL (ref 0.0–1.2)
CO2: 27 mmol/L (ref 20–29)
Calcium: 9.5 mg/dL (ref 8.7–10.3)
Chloride: 101 mmol/L (ref 96–106)
Creatinine, Ser: 0.89 mg/dL (ref 0.57–1.00)
Globulin, Total: 2.8 g/dL (ref 1.5–4.5)
Glucose: 94 mg/dL (ref 70–99)
Potassium: 4.6 mmol/L (ref 3.5–5.2)
Sodium: 138 mmol/L (ref 134–144)
Total Protein: 6.9 g/dL (ref 6.0–8.5)
eGFR: 74 mL/min/1.73 (ref >59)

## 2024-07-07 LAB — LIPID PANEL
Chol/HDL Ratio: 5.1 ratio — ABNORMAL HIGH (ref 0.0–4.4)
Cholesterol, Total: 269 mg/dL — ABNORMAL HIGH (ref 100–199)
HDL: 53 mg/dL (ref >39)
LDL Chol Calc (NIH): 171 mg/dL — ABNORMAL HIGH (ref 0–99)
Triglycerides: 241 mg/dL — ABNORMAL HIGH (ref 0–149)
VLDL Cholesterol Cal: 45 mg/dL — ABNORMAL HIGH (ref 5–40)

## 2024-07-07 LAB — FREE VALPROIC ACID (DEPAKOTE): Valproic Acid, Free: NOT DETECTED ug/mL (ref 6.0–22.0)

## 2024-07-07 NOTE — Assessment & Plan Note (Signed)
 Hemoglobin A1c, 3 month avg of blood sugars, is in prediabetic range.  In order to prevent progression to diabetes, recommend low carb diet and regular exercise

## 2024-07-07 NOTE — Assessment & Plan Note (Signed)
 Recommend continue to work on eating healthy diet and exercise.

## 2024-07-07 NOTE — Assessment & Plan Note (Signed)
 Well controlled. Continue losartan  50 mg daily.  Continue to work on eating a healthy diet and exercise.  Labs drawn today.

## 2024-07-07 NOTE — Assessment & Plan Note (Signed)
 Recommended take crestor  20 mg daily Continue to work on eating a healthy diet and exercise Labs drawn today

## 2024-07-07 NOTE — Assessment & Plan Note (Signed)
 Check depakote level

## 2024-07-07 NOTE — Assessment & Plan Note (Signed)
 Mood stable, no depressive or manic episodes. On olanzapine 15 mg daily and Depakote , dose unclear. - Verify current medications and dosages after she retrieves them from the car. - Order Depakote  level. Management per specialist.

## 2024-07-12 MED ORDER — ROSUVASTATIN CALCIUM 20 MG PO TABS: 20.0000 mg | ORAL_TABLET | Freq: Every day | ORAL | 3 refills | Status: AC

## 2024-08-09 DIAGNOSIS — F3112 Bipolar disorder, current episode manic without psychotic features, moderate: Secondary | ICD-10-CM | POA: Diagnosis not present

## 2024-08-18 ENCOUNTER — Ambulatory Visit

## 2024-08-18 VITALS — Ht 63.0 in | Wt 229.0 lb

## 2024-08-18 DIAGNOSIS — Z0000 Encounter for general adult medical examination without abnormal findings: Secondary | ICD-10-CM

## 2024-08-18 NOTE — Patient Instructions (Signed)
 Ms. Kirshenbaum,  Thank you for taking the time for your Medicare Wellness Visit. I appreciate your continued commitment to your health goals. Please review the care plan we discussed, and feel free to reach out if I can assist you further.  Medicare recommends these wellness visits once per year to help you and your care team stay ahead of potential health issues. These visits are designed to focus on prevention, allowing your provider to concentrate on managing your acute and chronic conditions during your regular appointments.  Please note that Annual Wellness Visits do not include a physical exam. Some assessments may be limited, especially if the visit was conducted virtually. If needed, we may recommend a separate in-person follow-up with your provider.  Ongoing Care Seeing your primary care provider every 3 to 6 months helps us  monitor your health and provide consistent, personalized care.   Referrals If a referral was made during today's visit and you haven't received any updates within two weeks, please contact the referred provider directly to check on the status.  Recommended Screenings:  Health Maintenance  Topic Date Due   Pap with HPV screening  Never done   Zoster (Shingles) Vaccine (2 of 2) 07/01/2024   Flu Shot  07/08/2024   COVID-19 Vaccine (5 - Moderna risk 2024-25 season) 08/08/2024   Breast Cancer Screening  04/04/2025*   DTaP/Tdap/Td vaccine (2 - Td or Tdap) 12/04/2024   Medicare Annual Wellness Visit  08/18/2025   Colon Cancer Screening  01/23/2032   Pneumococcal Vaccine for age over 41  Completed   Hepatitis C Screening  Completed   HIV Screening  Completed   Hepatitis B Vaccine  Aged Out   HPV Vaccine  Aged Out   Meningitis B Vaccine  Aged Out  *Topic was postponed. The date shown is not the original due date.       08/18/2024    3:44 PM  Advanced Directives  Does Patient Have a Medical Advance Directive? No  Would patient like information on creating a  medical advance directive? Yes (MAU/Ambulatory/Procedural Areas - Information given)   Advance Care Planning is important because it: Ensures you receive medical care that aligns with your values, goals, and preferences. Provides guidance to your family and loved ones, reducing the emotional burden of decision-making during critical moments.  Information on Advanced Care Planning can be found at [place]  Secretary of [facility] Advance Health Care Directives Advance Health Care Directives ([email or web address]   Vision: Annual vision screenings are recommended for early detection of glaucoma, cataracts, and diabetic retinopathy. These exams can also reveal signs of chronic conditions such as diabetes and high blood pressure.  Dental: Annual dental screenings help detect early signs of oral cancer, gum disease, and other conditions linked to overall health, including heart disease and diabetes.  Please see the attached documents for additional preventive care recommendations.

## 2024-08-18 NOTE — Progress Notes (Signed)
 Subjective:   Cynthia [name] is a 63 y.o. who presents for a Medicare Wellness preventive visit.  As a reminder, Annual Wellness Visits don't include a physical exam, and some assessments may be limited, especially if this visit is performed virtually. We may recommend an in-person follow-up visit with your provider if needed.  Visit Complete: Virtual I connected with  Cynthia [name] on 08/18/24 by a audio enabled telemedicine application and verified that I am speaking with the correct person using two identifiers.  Patient Location: Home  Provider Location: Home Office  I discussed the limitations of evaluation and management by telemedicine. The patient expressed understanding and agreed to proceed.  Vital Signs: Because this visit was a virtual/telehealth visit, some criteria may be missing or patient reported. Any vitals not documented were not able to be obtained and vitals that have been documented are patient reported.  VideoDeclined- This patient declined Librarian, academic. Therefore the visit was completed with audio only.  Persons Participating in Visit: Patient.  AWV Questionnaire: No: Patient Medicare AWV questionnaire was not completed prior to this visit.  Cardiac Risk Factors include: obesity (BMI >30kg/m2)     Objective:    Today's Vitals   08/18/24 1537  Weight: 229 lb (103.9 kg)  Height: 5' 3 (1.6 m)   Body mass index is 40.57 kg/m.     08/18/2024    3:44 PM 08/13/2023    2:53 PM 04/29/2022    2:08 AM 08/18/2021    8:39 AM 08/29/2020    1:08 AM 08/26/2020    5:26 PM 07/04/2019    2:08 AM  Advanced Directives  Does Patient Have a Medical Advance Directive? No No No Yes  No Unable to assess, patient is non-responsive or altered mental status  Type of Advance Directive    Living will     Does patient want to make changes to medical advance directive?    No - Patient declined     Would patient like information on creating a  medical advance directive? Yes (MAU/Ambulatory/Procedural Areas - Information given) No - Patient declined    No - Patient declined      Information is confidential and restricted. Go to Review Flowsheets to unlock data.    Current Medications (verified) Outpatient Encounter Medications as of 08/18/2024  Medication Sig   buPROPion  (WELLBUTRIN  XL) 150 MG 24 hr tablet Take 150 mg by mouth daily.   divalproex  (DEPAKOTE ) 500 MG DR tablet Take 1 tablet (500 mg total) by mouth at bedtime. For mood stabilization   DULoxetine (CYMBALTA) 30 MG capsule Take 30 mg by mouth daily.   ferrous gluconate  (FERGON) 324 MG tablet Take 1 tablet by mouth once daily   OLANZapine (ZYPREXA) 5 MG tablet Take 5 mg by mouth at bedtime.   rosuvastatin  (CRESTOR ) 20 MG tablet Take 1 tablet (20 mg total) by mouth daily.   No facility-administered encounter medications on file as of 08/18/2024.    Allergies (verified) Patient has no known allergies.   History: Past Medical History:  Diagnosis Date   Bipolar 1 disorder (HCC)    Essential hypertension    GERD (gastroesophageal reflux disease)    Past Surgical History:  Procedure Laterality Date   LUMBAR LAMINECTOMY/DECOMPRESSION MICRODISCECTOMY N/A 12/01/2022   Procedure: THORACIC ONE, THORACIC TWO LAMINECTOMY/DECOMPRESSION FOR SYNOVIAL CYST RESECTION;  Surgeon: Gillie Duncans, MD;  Location: [facility] OR;  Service: Neurosurgery;  Laterality: N/A;   Family History  Problem Relation Age of Onset  Cancer Mother        lung   Cancer Father        prostate   Diabetes Paternal Grandfather    Diabetes Paternal Grandmother    Social History   Socioeconomic History   Marital status: Divorced    Spouse name: Not on file   Number of children: 3   Years of education: Not on file   Highest education level: Some college, no degree  Occupational History   Not on file  Tobacco Use   Smoking status: Never   Smokeless tobacco: Never  Vaping Use   Vaping status: Never  Used  Substance and Sexual Activity   Alcohol use: Not Currently    Comment: rarely   Drug use: Never   Sexual activity: Not Currently    Partners: Male  Other Topics Concern   Not on file  Social History Narrative   Not on file   Social Drivers of Health   Financial Resource Strain: Low Risk  (08/18/2024)   Overall Financial Resource Strain (CARDIA)    Difficulty of Paying Living Expenses: Not hard at all  Food Insecurity: No Food Insecurity (08/18/2024)   Hunger Vital Sign    Worried About Running Out of Food in the Last Year: Never true    Ran Out of Food in the Last Year: Never true  Transportation Needs: No Transportation Needs (08/18/2024)   PRAPARE - Administrator, Civil Service (Medical): No    Lack of Transportation (Non-Medical): No  Physical Activity: Insufficiently Active (08/18/2024)   Exercise Vital Sign    Days of Exercise per Week: 2 days    Minutes of Exercise per Session: 30 min  Stress: No Stress Concern Present (08/18/2024)   [employer] of Occupational Health - Occupational Stress Questionnaire    Feeling of Stress: Not at all  Social Connections: Moderately Isolated (08/18/2024)   Social Connection and Isolation Panel    Frequency of Communication with Friends and Family: Twice a week    Frequency of Social Gatherings with Friends and Family: Once a week    Attends Religious Services: Never    Database administrator or Organizations: Yes    Attends Engineer, structural: More than 4 times per year    Marital Status: Divorced    Tobacco Counseling Counseling given: Not Answered    Clinical Intake:  Pre-visit preparation completed: Yes  Pain : No/denies pain  Diabetes: No  Lab Results  Component Value Date   HGBA1C 5.4 04/04/2024   HGBA1C 5.9 (H) 08/25/2022   HGBA1C 6.0 (H) 05/23/2022     How often do you need to have someone help you when you read instructions, pamphlets, or other written materials from your doctor  or pharmacy?: 1 - Never  Interpreter Needed?: No  Information entered by :: Charmaine Bloodgood LPN   Activities of Daily Living     08/18/2024    3:44 PM  In your present state of health, do you have any difficulty performing the following activities:  Hearing? 0  Vision? 0  Difficulty concentrating or making decisions? 0  Walking or climbing stairs? 0  Dressing or bathing? 0  Doing errands, shopping? 0  Preparing Food and eating ? N  Using the Toilet? N  In the past six months, have you accidently leaked urine? N  Do you have problems with loss of bowel control? N  Managing your Medications? N  Managing your Finances? N  Housekeeping or  managing your Housekeeping? N    Patient Care Team: Sherre Clapper, MD as PCP - General (Family Medicine) Barbaraann Sonny BROCKS (Nurse Practitioner) Landrum Laurel, OD (Optometry) Colon Shove, MD as Consulting Physician (Neurosurgery)  I have updated your Care Teams any recent Medical Services you may have received from other providers in the past year.     Assessment:   This is a routine wellness examination for Bryant.  Hearing/Vision screen Hearing Screening - Comments:: Denies hearing difficulties   Vision Screening - Comments:: Wears rx glasses - up to date with routine eye exams with Dr. Laurel Landrum     Goals Addressed             This Visit's Progress    Maintain health   On track      Depression Screen     08/18/2024    3:43 PM 07/05/2024    1:53 PM 04/04/2024    1:16 PM 08/13/2023    2:46 PM 08/27/2022   10:24 AM 02/20/2022   11:02 AM 02/06/2022    2:19 PM  PHQ 2/9 Scores  PHQ - 2 Score 1 1 6  0 0 0 0  PHQ- 9 Score 2 2 12    3     Fall Risk     08/18/2024    3:44 PM 04/04/2024    1:18 PM 08/13/2023    9:29 AM 08/15/2022    8:58 AM 02/20/2022   11:02 AM  Fall Risk   Falls in the past year? 0 1 1 1 1   Number falls in past yr: 0 0 0 1 1  Injury with Fall? 0 0 0 0 0  Risk for fall due to : No Fall Risks Impaired  balance/gait;Impaired mobility  History of fall(s)   Follow up Falls prevention discussed;Education provided;Falls evaluation completed Falls evaluation completed  Falls evaluation completed;Education provided;Falls prevention discussed       Data saved with a previous flowsheet row definition    MEDICARE RISK AT HOME:  Medicare Risk at Home Any stairs in or around the home?: No If so, are there any without handrails?: No Home free of loose throw rugs in walkways, pet beds, electrical cords, etc?: Yes Adequate lighting in your home to reduce risk of falls?: Yes Life alert?: No Use of a cane, walker or w/c?: No Grab bars in the bathroom?: Yes Shower chair or bench in shower?: No Elevated toilet seat or a handicapped toilet?: Yes  TIMED UP AND GO:  Was the test performed?  No  Cognitive Function: 6CIT completed        08/18/2024    3:44 PM 08/13/2023    2:53 PM 08/18/2021    8:47 AM  6CIT Screen  What Year? 0 points 0 points 0 points  What month? 0 points 0 points 0 points  What time? 0 points 0 points 0 points  Count back from 20 0 points 0 points 0 points  Months in reverse 0 points 0 points 0 points  Repeat phrase 0 points 0 points 0 points  Total Score 0 points 0 points 0 points    Immunizations Immunization History  Administered Date(s) Administered   Influenza Inj Mdck Quad Pf 08/22/2020, 08/16/2021   Influenza, Mdck, Trivalent,PF 6+ MOS(egg free) 08/20/2023   Influenza,inj,Quad PF,6+ Mos 08/27/2022   Influenza-Unspecified 08/22/2019   Moderna Covid-19 Vaccine Bivalent Booster 65yrs & up 11/22/2021   Moderna Sars-Covid-2 Vaccination 03/26/2020, 04/23/2020   PNEUMOCOCCAL CONJUGATE-20 11/22/2021   Pfizer(Comirnaty)Fall Seasonal Vaccine 12 years  and older 08/20/2023   Tdap 12/04/2014   Zoster Recombinant(Shingrix) 05/06/2024    Screening Tests Health Maintenance  Topic Date Due   Cervical Cancer Screening (HPV/Pap Cotest)  Never done   Zoster Vaccines- Shingrix  (2 of 2) 07/01/2024   Influenza Vaccine  07/08/2024   COVID-19 Vaccine (5 - Moderna risk 2024-25 season) 08/08/2024   Mammogram  04/04/2025 (Originally 01/26/2020)   DTaP/Tdap/Td (2 - Td or Tdap) 12/04/2024   Medicare Annual Wellness (AWV)  08/18/2025   Colonoscopy  01/23/2032   Pneumococcal Vaccine: 50+ Years  Completed   Hepatitis C Screening  Completed   HIV Screening  Completed   Hepatitis B Vaccines 19-59 Average Risk  Aged Out   HPV VACCINES  Aged Out   Meningococcal B Vaccine  Aged Out    Health Maintenance Items Addressed: Information provided on vaccine recommendations; declines mammogram  Additional Screening:  Vision Screening: Recommended annual ophthalmology exams for early detection of glaucoma and other disorders of the eye. Is the patient up to date with their annual eye exam?  Yes  Who is the provider or what is the name of the office in which the patient attends annual eye exams? Dr. Josette Meadows   Dental Screening: Recommended annual dental exams for proper oral hygiene  Community Resource Referral / Chronic Care Management: CRR required this visit?  No   CCM required this visit?  No   Plan:    I have personally reviewed and noted the following in the patient's chart:   Medical and social history Use of alcohol, tobacco or illicit drugs  Current medications and supplements including opioid prescriptions. Patient is not currently taking opioid prescriptions. Functional ability and status Nutritional status Physical activity Advanced directives List of other physicians Hospitalizations, surgeries, and ER visits in previous 12 months Vitals Screenings to include cognitive, depression, and falls Referrals and appointments  In addition, I have reviewed and discussed with patient certain preventive protocols, quality metrics, and best practice recommendations. A written personalized care plan for preventive services as well as general preventive health  recommendations were provided to patient.   Lavelle Pfeiffer [place], [place]   [id number]   After Visit Summary: (MyChart) Due to this being a telephonic visit, the after visit summary with patients personalized plan was offered to patient via MyChart   Notes: Nothing significant to report at this time.

## 2024-10-04 DIAGNOSIS — F3112 Bipolar disorder, current episode manic without psychotic features, moderate: Secondary | ICD-10-CM | POA: Diagnosis not present

## 2024-11-08 DIAGNOSIS — F3112 Bipolar disorder, current episode manic without psychotic features, moderate: Secondary | ICD-10-CM | POA: Diagnosis not present

## 2024-11-09 ENCOUNTER — Other Ambulatory Visit: Payer: Self-pay | Admitting: Family Medicine

## 2024-11-09 ENCOUNTER — Ambulatory Visit: Admitting: Family Medicine

## 2024-11-09 ENCOUNTER — Encounter: Payer: Self-pay | Admitting: Family Medicine

## 2024-11-09 VITALS — BP 136/82 | HR 91 | Temp 98.2°F | Ht 63.0 in | Wt 250.0 lb

## 2024-11-09 DIAGNOSIS — F3130 Bipolar disorder, current episode depressed, mild or moderate severity, unspecified: Secondary | ICD-10-CM

## 2024-11-09 DIAGNOSIS — R7303 Prediabetes: Secondary | ICD-10-CM

## 2024-11-09 DIAGNOSIS — E782 Mixed hyperlipidemia: Secondary | ICD-10-CM

## 2024-11-09 DIAGNOSIS — Z124 Encounter for screening for malignant neoplasm of cervix: Secondary | ICD-10-CM | POA: Diagnosis not present

## 2024-11-09 DIAGNOSIS — Z23 Encounter for immunization: Secondary | ICD-10-CM

## 2024-11-09 LAB — POCT LIPID PANEL
HDL: 61
LDL: 110
Non-HDL: 149
TC: 209
TRG: 191

## 2024-11-09 LAB — POCT GLYCOSYLATED HEMOGLOBIN (HGB A1C): HbA1c POC (<> result, manual entry): 6 % (ref 4.0–5.6)

## 2024-11-09 NOTE — Assessment & Plan Note (Signed)
°  Orders:   POCT glycosylated hemoglobin (Hb A1C)

## 2024-11-09 NOTE — Progress Notes (Signed)
 Subjective:  Patient ID: Cynthia [name], female    DOB: 09/29/1961  Age: 63 y.o. MRN: [id number]  Chief Complaint  Patient presents with   Medical Management of Chronic Issues    W/Pap smear    HPI: Discussed the use of AI scribe software for clinical note transcription with the patient, who gave verbal consent to proceed.  History of Present Illness Cynthia [name] is a 63 year old female who presents with weight gain and medication adherence issues.  Weight gain - Gained approximately 20 pounds over the past three months and a total of 40 pounds since April - Attributes weight gain to unhealthy eating habits and reduced physical activity - Uncertain about dietary habits, stating, 'I wouldn't be gaining so much weight if I was eating healthy.' - Denies any new medications that could contribute to weight gain - Current medications have been stable for a long time  Medication adherence - Frequently forgets to take Crestor  - Does not forget to take other medications - A1c is 6, similar to previous reading of 5.9  Neurological symptoms - Feels 'swimmy headed,' attributed to not having eaten  General review of systems - No fevers, chills, sweats, sore throat, stuffy nose, chest pain, breathing problems, bowel or bladder issues, dizziness, headaches, vaginal discharge, itching, irritation, or bleeding  Laboratory findings - Previous issue with Depakote  level not showing up in tests       08/18/2024    3:43 PM 07/05/2024    1:53 PM 04/04/2024    1:16 PM 08/20/2023   10:50 AM 08/13/2023    2:46 PM  Depression screen PHQ 2/9  Decreased Interest 1 1 3   0  Down, Depressed, Hopeless 0 0 3  0  PHQ - 2 Score 1 1 6   0  Altered sleeping 0 0 0 0   Tired, decreased energy 0 0 0    Change in appetite 0 0 0 1   Feeling bad or failure about yourself  0 0 3 0   Trouble concentrating 1 1 3 1    Moving slowly or fidgety/restless 0 0 0 0   Suicidal thoughts 0 0 0 0   PHQ-9 Score 2  2   12      Difficult doing work/chores  Not difficult at all        Data saved with a previous flowsheet row definition        08/18/2024    3:44 PM  Fall Risk   Falls in the past year? 0  Number falls in past yr: 0  Injury with Fall? 0   Risk for fall due to : No Fall Risks  Follow up Falls prevention discussed;Education provided;Falls evaluation completed     Data saved with a previous flowsheet row definition    Patient Care Team: Sherre Clapper, MD as PCP - General (Family Medicine) Barbaraann Sonny BROCKS (Nurse Practitioner) Landrum Laurel, OD (Optometry) Colon Shove, MD as Consulting Physician (Neurosurgery)    Current Outpatient Medications on File Prior to Visit  Medication Sig Dispense Refill   buPROPion  (WELLBUTRIN  XL) 150 MG 24 hr tablet Take 150 mg by mouth daily.     divalproex  (DEPAKOTE ) 500 MG DR tablet Take 1 tablet (500 mg total) by mouth at bedtime. For mood stabilization     DULoxetine (CYMBALTA) 30 MG capsule Take 30 mg by mouth daily.     ferrous gluconate  (FERGON) 324 MG tablet Take 1 tablet by mouth once daily 90 tablet 0  OLANZapine (ZYPREXA) 5 MG tablet Take 5 mg by mouth at bedtime.     rosuvastatin  (CRESTOR ) 20 MG tablet Take 1 tablet (20 mg total) by mouth daily. 90 tablet 3   No current facility-administered medications on file prior to visit.   Past Medical History:  Diagnosis Date   Bipolar 1 disorder (HCC)    Essential hypertension    GERD (gastroesophageal reflux disease)    Past Surgical History:  Procedure Laterality Date   LUMBAR LAMINECTOMY/DECOMPRESSION MICRODISCECTOMY N/A 12/01/2022   Procedure: THORACIC ONE, THORACIC TWO LAMINECTOMY/DECOMPRESSION FOR SYNOVIAL CYST RESECTION;  Surgeon: Gillie Duncans, MD;  Location: [facility] OR;  Service: Neurosurgery;  Laterality: N/A;    Family History  Problem Relation Age of Onset   Cancer Mother        lung   Cancer Father        prostate   Diabetes Paternal Grandfather    Diabetes Paternal Grandmother     Social History   Socioeconomic History   Marital status: Divorced    Spouse name: Not on file   Number of children: 3   Years of education: Not on file   Highest education level: 12th grade  Occupational History   Not on file  Tobacco Use   Smoking status: Never   Smokeless tobacco: Never  Vaping Use   Vaping status: Never Used  Substance and Sexual Activity   Alcohol use: Not Currently    Comment: rarely   Drug use: Never   Sexual activity: Not Currently    Partners: Male  Other Topics Concern   Not on file  Social History Narrative   Not on file   Social Drivers of Health   Financial Resource Strain: Low Risk  (11/05/2024)   Overall Financial Resource Strain (CARDIA)    Difficulty of Paying Living Expenses: Not hard at all  Food Insecurity: No Food Insecurity (11/05/2024)   Hunger Vital Sign    Worried About Running Out of Food in the Last Year: Never true    Ran Out of Food in the Last Year: Never true  Transportation Needs: No Transportation Needs (11/05/2024)   PRAPARE - Administrator, Civil Service (Medical): No    Lack of Transportation (Non-Medical): No  Physical Activity: Insufficiently Active (11/05/2024)   Exercise Vital Sign    Days of Exercise per Week: 1 day    Minutes of Exercise per Session: 20 min  Stress: No Stress Concern Present (11/05/2024)   [employer] of Occupational Health - Occupational Stress Questionnaire    Feeling of Stress: Not at all  Social Connections: Socially Isolated (11/05/2024)   Social Connection and Isolation Panel    Frequency of Communication with Friends and Family: Once a week    Frequency of Social Gatherings with Friends and Family: Once a week    Attends Religious Services: Never    Database Administrator or Organizations: No    Attends Engineer, Structural: Not on file    Marital Status: Divorced    Objective:  BP 136/82   Pulse 91   Temp 98.2 F (36.8 C)   Ht 5' 3 (1.6 m)    Wt 250 lb (113.4 kg)   LMP  (LMP Unknown)   SpO2 98%   BMI 44.29 kg/m      11/09/2024    1:20 PM 08/18/2024    3:37 PM 07/05/2024    1:51 PM  BP/Weight  Systolic BP 136 -- 132  Diastolic BP  82 -- 80  Wt. (Lbs) 250 229 229  BMI 44.29 kg/m2 40.57 kg/m2 40.57 kg/m2    Physical Exam Vitals reviewed. Exam conducted with a chaperone present.  Constitutional:      Appearance: Normal appearance. She is obese.  Neck:     Vascular: No carotid bruit.  Cardiovascular:     Rate and Rhythm: Normal rate and regular rhythm.     Heart sounds: Normal heart sounds.  Pulmonary:     Effort: Pulmonary effort is normal. No respiratory distress.     Breath sounds: Normal breath sounds.  Abdominal:     General: Abdomen is flat. Bowel sounds are normal.     Palpations: Abdomen is soft.     Tenderness: There is no abdominal tenderness.  Genitourinary:    Exam position: Lithotomy position.     Vagina: Normal.     Cervix: Normal.  Neurological:     Mental Status: She is alert and oriented to person, place, and time.  Psychiatric:        Mood and Affect: Mood normal.        Behavior: Behavior normal.         Lab Results  Component Value Date   WBC 7.3 07/05/2024   HGB 11.6 07/05/2024   HCT 37.1 07/05/2024   PLT 351 07/05/2024   GLUCOSE 94 07/05/2024   CHOL 269 (H) 07/05/2024   TRIG 241 (H) 07/05/2024   HDL 53 07/05/2024   LDLCALC 171 (H) 07/05/2024   ALT 13 07/05/2024   AST 17 07/05/2024   NA 138 07/05/2024   K 4.6 07/05/2024   CL 101 07/05/2024   CREATININE 0.89 07/05/2024   BUN 13 07/05/2024   CO2 27 07/05/2024   TSH 0.823 04/04/2024   INR 1.0 12/01/2022   HGBA1C 6.0 11/09/2024    Results for orders placed or performed in visit on 11/09/24  POCT glycosylated hemoglobin (Hb A1C)   Collection Time: 11/09/24  1:43 PM  Result Value Ref Range   Hemoglobin A1C     HbA1c POC (<> result, manual entry) 6.0 4.0 - 5.6 %   HbA1c, POC (prediabetic range)     HbA1c, POC (controlled  diabetic range)    POCT Lipid Panel   Collection Time: 11/09/24  1:46 PM  Result Value Ref Range   TC 209    HDL 61    TRG 191    LDL 110    Non-HDL 149    TC/HDL    .  Assessment & Plan:   Assessment & Plan Mixed hyperlipidemia Elevated cholesterol, triglycerides, and LDL due to non-adherence to Crestor  and lifestyle factors. - Recommended consultation with a nutritionist. - recommended call insurance to see which nutritionist is covered.  - Recommend take crestor  daily.   Orders:   POCT Lipid Panel  Prediabetes Check level. Recommend work on eating healthy diet and exercise.  Orders:   POCT glycosylated hemoglobin (Hb A1C)  Screening for cervical cancer Check pap. Orders:   IGP, Aptima HPV, rfx 16/18,45  Encounter for immunization  Orders:   Flu vaccine, recombinant, trivalent, inj  Encounter for immunization  Orders:   Pfizer Comirnaty Covid-19 Vaccine 83yrs & older  Obesity, morbid (HCC) Recommend eat healthy and exercise.  Call nutritionist to set up appointment through your insurance.     Bipolar I disorder, most recent episode (or current) depressed (HCC) The current medical regimen is effective;  continue present plan and medications. Management per specialist.  Body mass index is 44.29 kg/m.    No orders of the defined types were placed in this encounter.   Orders Placed This Encounter  Procedures   Flu vaccine, recombinant, trivalent, inj   Pfizer Comirnaty Covid-19 Vaccine 39yrs & older   POCT glycosylated hemoglobin (Hb A1C)   POCT Lipid Panel    Follow-up: Return in about 3 months (around 02/07/2025) for chronic fasting.  An After Visit Summary was printed and given to the patient.  Abigail Free, MD [facility] [phone]

## 2024-11-09 NOTE — Patient Instructions (Signed)
 TAKE CRESTOR  20 MG ONE at bedtime

## 2024-11-09 NOTE — Assessment & Plan Note (Addendum)
  Orders:   Pfizer Comirnaty Covid-19 Vaccine 81yrs & older

## 2024-11-09 NOTE — Assessment & Plan Note (Signed)
°  Orders:   POCT Lipid Panel

## 2024-11-11 ENCOUNTER — Ambulatory Visit: Payer: Self-pay | Admitting: Family Medicine

## 2024-11-11 LAB — IGP, APTIMA HPV, RFX 16/18,45
HPV Aptima: NEGATIVE
PAP Smear Comment: 0

## 2024-11-13 NOTE — Assessment & Plan Note (Signed)
 Recommend eat healthy and exercise.  Call nutritionist to set up appointment through your insurance.

## 2024-11-13 NOTE — Assessment & Plan Note (Signed)
The current medical regimen is effective;  continue present plan and medications. Management per specialist.   

## 2025-02-10 ENCOUNTER — Ambulatory Visit: Admitting: Family Medicine

## 2025-03-07 ENCOUNTER — Ambulatory Visit: Admitting: Family Medicine

## 2025-08-24 ENCOUNTER — Ambulatory Visit
